# Patient Record
Sex: Female | Born: 1935 | Race: White
Health system: Southern US, Community
[De-identification: ages and names within clinical notes are randomized; demographics above are authoritative.]

## PROBLEM LIST (undated history)

## (undated) DIAGNOSIS — I10 Essential (primary) hypertension: Secondary | ICD-10-CM

## (undated) DIAGNOSIS — J449 Chronic obstructive pulmonary disease, unspecified: Secondary | ICD-10-CM

## (undated) DIAGNOSIS — R030 Elevated blood-pressure reading, without diagnosis of hypertension: Secondary | ICD-10-CM

## (undated) DIAGNOSIS — J189 Pneumonia, unspecified organism: Secondary | ICD-10-CM

## (undated) DIAGNOSIS — I4891 Unspecified atrial fibrillation: Secondary | ICD-10-CM

## (undated) DIAGNOSIS — M1990 Unspecified osteoarthritis, unspecified site: Secondary | ICD-10-CM

## (undated) HISTORY — PX: HYSTERECTOMY: SHX81

## (undated) HISTORY — PX: OTHER SURGICAL HISTORY: SHX169

## (undated) HISTORY — PX: CHOLECYSTECTOMY: SHX55

## (undated) HISTORY — PX: APPENDECTOMY: SHX54

## (undated) HISTORY — PX: ABDOMINAL HYSTERECTOMY: SHX81

---

## 1994-06-24 ENCOUNTER — Ambulatory Visit
Admission: RE | Admit: 1994-06-24 | Disposition: A | Discharge status: Home / Self Care | Payer: Self-pay | Source: Ambulatory Visit | Admitting: Specialist

## 2003-11-04 ENCOUNTER — Ambulatory Visit
Admit: 2003-11-04 | Disposition: A | Discharge status: Home / Self Care | Payer: Self-pay | Source: Ambulatory Visit | Admitting: Internal Medicine

## 2004-11-30 ENCOUNTER — Ambulatory Visit
Admit: 2004-11-30 | Disposition: A | Discharge status: Home / Self Care | Payer: Self-pay | Source: Ambulatory Visit | Admitting: Internal Medicine

## 2005-06-18 ENCOUNTER — Ambulatory Visit (HOSPITAL_COMMUNITY): Admission: RE | Admit: 2005-06-18 | Discharge: 2005-06-19 | Payer: Self-pay | Admitting: General Surgery

## 2005-07-14 ENCOUNTER — Emergency Department: Admit: 2005-07-14 | Payer: Self-pay | Source: Emergency Department | Admitting: Emergency Medicine

## 2005-07-14 LAB — COMPREHENSIVE METABOLIC PANEL
ALT: 25 U/L (ref 4–36)
AST (SGOT): 23 U/L (ref 10–41)
Albumin/Globulin Ratio: 1.6 (ref 1.1–1.8)
Albumin: 4.2 G/DL (ref 3.4–4.9)
Alkaline Phosphatase: 88 U/L (ref 43–112)
BUN: 22 MG/DL — ABNORMAL HIGH (ref 7–21)
Bilirubin, Total: 0.3 MG/DL (ref 0.2–1.0)
CO2: 18 MEQ/L — ABNORMAL LOW (ref 22–31)
Calcium: 9.6 MG/DL (ref 8.6–10.2)
Chloride: 103 MEQ/L (ref 98–107)
Creatinine: 1 MG/DL (ref 0.5–1.4)
Globulin: 2.7 G/DL (ref 2.0–3.7)
Glucose: 150 MG/DL — ABNORMAL HIGH (ref 65–110)
Potassium: 3.3 MEQ/L — ABNORMAL LOW (ref 3.6–5.0)
Protein, Total: 6.9 G/DL (ref 6.0–8.0)
Sodium: 139 MEQ/L (ref 136–143)

## 2005-07-14 LAB — TROPONIN I: Troponin I: <0.08 NG/ML

## 2005-07-14 LAB — CBC WITH AUTO DIFFERENTIAL CERNER
Basophils Absolute: 0.1 /mm3 (ref 0.0–0.2)
Basophils: 1 % (ref 0–2)
Eosinophils Absolute: 0.2 /mm3 (ref 0.0–0.7)
Eosinophils: 3 % (ref 0–5)
Granulocytes Absolute: 2.7 /mm3 (ref 1.8–8.1)
Hematocrit: 38.2 % (ref 37.0–47.0)
Hgb: 13.1 G/DL (ref 12.0–16.0)
Lymphocytes Absolute: 3.8 /mm3 (ref 0.5–4.4)
Lymphocytes: 51 % — ABNORMAL HIGH (ref 15–41)
MCH: 31.3 PG (ref 28.0–32.0)
MCHC: 34.3 G/DL (ref 32.0–36.0)
MCV: 91 FL (ref 80.0–100.0)
MPV: 7.3 FL — ABNORMAL LOW (ref 7.4–10.4)
Monocytes Absolute: 0.7 /mm3 (ref 0.0–1.2)
Monocytes: 10 % (ref 0–11)
Neutrophils %: 36 % — ABNORMAL LOW (ref 52–75)
Platelets: 424 /mm3 — ABNORMAL HIGH (ref 140–400)
RBC: 4.2 /mm3 (ref 4.20–5.40)
RDW: 13.1 % (ref 11.5–15.0)
WBC: 7.5 /mm3 (ref 3.5–10.8)

## 2005-07-14 LAB — URINALYSIS WITH MICROSCOPIC
Bilirubin, UA: NEGATIVE
Blood, UA: NEGATIVE
Glucose, UA: NEGATIVE
Ketones UA: NEGATIVE
Leukocyte Esterase, UA: NEGATIVE
Nitrite, UA: NEGATIVE
Protein, UR: NEGATIVE
Specific Gravity UA POCT: 1.02 (ref <=1.030)
Urine pH: 5.5 (ref 5.0–8.0)
Urobilinogen, UA: 0.2

## 2005-07-14 LAB — CKMB MASS CERNER: CKMB Mass: 1.8 ng/mL (ref 0.0–5.0)

## 2005-07-14 LAB — CREATINE KINASE W/O REFLEX (SOFT): Creatine Kinase (CK): 130 U/L (ref 20–140)

## 2005-07-14 LAB — PT/INR
PT INR: 0.8 {INR} — ABNORMAL LOW (ref 0.9–1.1)
PT: 9.5 s — ABNORMAL LOW (ref 10.8–13.3)

## 2005-07-14 LAB — APTT: PTT: 22 s (ref 21–32)

## 2005-07-29 ENCOUNTER — Ambulatory Visit
Admit: 2005-07-29 | Disposition: A | Discharge status: Home / Self Care | Payer: Self-pay | Source: Ambulatory Visit | Admitting: Orthopaedic Surgery

## 2005-08-11 ENCOUNTER — Ambulatory Visit
Admit: 2005-08-11 | Disposition: A | Discharge status: Home / Self Care | Payer: Self-pay | Source: Ambulatory Visit | Admitting: Orthopaedic Surgery

## 2005-08-25 ENCOUNTER — Ambulatory Visit
Admit: 2005-08-25 | Disposition: A | Discharge status: Home / Self Care | Payer: Self-pay | Source: Ambulatory Visit | Admitting: Anesthesiology

## 2005-08-27 ENCOUNTER — Ambulatory Visit
Admit: 2005-08-27 | Disposition: A | Discharge status: Home / Self Care | Payer: Self-pay | Source: Ambulatory Visit | Admitting: Urology

## 2005-08-27 LAB — CREATININE, SERUM: Creatinine: 0.7 MG/DL (ref 0.5–1.4)

## 2005-08-27 LAB — BUN: BUN: 13 MG/DL (ref 7–21)

## 2005-09-03 ENCOUNTER — Ambulatory Visit
Admit: 2005-09-03 | Disposition: A | Discharge status: Home / Self Care | Payer: Self-pay | Source: Ambulatory Visit | Admitting: Anesthesiology

## 2006-12-22 ENCOUNTER — Ambulatory Visit
Admit: 2006-12-22 | Disposition: A | Discharge status: Home / Self Care | Payer: Self-pay | Source: Ambulatory Visit | Admitting: Gynecologic Oncology

## 2007-03-20 ENCOUNTER — Ambulatory Visit
Admit: 2007-03-20 | Disposition: A | Discharge status: Home / Self Care | Payer: Self-pay | Source: Ambulatory Visit | Admitting: Internal Medicine

## 2007-03-20 LAB — CBC WITH AUTO DIFFERENTIAL CERNER
Basophils Absolute: 0 /mm3 (ref 0.0–0.2)
Basophils: 0 % (ref 0–2)
Eosinophils Absolute: 0.2 /mm3 (ref 0.0–0.2)
Eosinophils: 3 % (ref 0–5)
Granulocytes Absolute: 3.3 /mm3 (ref 1.8–8.1)
Hematocrit: 37.2 % (ref 37.0–47.0)
Hgb: 12.9 G/DL (ref 12.0–16.0)
Lymphocytes Absolute: 2.1 /mm3 (ref 0.5–4.4)
Lymphocytes: 35 % (ref 15–41)
MCH: 30.5 PG (ref 28.0–32.0)
MCHC: 34.7 G/DL (ref 32.0–36.0)
MCV: 88 FL (ref 80.0–100.0)
MPV: 7.5 FL (ref 7.4–10.4)
Monocytes Absolute: 0.4 /mm3 (ref 0.0–1.2)
Monocytes: 7 % (ref 0–11)
Neutrophils %: 54 % (ref 52–75)
Platelets: 378 /mm3 (ref 140–400)
RBC: 4.22 /mm3 (ref 4.20–5.40)
RDW: 13.2 % (ref 11.5–15.0)
WBC: 6.1 /mm3 (ref 3.5–10.8)

## 2007-03-20 LAB — LIPID STUDY FASTING AM CERNER
Cholesterol / HDL Ratio: 3.17 RATIO (ref ?–4.47)
Cholesterol: 187 mg/dL (ref 100–199)
HDL: 59 mg/dL — ABNORMAL LOW (ref >65)
LDL: 101 mg/dL (ref ?–130)
Triglycerides: 134 md/dL (ref 10–190)
VLDL: 27 mg/dL (ref 0–40)

## 2007-03-20 LAB — COMPREHENSIVE METABOLIC PANEL - AH CERNER
ALT: 22 U/L (ref 0–31)
AST (SGOT): 21 U/L (ref 0–31)
Albumin/Globulin Ratio: 1.5 (ref 1.1–2.2)
Albumin: 4 g/dL (ref 3.4–4.8)
Alkaline Phosphatase: 74 U/L (ref 35–104)
Anion Gap: 5 mEq/L (ref 5–15)
BUN: 16 mg/dL (ref 8–23)
Bilirubin, Total: 0.4 mg/dL (ref 0.0–1.0)
CA: 4.4 mEq/L (ref 3.8–4.6)
CO2: 29.3 mEq/L (ref 23.0–32.5)
Calcium: 9.7 mg/dL (ref 8.8–10.2)
Chloride: 106 mEq/L (ref 96–108)
Creatinine: 0.7 mg/dL (ref 0.4–1.1)
Globulin: 2.7 g/dL (ref 2.0–3.6)
Glucose: 96 mg/dL
Osmolality Calculated: 291 mosm/kg (ref 282–298)
Potassium: 5.2 mEq/L — ABNORMAL HIGH (ref 3.3–5.1)
Protein, Total: 6.7 g/dL (ref 6.4–8.3)
Sodium: 140 mEq/L (ref 133–145)
UN/CREA SOFT: 23 RATIO (ref 6–33)

## 2007-03-20 LAB — PT/INR
PT INR: 1 {INR} (ref 0.9–1.1)
PT: 11.5 s (ref 10.8–13.3)

## 2007-03-20 LAB — T4: T4: 7.5 ug/dL (ref 5.6–11.0)

## 2007-03-20 LAB — HEMOLYSIS INDEX: Hemolysis Index: 0 Units

## 2007-03-20 LAB — TSH: TSH: 5.23 u[IU]/mL — ABNORMAL HIGH (ref 0.400–4.610)

## 2007-03-20 LAB — SEDIMENTATION RATE: Sed Rate: 3 MM/HR (ref 0–20)

## 2007-03-20 LAB — GFR

## 2007-12-07 ENCOUNTER — Ambulatory Visit: Admit: 2007-12-07 | Disposition: A | Discharge status: Home / Self Care | Payer: Self-pay | Source: Ambulatory Visit

## 2008-03-15 ENCOUNTER — Ambulatory Visit
Admit: 2008-03-15 | Disposition: A | Discharge status: Home / Self Care | Payer: Self-pay | Source: Ambulatory Visit | Admitting: Gastroenterology

## 2008-06-05 ENCOUNTER — Ambulatory Visit: Admission: RE | Admit: 2008-06-05 | Payer: Self-pay | Source: Ambulatory Visit | Admitting: Gastroenterology

## 2011-04-23 LAB — ECG 12-LEAD
Atrial Rate: 59 {beats}/min
P Axis: 58 degrees
P-R Interval: 178 ms
Q-T Interval: 422 ms
QRS Duration: 90 ms
QTC Calculation (Bezet): 414 ms
R Axis: 47 degrees
T Axis: 32 degrees
Ventricular Rate: 58 {beats}/min

## 2011-04-30 NOTE — Op Note (Signed)
Account Number: [id number]      Document ID: [id number]      Admit Date: 06/05/2008      Procedure Date: 06/05/2008            Patient Location: [facility]      Patient Type: A            SURGEON: Ena Dawley MD      ASSISTANT:                  PREOPERATIVE DIAGNOSIS:      Persistent diarrhea.  Patient never had colonoscopy.            POSTOPERATIVE DIAGNOSES:      Diverticulosis coli, diminutive cecal polyp.            TITLE OF PROCEDURE:      Complete colonoscopy with hot biopsy polypectomy.            ANESTHESIA:      Dr. Ardelle Anton.            COMPLICATIONS:      None.            INFORMED CONSENT:      Obtained prior to the procedure.  I discussed with the patient in detail      indications, techniques of endoscopic procedure.  She understands well and      consents.            PROCEDURE DESCRIPTION:      The patient was placed in the left lateral decubitus position.  A rectal      examination was done which showed internal hemorrhoids.  The patient had      excellent preparation.  Cecal withdrawal time was 7 minutes and colonoscope      was then advanced into the cecum without any difficulty.            Cecum was fully intubated and appeared to have normal appendiceal opening      and ileocecal valve.  A diminutive 3-mm polyp was seen in the cecum.  This      was excised with the help of hot biopsy forceps.  Cautery setting used was      15 watts.            Ascending colon, hepatic and splenic flexures, transverse colon, and      descending colon were all normal.            A small few diverticula were seen in the sigmoid colon.  There was no      evidence of any acute diverticulitis.  Examination of the rectum revealed      internal hemorrhoids.  No complications occurred.            RECOMMENDATIONS:      Regular diet, follow up on the biopsies, continue with Zocor.                        Electronic Signing Provider      _______________________________     Date/Time Signed: _____________      Ena Dawley MD  [phone])            D:  06/05/2008 09:54 AM by Ena Dawley, MD [phone])      T:  06/05/2008 10:37 AM by [id number]          (Conf: [id number]) (Doc ID: [id number])  ZO:[email or web address] Revonda Standard MD

## 2011-08-25 ENCOUNTER — Ambulatory Visit: Payer: Self-pay

## 2011-08-25 ENCOUNTER — Ambulatory Visit
Admission: RE | Admit: 2011-08-25 | Payer: Self-pay | Source: Ambulatory Visit | Attending: Gastroenterology | Admitting: Gastroenterology

## 2012-03-15 ENCOUNTER — Inpatient Hospital Stay
Admission: RE | Admit: 2012-03-15 | Discharge: 2012-03-15 | Disposition: A | Discharge status: Home / Self Care | Payer: Medicare Other | Source: Ambulatory Visit | Attending: Gastroenterology | Admitting: Gastroenterology

## 2012-03-15 DIAGNOSIS — R197 Diarrhea, unspecified: Secondary | ICD-10-CM | POA: Insufficient documentation

## 2012-03-15 LAB — STOOL FOR WBC
Eosinophils Wright Stain: NONE SEEN
Lymphocytes Wright Stain: NONE SEEN
Neutrophils Wright Stain: NONE SEEN
RBC Wright Stain: NONE SEEN

## 2012-03-22 LAB — STOOL OVA AND PARASITE SMEAR

## 2013-05-09 ENCOUNTER — Other Ambulatory Visit: Payer: Self-pay | Admitting: Otolaryngology

## 2013-05-09 DIAGNOSIS — H9190 Unspecified hearing loss, unspecified ear: Secondary | ICD-10-CM

## 2013-05-10 ENCOUNTER — Other Ambulatory Visit: Payer: Self-pay | Admitting: Otolaryngology

## 2013-05-10 ENCOUNTER — Ambulatory Visit
Admission: RE | Admit: 2013-05-10 | Discharge: 2013-05-10 | Disposition: A | Discharge status: Home / Self Care | Payer: Medicare Other | Source: Ambulatory Visit | Attending: Otolaryngology | Admitting: Otolaryngology

## 2013-05-10 DIAGNOSIS — H9190 Unspecified hearing loss, unspecified ear: Secondary | ICD-10-CM

## 2013-05-10 LAB — POCT CREATININE STAT SENSOR (AH)
Creatinine POCT: 0.87 mg/dL (ref ?–1.2)
GFR POCT: >60 mL/min/{1.73_m2} (ref 60–?)
Reference Range: NORMAL
Reference Range: NORMAL

## 2013-05-10 MED ORDER — GADOBUTROL 1 MMOL/ML IV SOLN
6.00 mL | Freq: Once | INTRAVENOUS | Status: AC | PRN
Start: 2013-05-10 — End: 2013-05-10
  Administered 2013-05-10: 6 mmol via INTRAVENOUS

## 2013-08-29 ENCOUNTER — Other Ambulatory Visit: Payer: Self-pay | Admitting: Cardiovascular Disease

## 2013-08-29 DIAGNOSIS — I4891 Unspecified atrial fibrillation: Secondary | ICD-10-CM

## 2013-09-04 ENCOUNTER — Ambulatory Visit
Admission: RE | Admit: 2013-09-04 | Discharge: 2013-09-04 | Disposition: A | Discharge status: Home / Self Care | Payer: Medicare Other | Source: Ambulatory Visit | Attending: Cardiovascular Disease | Admitting: Cardiovascular Disease

## 2013-09-04 DIAGNOSIS — I4891 Unspecified atrial fibrillation: Secondary | ICD-10-CM | POA: Insufficient documentation

## 2013-09-04 DIAGNOSIS — I059 Rheumatic mitral valve disease, unspecified: Secondary | ICD-10-CM | POA: Insufficient documentation

## 2013-09-04 DIAGNOSIS — I2789 Other specified pulmonary heart diseases: Secondary | ICD-10-CM | POA: Insufficient documentation

## 2013-09-04 DIAGNOSIS — I517 Cardiomegaly: Secondary | ICD-10-CM | POA: Insufficient documentation

## 2014-03-18 ENCOUNTER — Emergency Department: Payer: Medicare Other

## 2014-03-18 ENCOUNTER — Emergency Department
Admission: EM | Admit: 2014-03-18 | Discharge: 2014-03-18 | Disposition: A | Discharge status: Home / Self Care | Payer: Medicare Other | Attending: Emergency Medicine | Admitting: Emergency Medicine

## 2014-03-18 DIAGNOSIS — M778 Other enthesopathies, not elsewhere classified: Secondary | ICD-10-CM

## 2014-03-18 DIAGNOSIS — M65849 Other synovitis and tenosynovitis, unspecified hand: Secondary | ICD-10-CM | POA: Insufficient documentation

## 2014-03-18 DIAGNOSIS — M65839 Other synovitis and tenosynovitis, unspecified forearm: Secondary | ICD-10-CM | POA: Insufficient documentation

## 2014-03-18 DIAGNOSIS — M19039 Primary osteoarthritis, unspecified wrist: Secondary | ICD-10-CM | POA: Insufficient documentation

## 2014-03-18 DIAGNOSIS — M19032 Primary osteoarthritis, left wrist: Secondary | ICD-10-CM

## 2014-03-18 HISTORY — DX: Unspecified atrial fibrillation: I48.91

## 2014-03-18 HISTORY — DX: Unspecified osteoarthritis, unspecified site: M19.90

## 2014-03-18 MED ORDER — ACETAMINOPHEN-CODEINE #3 300-30 MG PO TABS
2.00 | ORAL_TABLET | ORAL | Status: DC | PRN
Start: 2014-03-18 — End: 2014-03-18

## 2014-03-18 MED ORDER — ACETAMINOPHEN-CODEINE #3 300-30 MG PO TABS
2.00 | ORAL_TABLET | Freq: Once | ORAL | Status: AC
Start: 2014-03-18 — End: 2014-03-18
  Administered 2014-03-18: 2 via ORAL
  Filled 2014-03-18: qty 2

## 2014-03-18 MED ORDER — ACETAMINOPHEN-CODEINE 300-30 MG PO TABS
1.00 | ORAL_TABLET | Freq: Four times a day (QID) | ORAL | Status: DC | PRN
Start: 2014-03-18 — End: 2016-01-16

## 2014-03-18 NOTE — ED Provider Notes (Signed)
EMERGENCY DEPARTMENT HISTORY AND PHYSICAL EXAM     Physician/Midlevel provider first contact with patient: 03/18/14 2041         Date: 03/18/2014  Patient Name: Cynthia Kim    History of Presenting Illness     Chief Complaint   Patient presents with   . Wrist Pain       History Provided By:Pt     Chief Complaint: Hand and wrist pain   Onset: x 1 week, worse x 1 day    Timing: Constant    Location: Left hand and wrist   Quality: Swollen   Severity: Moderate   Modifying Factors: Worse with movement, min relief with ASA  Associated Symptoms: None mentioned     Additional History: Cynthia Kim is a 80 y.o.left-handed female with pmhx of arthritis and afib c/o left wrist and hand pain x 1 week with swelling and worsening pain x 1 day. Pt reports pain and swelling worsened after doing yard work today Pt denies any fall, trauma to hand or insect bites or stings. She took 8 ASA for pain with min relief.     PCP: Pcp, [facility], MD      No current facility-administered medications for this encounter.     Current Outpatient Prescriptions   Medication Sig Dispense Refill   . aspirin 325 MG tablet Take 325 mg by mouth daily.     . digoxin (LANOXIN) 0.25 MG tablet Take 250 mcg by mouth.     . estrogens, conjugated, (PREMARIN) 0.3 MG tablet Take 0.3 mg by mouth daily. Take daily for 21 days then do not take for 7 days.     . metoprolol (TOPROL-XL) 100 MG 24 hr tablet Take 100 mg by mouth daily.     . sertraline (ZOLOFT) 25 MG tablet Take 25 mg by mouth daily.     . Acetaminophen-Codeine (TYLENOL/CODEINE #3) 300-30 MG per tablet Take 1-2 tablets by mouth every 6 (six) hours as needed for Pain. 20 tablet 0       Past History     Past Medical History:  Past Medical History   Diagnosis Date   . Atrial fibrillation    . Arthritis        Past Surgical History:  History reviewed. No pertinent past surgical history.    Family History:  History reviewed. No pertinent family history.    Social History:  History   Substance Use Topics    . Smoking status: Never Smoker    . Smokeless tobacco: Not on file   . Alcohol Use: No       Allergies:  No Known Allergies    Review of Systems     Review of Systems   Musculoskeletal: Negative for falls.        +left wrist/hand pain with swelling    Neurological: Negative for dizziness and tingling.         Physical Exam   BP 176/72 mmHg  Pulse 71  Temp(Src) 97.5 F (36.4 C)  Resp 18  Ht 1.626 m  Wt 54.432 kg  BMI 20.59 kg/m2  SpO2 98%    Physical Exam   Constitutional: She is oriented to person, place, and time. She appears well-developed and well-nourished. No distress.   HENT:   Head: Normocephalic and atraumatic.   Eyes: Conjunctivae and EOM are normal. Pupils are equal, round, and reactive to light.   Neck: Normal range of motion. Neck supple.   Musculoskeletal: She exhibits edema and tenderness.  Left wrist: She exhibits decreased range of motion, tenderness, bony tenderness and swelling. She exhibits no crepitus, no deformity and no laceration.        Arms:  No snuffbox tenderness.    Neurological: She is alert and oriented to person, place, and time.   Skin: Skin is warm and dry.   Psychiatric: She has a normal mood and affect. Her behavior is normal. Judgment and thought content normal.   Nursing note and vitals reviewed.        Diagnostic Study Results     Labs -     Results     ** No results found for the last 24 hours. **          Radiologic Studies -   Radiology Results (24 Hour)     Procedure Component Value Units Date/Time    Wrist Left PA Lateral and Oblique [[id number]] Collected:  03/18/14 2107    Order Status:  Completed Updated:  03/18/14 2113    Narrative:      History: Wrist pain    PA and lateral and 2 oblique views demonstrate no acute fracture. There  is advanced degenerative arthritis of the scaphotrapezial and first  carpometacarpal joint as well as multifocal osteoporosis of the hand.  There is widening of the articulation of the triquetrum and pisiform  bones. This may be  on a chronic basis or due to an acute joint injury    There is osteopenia      Impression:       No detectable acute fracture. Nonspecific widening of the  triquetral-pisiform articulation. Osteopenia. Advanced multifocal  osteoarthritis of the hand and wrist appear    Laurena Slimmer, MD   03/18/2014 9:09 PM        .      Medical Decision Making   I am the first provider for this patient.    I reviewed the vital signs, available nursing notes, past medical history, past surgical history, family history and social history.    Vital Signs-Reviewed the patient's vital signs.     Patient Vitals for the past 12 hrs:   BP Temp Pulse Resp   03/18/14 2048 176/72 mmHg 97.5 F (36.4 C) 71 18       Pulse Oximetry Analysis - Normal 98% on RA    Old Medical Records: Nursing notes.     ED Course: 9:16 PM - Counseled pt on discharge instructions, f/u plans with orthopedic specialist and return precautions. She agrees with plan, is amenable to discharge and will be discharged to home.     Provider Notes:     Diagnosis     Clinical Impression:   1. Primary osteoarthritis of left wrist    2. Tendonitis of wrist, left        Disposition:   ED Disposition     Discharge Cynthia Kim discharge to home/self care.    Condition at disposition: Stable            _______________________________    Attestations:  This note is prepared by Latrelle Dodrill acting as Scribe for [employer], [place].  Dario Ave, [place].  The scribe's documentation has been prepared under my direction and personally reviewed by me in its entirety.  I confirm that the note above accurately reflects all work, treatment, procedures, and medical decision making performed by me.    _______________________________          Fransisca Connors, PA  03/18/14 2130  Elliot Cousin, MD  03/18/14 2200

## 2014-03-18 NOTE — ED Notes (Signed)
See quick triage

## 2014-03-18 NOTE — Discharge Instructions (Signed)
Return to the ER if worse.  Take 2 over the counter tablets of Ibuprofen every 8 hours as needed for pain.  Please call and see Dr. Davina Poke this week.  Wear the splint for support and comfort.       Tendonitis    You have been diagnosed with tendonitis.    Tendons are strong bands of connective tissue that attach muscles to bone. "Itis" means inflammation. Tendonitis is when the tendon and its coverings get irritated and inflamed (swollen). The cause is often, but not always, using one muscle group over and over and too much.    Tendonitis is treated with pain medicine. It is sometimes treated with a splint. This keeps the injured tendon from moving. Additional treatments include Resting, Icing, Compressing, and Elevating the injured area. Remember this as "RICE."   REST: Limit the use of the injured body part.   ICE: By applying ice to the affected area, swelling and pain can be reduced. Place some ice cubes in a re-sealable (Ziploc) bag and add some water. Put a thin washcloth between the bag and the skin. Apply the ice bag to the area for at least 20 minutes. Do this at least 4 times per day. Using the ice for longer times and more frequently is OK. NEVER APPLY ICE DIRECTLY TO THE SKIN.   COMPRESS: Compression means to apply pressure around the injured area such as with a splint, cast or an ACE bandage. Compression decreases swelling and improves comfort. Compression should be tight enough to relieve swelling but not so tight as to decrease circulation. Increasing pain, numbness, tingling, or change in skin color, are all signs of decreased circulation.   ELEVATE: Elevate the injured part. For example, a painful arm can be elevated by placing the arm in a sling while awake and propped up on pillows while lying down.    YOU SHOULD SEEK MEDICAL ATTENTION IMMEDIATELY, EITHER HERE OR AT THE NEAREST EMERGENCY DEPARTMENT, IF ANY OF THE FOLLOWING OCCURS:   Severe increase in pain or swelling in the  injured area.   New numbness or tingling in or below the injured area.   A cold, pale foot/hand that seems to have blood supply problems develops.   A fever (temperature higher than 100.39F / 38C), chills or increased redness over the affected area develops.    Osteoarthritis     You were diagnosed with osteoarthritis.    Osteoarthritis (OA) is a type of problem with the joints where they start to suffer from wear and tear, as you get older. It is also called "degenerative arthritis" or "degenerative joint disease."    Normally, there is a tough, rubbery layer of cartilage on the ends of each of the bones that makes up a joint. The cartilage is there to protect the bone and to provide a cushioned, slippery surface. This way, the bones can move smoothly when the joint is bending. When the cartilage gets worn, the bone ends are not as well protected. As the exposed bones rub against each other when the joint bends, it causes irritation, pain and swelling. Any joint can be affected. However, OA is most common in the spine, hands, hips, knees, and feet.     OA is more common as a person gets older. Aging itself does not cause OA. However, changes in the body that happen when one gets older make the chance of getting OA more likely.     Other things that can speed up  the development of OA are:     Repetitive activity that involves constantly stressing the joints.   Being overweight. This puts more wear and tear on the joints.   Family history of OA. OA tends to run in families.   Fractures that extend into the joint and cause injury to the cartilage surfaces.     Symptoms of OA can include:   Joint pain. This is usually made worse with heavy activity and repetitive bending.   Stiffness in the joint when bending and moving the joint. This is usually worse in the morning. It gets better with activity as the joint "warms up."   Swelling. Joint swelling can happen when fluid seeps into the joint from  chronic irritation.   Crackling and popping noises when the joint is moved.   Lumps of enlarged bone that can develop on the fingers.    There is no cure for OA. The treatment of OA is designed to control pain and slow down the wear and tear process. Some things that are used to treat OA include:     Pain medications: Acetaminophen (Tylenol) and medications called NSAIDS like ibuprofen (Advil/Motrin) are often used first for pain control.     Steroid injections: This can cause temporary relief of pain. However, repeated injections into the joints can lead to more joint problems.     Life style changes: This includes weight loss and getting involved in exercise programs to help strengthen muscles around painful joints without making the wear and tear worse.     Physical therapy (PT): This may help to build strength and decrease joint stiffness.     In more severe cases, joint replacement surgery may be an option if symptoms can't be controlled using conventional measures.    There does NOT seem to be any evidence that "alternative treatments" like vitamins (A, E, and C), glucosamine, ginger, chondroitin sulfate and others have any helpful effects. ALWAYS talk to your doctor if you are going to try any alternative treatments.     Take all prescribed medications as directed. Unless you are told something different by your doctors, you can take over-the-counter pain medicines. These medicines can include ibuprofen (Advil/Motrin) or acetaminophen (Tylenol). Follow the directions on the package.    Get involved in any physical therapy recommended by your doctor.    YOU SHOULD SEEK MEDICAL ATTENTION IMMEDIATELY, EITHER HERE OR AT THE NEAREST EMERGENCY DEPARTMENT, IF ANY OF THE FOLLOWING OCCUR:   You have any of the above symptoms and are unable to see or contact your doctor.   You get a fever (temperature higher than 100.68F or 38C) that is associated with pain or redness over your affected  joint.   You have severe swelling in your painful joint.    If you can t follow up with your doctor, or if at any time you feel you need to be rechecked or seen again, come back here or go to the nearest emergency department.

## 2014-08-29 DIAGNOSIS — I10 Essential (primary) hypertension: Secondary | ICD-10-CM | POA: Diagnosis not present

## 2014-08-29 DIAGNOSIS — Z6824 Body mass index (BMI) 24.0-24.9, adult: Secondary | ICD-10-CM | POA: Diagnosis not present

## 2014-08-29 DIAGNOSIS — R7309 Other abnormal glucose: Secondary | ICD-10-CM | POA: Diagnosis not present

## 2014-08-29 DIAGNOSIS — E780 Pure hypercholesterolemia: Secondary | ICD-10-CM | POA: Diagnosis not present

## 2014-08-29 DIAGNOSIS — G4700 Insomnia, unspecified: Secondary | ICD-10-CM | POA: Diagnosis not present

## 2014-09-12 DIAGNOSIS — R7309 Other abnormal glucose: Secondary | ICD-10-CM | POA: Diagnosis not present

## 2014-09-12 DIAGNOSIS — I10 Essential (primary) hypertension: Secondary | ICD-10-CM | POA: Diagnosis not present

## 2014-11-25 ENCOUNTER — Emergency Department
Admission: EM | Admit: 2014-11-25 | Discharge: 2014-11-25 | Disposition: A | Discharge status: Home / Self Care | Payer: Medicare Other | Attending: Emergency Medicine | Admitting: Emergency Medicine

## 2014-11-25 ENCOUNTER — Emergency Department: Payer: Medicare Other

## 2014-11-25 DIAGNOSIS — S0101XA Laceration without foreign body of scalp, initial encounter: Secondary | ICD-10-CM

## 2014-11-25 DIAGNOSIS — I4891 Unspecified atrial fibrillation: Secondary | ICD-10-CM | POA: Insufficient documentation

## 2014-11-25 DIAGNOSIS — W01198A Fall on same level from slipping, tripping and stumbling with subsequent striking against other object, initial encounter: Secondary | ICD-10-CM | POA: Insufficient documentation

## 2014-11-25 DIAGNOSIS — Z7982 Long term (current) use of aspirin: Secondary | ICD-10-CM | POA: Insufficient documentation

## 2014-11-25 DIAGNOSIS — Y92009 Unspecified place in unspecified non-institutional (private) residence as the place of occurrence of the external cause: Secondary | ICD-10-CM | POA: Insufficient documentation

## 2014-11-25 DIAGNOSIS — S0990XA Unspecified injury of head, initial encounter: Secondary | ICD-10-CM

## 2014-11-25 HISTORY — DX: Unspecified atrial fibrillation: I48.91

## 2014-11-25 MED ORDER — TETANUS-DIPHTH-ACELL PERTUSSIS 5-2.5-18.5 LF-MCG/0.5 IM SUSP
0.5000 mL | Freq: Once | INTRAMUSCULAR | Status: AC
Start: 2014-11-25 — End: 2014-11-25
  Administered 2014-11-25: 0.5 mL via INTRAMUSCULAR
  Filled 2014-11-25: qty 0.5

## 2014-11-25 NOTE — ED Provider Notes (Signed)
EMERGENCY DEPARTMENT HISTORY AND PHYSICAL EXAM     Physician/Midlevel provider first contact with patient: 11/25/14 2010         Date: 11/25/2014  Patient Name: Cynthia Kim    History of Presenting Illness     Chief Complaint   Patient presents with   . Laceration   . Fall       History Provided By: Patient    Chief Complaint: Laceration   Onset: 15 minutes ago  Timing: Suddenly  Location: Right frontal scalp  Quality: Bleeding Controlled   Severity: Moderate  Modifying Factors: None  Associated Symptoms: No LOC, dizziness, neck pain, numbness or weakness in extremities or vision changes     Additional History: Cynthia Kim is a 81 y.o. female with a PMHx of atrial fibrillation. She presents to the ED with a laceration to left frontal scalp s/p fall. Pt reports that she tripped over a low wooden bench in her home, and fell forwards, hitting her head on the edge of a wooden doll house. Pt denies LOC, dizziness, neck pain, numbness or weakness in extremities or vision changes. Pt denies blood thinners, but does take 325mg  ASA daily. She states that her Tetanus is not UTD.       PCP: Pcp, Noneorunknown, MD    No current facility-administered medications for this encounter.     Current Outpatient Prescriptions   Medication Sig Dispense Refill   . aspirin EC 325 MG EC tablet Take 325 mg by mouth daily.     . Azelastine-Fluticasone (DYMISTA NA) by Nasal route.     . Calcium Citrate (CITRACAL PO) Take by mouth.     . Cholecalciferol (VITAMIN D) 1000 UNIT tablet Take 1,000 Units by mouth daily.     . digoxin (LANOXIN) 0.25 MG tablet Take 250 mcg by mouth.     . Estrogens Conjugated (PREMARIN PO) Take by mouth.     . metoprolol (LOPRESSOR) 100 MG tablet Take 100 mg by mouth 2 (two) times daily.     . metoprolol XL (TOPROL-XL) 50 MG 24 hr tablet Take 50 mg by mouth daily.         Past History     Past Medical History:  Past Medical History   Diagnosis Date   . A-fib        Past Surgical History:  Past Surgical History    Procedure Laterality Date   . Hysterectomy         Family History:  History reviewed. No pertinent family history.    Social History:  History   Substance Use Topics   . Smoking status: Never Smoker    . Smokeless tobacco: Not on file   . Alcohol Use: No       Allergies:  No Known Allergies    Review of Systems     Review of Systems   Eyes: Negative for blurred vision and double vision.   Musculoskeletal: Negative for neck pain.   Skin:        +laceration to right frontal scalp   Neurological: Negative for dizziness, tingling, loss of consciousness and weakness.        No numbness   Endo/Heme/Allergies:        NKDA   Psychiatric/Behavioral: Negative for suicidal ideas.       Physical Exam   BP 156/67 mmHg  Pulse 77  Temp(Src) 97.7 F (36.5 C)  Resp 18  Ht 5' 4.8" (1.646 m)  Wt 56.7 kg  BMI 20.93 kg/m2  SpO2 96%    Physical Exam   Constitutional: She is oriented to person, place, and time and well-developed, well-nourished, and in no distress. No distress.   HENT:   Head: Normocephalic and atraumatic.   3 cm laceration, left frontal scalp   Eyes: Conjunctivae and EOM are normal. Pupils are equal, round, and reactive to light. Right eye exhibits no discharge. Left eye exhibits no discharge. No scleral icterus.   Neck:   No c-spine tenderness   Cardiovascular: Normal rate, regular rhythm, normal heart sounds and intact distal pulses.    Pulmonary/Chest: Effort normal and breath sounds normal. No respiratory distress. She has no wheezes. She has no rales.   Abdominal: Soft. Bowel sounds are normal. She exhibits no distension. There is no tenderness. There is no rebound and no guarding.   Musculoskeletal: She exhibits no edema.   No t or l spine tenderness   Neurological: She is alert and oriented to person, place, and time. GCS score is 15.   Clear and fluent speech  No facial asymmetry  No pronator drift  maew   Skin: Skin is warm and dry. She is not diaphoretic.   Psychiatric: Mood and affect normal.        Diagnostic Study Results     Labs -     Results     ** No results found for the last 24 hours. **          Radiologic Studies -   Radiology Results (24 Hour)     ** No results found for the last 24 hours. **      .      Medical Decision Making   I am the first provider for this patient.    I reviewed the vital signs, available nursing notes, past medical history, past surgical history, family history and social history.    Vital Signs-Reviewed the patient's vital signs.     Patient Vitals for the past 12 hrs:   BP Temp Pulse Resp   11/25/14 1959 156/67 mmHg 97.7 F (36.5 C) 77 18       Pulse Oximetry Analysis - Normal 96% on RA    Old Medical Records: Old medical records. Nursing notes.     ED Course:     8:20 PM -  Discussed with patient and husband the plan for in the ER including wound care and laceration repair. They are agreeable.     8:48 PM -  Repaired laceration with staples. Advised pt not to wash her hair for 24 hours and to be gentle when washing in the future. Discussed return precautions (severe headache, vomiting, numbness, weakness or any other new sxs). Pt will return in 10-14 days for staple removal. Answered all questions for pt, who verbalized understanding and agrees. She will follow up with her PCP. Pt is stable and agreeable for discharge.     Provider Notes:      Procedures:    ------------------- PROCEDURE: LACERATION REPAIR  --------------------    Performed by the emergency provider  Consent:  Informed consent, after discussion of the risks, benefits, and alternatives to the procedure, was obtained  Location:  Scalp  Length: 3 cm  Complexity: Simple  Description: clean wound edges, no foreign bodies  Distal CMS:  Normal.  No deficits.  Neurovascularly intact.  Anesthesia: Lidocaine 1% with epinephrine,  2 cc  Preparation: The wound was cleaned with sureclens and irrigated with normal saline.  The area was prepped and draped in  the usual sterile fashion.   Exploration:   The wound was  explored and no foreign bodies were found.  Procedure: The skin was closed with staples.  There was good approximation.  In total, 5 were used.  Post-Procedure:  Good closure and hemostasis.  The patient tolerated the procedure well and there were no complications.  CSM remains intact.  Post procedure dressing applied by RN.      Diagnosis     Clinical Impression:   1. Scalp laceration, initial encounter        _______________________________    Attestations: This note is prepared by Alferd Apa acting as scribe for Tomasa Rand, MD    Tomasa Rand, MD- The scribe's documentation has been prepared under my direction and personally reviewed by me in its entirety.  I confirm that the note above accurately reflects all work, treatment, procedures, and medical decision making performed by me.    _______________________________          Elliot Cousin, MD  11/26/14 [phone]

## 2014-11-25 NOTE — Discharge Instructions (Signed)
Laceration, Scalp    You have been seen today and treated for a scalp laceration (cut).    The wound has been repaired with surgical staples.    Follow up with your doctor OR come back here or go to the nearest Emergency Department for staple/suture removal in:   10 -14 days    Use the following wound care instructions:   Keep the wound clean and dry for the next 24 hours. Avoid excessive moisture. You can wash the wound gently with soap and water and Then keep the area as clean and dry as possible.   DO NOT let your wound to soak in water (bathing in a bathtub, using a hot tub or going swimming, for example).   You can shower. However, be careful not to rub your staples/stitches too hard (do not be too abrasive).   Let the wound dry before putting another bandage on (if using a bandage).   To help take off a scab, cleanse the area with a half hydrogen peroxide/half water mix. This will also help [place] to take out the staples/sutures when they are ready to be taken out.   Unless you were told not to, you can put a thin layer of antibiotic ointment over the wound. You can buy Polysporin, Bacitracin, or Neosporin over the counter (without a prescription). Neosporin can sometimes irritate the skin. If this happens, stop using it and switch to another topical antibiotic.    Keep the affected area elevated for the next 24 hours to lower swelling and pain. You may also want to put ice on the area. Put some ice cubes in a re-sealable (Ziploc) bag and add some water. Put a thin washcloth between the bag and the skin. Apply the ice bag to the area for at least 20 minutes. Do this at least 4 times per day. Longer times and more often are OK. NEVER APPLY ICE DIRECTLY TO THE SKIN.    If you had a local anesthetic, it will wear off in about 2 hours. Until then, be careful not to hurt yourself because of less feeling in the area.    YOU SHOULD SEEK MEDICAL ATTENTION IMMEDIATELY, EITHER HERE OR AT THE NEAREST  EMERGENCY DEPARTMENT, IF ANY OF THE FOLLOWING OCCURS:   Unusual redness or swelling.   Red streaks or red patches around the wound.   The wound smells bad or has a lot of drainage.   Fever (temperature higher than 100.43F / 38C), chills, increasing pain or swelling.       Minor Head Injury    You have been diagnosed with a minor head injury.    Although you DID have trauma to your head, you do not appear to have a serious brain injury. You DID NOT suffer a concussion. A concussion is a slightly more severe form of head injury. A concussion is often, but not always, associated with a loss of consciousness.    X-rays and CT scans are unnecessary since you did not have serious trauma or significant loss of consciousness.     Treatment includes observation at home and pain medication like acetaminophen (Tylenol) or ibuprofen (Advil or Motrin). Prescription pain medication is probably not needed.    You might have a mild headache for a few days.    Over the next 24 hours:   Stay with family or friends who can watch your behavior.   Avoid alcohol or drugs.    YOU SHOULD SEEK MEDICAL ATTENTION IMMEDIATELY, EITHER  HERE OR AT THE NEAREST EMERGENCY DEPARTMENT, IF ANY OF THE FOLLOWING OCCURS:   Your headache is severe or gets worse.   You feel numbness, tingling, or weakness in your arms or legs.   You have a fever (temperature higher than 100.88F / 38C), neck pain, vision changes, difficulty walking or change of behavior.   You vomit.   You are confused and have difficulty waking from sleep.

## 2014-11-25 NOTE — ED Notes (Signed)
Pt in bedroom, tripped over a stool after turning. Denies LOC, laceration on forrehead with swelling to site. No blood thinners. Injury 15 min PTA. Dressing applied.

## 2014-12-23 DIAGNOSIS — Z1389 Encounter for screening for other disorder: Secondary | ICD-10-CM | POA: Diagnosis not present

## 2014-12-23 DIAGNOSIS — I10 Essential (primary) hypertension: Secondary | ICD-10-CM | POA: Diagnosis not present

## 2014-12-23 DIAGNOSIS — E780 Pure hypercholesterolemia: Secondary | ICD-10-CM | POA: Diagnosis not present

## 2014-12-23 DIAGNOSIS — Z9181 History of falling: Secondary | ICD-10-CM | POA: Diagnosis not present

## 2014-12-23 DIAGNOSIS — G4700 Insomnia, unspecified: Secondary | ICD-10-CM | POA: Diagnosis not present

## 2015-06-23 DIAGNOSIS — R7303 Prediabetes: Secondary | ICD-10-CM | POA: Diagnosis not present

## 2015-06-23 DIAGNOSIS — Z139 Encounter for screening, unspecified: Secondary | ICD-10-CM | POA: Diagnosis not present

## 2015-06-23 DIAGNOSIS — E7800 Pure hypercholesterolemia, unspecified: Secondary | ICD-10-CM | POA: Diagnosis not present

## 2015-06-23 DIAGNOSIS — I10 Essential (primary) hypertension: Secondary | ICD-10-CM | POA: Diagnosis not present

## 2015-06-23 DIAGNOSIS — Z1389 Encounter for screening for other disorder: Secondary | ICD-10-CM | POA: Diagnosis not present

## 2015-12-22 DIAGNOSIS — Z79899 Other long term (current) drug therapy: Secondary | ICD-10-CM | POA: Diagnosis not present

## 2015-12-22 DIAGNOSIS — G4700 Insomnia, unspecified: Secondary | ICD-10-CM | POA: Diagnosis not present

## 2015-12-22 DIAGNOSIS — I10 Essential (primary) hypertension: Secondary | ICD-10-CM | POA: Diagnosis not present

## 2015-12-22 DIAGNOSIS — Z1389 Encounter for screening for other disorder: Secondary | ICD-10-CM | POA: Diagnosis not present

## 2015-12-22 DIAGNOSIS — M1710 Unilateral primary osteoarthritis, unspecified knee: Secondary | ICD-10-CM | POA: Diagnosis not present

## 2016-01-15 ENCOUNTER — Other Ambulatory Visit: Payer: Self-pay | Admitting: Otolaryngology

## 2016-01-15 DIAGNOSIS — M7989 Other specified soft tissue disorders: Secondary | ICD-10-CM

## 2016-01-15 DIAGNOSIS — M25432 Effusion, left wrist: Secondary | ICD-10-CM

## 2016-01-16 ENCOUNTER — Observation Stay: Payer: Medicare Other | Admitting: Internal Medicine

## 2016-01-16 ENCOUNTER — Emergency Department: Payer: Medicare Other

## 2016-01-16 ENCOUNTER — Emergency Department
Admission: EM | Admit: 2016-01-16 | Discharge: 2016-01-16 | Disposition: A | Discharge status: Home / Self Care | Payer: Medicare Other | Attending: Emergency Medicine | Admitting: Emergency Medicine

## 2016-01-16 ENCOUNTER — Observation Stay
Admission: EM | Admit: 2016-01-16 | Discharge: 2016-01-17 | Disposition: A | Discharge status: Home / Self Care | Payer: Medicare Other | Attending: Internal Medicine | Admitting: Internal Medicine

## 2016-01-16 DIAGNOSIS — Z7901 Long term (current) use of anticoagulants: Secondary | ICD-10-CM | POA: Insufficient documentation

## 2016-01-16 DIAGNOSIS — H8110 Benign paroxysmal vertigo, unspecified ear: Secondary | ICD-10-CM | POA: Insufficient documentation

## 2016-01-16 DIAGNOSIS — E785 Hyperlipidemia, unspecified: Secondary | ICD-10-CM | POA: Insufficient documentation

## 2016-01-16 DIAGNOSIS — Z79899 Other long term (current) drug therapy: Secondary | ICD-10-CM | POA: Insufficient documentation

## 2016-01-16 DIAGNOSIS — Z7982 Long term (current) use of aspirin: Secondary | ICD-10-CM | POA: Insufficient documentation

## 2016-01-16 DIAGNOSIS — M25532 Pain in left wrist: Secondary | ICD-10-CM | POA: Insufficient documentation

## 2016-01-16 DIAGNOSIS — I482 Chronic atrial fibrillation: Secondary | ICD-10-CM | POA: Insufficient documentation

## 2016-01-16 DIAGNOSIS — I517 Cardiomegaly: Secondary | ICD-10-CM | POA: Insufficient documentation

## 2016-01-16 DIAGNOSIS — S0003XA Contusion of scalp, initial encounter: Secondary | ICD-10-CM | POA: Insufficient documentation

## 2016-01-16 DIAGNOSIS — M25432 Effusion, left wrist: Secondary | ICD-10-CM | POA: Insufficient documentation

## 2016-01-16 DIAGNOSIS — R55 Syncope and collapse: Principal | ICD-10-CM | POA: Insufficient documentation

## 2016-01-16 DIAGNOSIS — W1830XA Fall on same level, unspecified, initial encounter: Secondary | ICD-10-CM | POA: Insufficient documentation

## 2016-01-16 DIAGNOSIS — M1990 Unspecified osteoarthritis, unspecified site: Secondary | ICD-10-CM | POA: Insufficient documentation

## 2016-01-16 DIAGNOSIS — I4891 Unspecified atrial fibrillation: Secondary | ICD-10-CM | POA: Insufficient documentation

## 2016-01-16 DIAGNOSIS — I493 Ventricular premature depolarization: Secondary | ICD-10-CM | POA: Insufficient documentation

## 2016-01-16 DIAGNOSIS — E860 Dehydration: Secondary | ICD-10-CM | POA: Insufficient documentation

## 2016-01-16 DIAGNOSIS — S0990XA Unspecified injury of head, initial encounter: Secondary | ICD-10-CM

## 2016-01-16 DIAGNOSIS — I083 Combined rheumatic disorders of mitral, aortic and tricuspid valves: Secondary | ICD-10-CM | POA: Insufficient documentation

## 2016-01-16 DIAGNOSIS — I10 Essential (primary) hypertension: Secondary | ICD-10-CM | POA: Insufficient documentation

## 2016-01-16 DIAGNOSIS — M19049 Primary osteoarthritis, unspecified hand: Secondary | ICD-10-CM | POA: Insufficient documentation

## 2016-01-16 LAB — TROPONIN I
Troponin I: <0.01 ng/mL (ref 0.00–0.09)
Troponin I: 0.01 ng/mL (ref 0.00–0.09)

## 2016-01-16 LAB — PT/INR
PT INR: 1 (ref 0.9–1.1)
PT: 13.4 s (ref 12.6–15.0)

## 2016-01-16 LAB — CBC AND DIFFERENTIAL
Absolute NRBC: 0 10*3/uL
Basophils Absolute Automated: 0.03 10*3/uL (ref 0.00–0.20)
Basophils Absolute Automated: 0.02 10*3/uL (ref 0.00–0.20)
Basophils Automated: 0.4 %
Basophils Automated: 0.2 %
Eosinophils Absolute Automated: 0.36 10*3/uL (ref 0.00–0.70)
Eosinophils Absolute Automated: 0.22 10*3/uL (ref 0.00–0.70)
Eosinophils Automated: 4.7 %
Eosinophils Automated: 2.1 %
Hematocrit: 39.3 % (ref 37.0–47.0)
Hematocrit: 39.2 % (ref 37.0–47.0)
Hgb: 13.5 g/dL (ref 12.0–16.0)
Hgb: 13 g/dL (ref 12.0–16.0)
Immature Granulocytes Absolute: 0.04 10*3/uL
Immature Granulocytes: 0.4 %
Lymphocytes Absolute Automated: 2.76 10*3/uL (ref 0.50–4.40)
Lymphocytes Absolute Automated: 2.48 10*3/uL (ref 0.50–4.40)
Lymphocytes Automated: 36 %
Lymphocytes Automated: 23.4 %
MCH: 29.6 pg (ref 28.0–32.0)
MCH: 28.6 pg (ref 28.0–32.0)
MCHC: 34.4 g/dL (ref 32.0–36.0)
MCHC: 33.2 g/dL (ref 32.0–36.0)
MCV: 86.2 fL (ref 80.0–100.0)
MCV: 86.3 fL (ref 80.0–100.0)
MPV: 9 fL — ABNORMAL LOW (ref 9.4–12.3)
MPV: 9.4 fL (ref 9.4–12.3)
Monocytes Absolute Automated: 1.01 10*3/uL (ref 0.00–1.20)
Monocytes Absolute Automated: 1.18 10*3/uL (ref 0.00–1.20)
Monocytes: 13.2 %
Monocytes: 11.1 %
Neutrophils Absolute: 3.51 10*3/uL (ref 1.80–8.10)
Neutrophils Absolute: 6.68 10*3/uL (ref 1.80–8.10)
Neutrophils: 45.7 %
Neutrophils: 62.8 %
Nucleated RBC: 0 /100 WBC (ref 0.0–1.0)
Platelets: 358 10*3/uL (ref 140–400)
Platelets: 372 10*3/uL (ref 140–400)
RBC: 4.56 10*6/uL (ref 4.20–5.40)
RBC: 4.54 10*6/uL (ref 4.20–5.40)
RDW: 14 % (ref 12–15)
RDW: 13 % (ref 12–15)
WBC: 7.67 10*3/uL (ref 3.50–10.80)
WBC: 10.62 10*3/uL (ref 3.50–10.80)

## 2016-01-16 LAB — COMPREHENSIVE METABOLIC PANEL
ALT: 14 U/L (ref 0–55)
ALT: 15 U/L (ref 0–55)
AST (SGOT): 15 U/L (ref 5–34)
AST (SGOT): 22 U/L (ref 5–34)
Albumin/Globulin Ratio: 1.1 (ref 0.9–2.2)
Albumin/Globulin Ratio: 1.1 (ref 0.9–2.2)
Albumin: 3.8 g/dL (ref 3.5–5.0)
Albumin: 3.8 g/dL (ref 3.5–5.0)
Alkaline Phosphatase: 93 U/L (ref 37–106)
Alkaline Phosphatase: 94 U/L (ref 37–106)
Anion Gap: 12 (ref 5.0–15.0)
Anion Gap: 12 (ref 5.0–15.0)
BUN: 16 mg/dL (ref 7.0–19.0)
BUN: 18 mg/dL (ref 7.0–19.0)
Bilirubin, Total: 1.1 mg/dL (ref 0.2–1.2)
Bilirubin, Total: 1 mg/dL (ref 0.2–1.2)
CO2: 24 mEq/L (ref 22–29)
CO2: 27 mEq/L (ref 22–29)
Calcium: 9.5 mg/dL (ref 7.9–10.2)
Calcium: 9.8 mg/dL (ref 7.9–10.2)
Chloride: 103 mEq/L (ref 100–111)
Chloride: 99 mEq/L — ABNORMAL LOW (ref 100–111)
Creatinine: 0.8 mg/dL (ref 0.6–1.0)
Creatinine: 0.9 mg/dL (ref 0.6–1.0)
Globulin: 3.5 g/dL (ref 2.0–3.6)
Globulin: 3.6 g/dL (ref 2.0–3.6)
Glucose: 119 mg/dL — ABNORMAL HIGH (ref 70–100)
Glucose: 110 mg/dL — ABNORMAL HIGH (ref 70–100)
Potassium: 3.8 mEq/L (ref 3.5–5.1)
Potassium: 4.7 mEq/L (ref 3.5–5.1)
Protein, Total: 7.3 g/dL (ref 6.0–8.3)
Protein, Total: 7.4 g/dL (ref 6.0–8.3)
Sodium: 139 mEq/L (ref 136–145)
Sodium: 138 mEq/L (ref 136–145)

## 2016-01-16 LAB — URINALYSIS, REFLEX TO MICROSCOPIC EXAM IF INDICATED
Bilirubin, UA: NEGATIVE
Blood, UA: NEGATIVE
Glucose, UA: NEGATIVE
Ketones UA: NEGATIVE
Leukocyte Esterase, UA: NEGATIVE
Nitrite, UA: NEGATIVE
Protein, UR: NEGATIVE
Specific Gravity UA: 1.018 (ref 1.001–1.035)
Urine pH: 6 (ref 5.0–8.0)
Urobilinogen, UA: NEGATIVE mg/dL

## 2016-01-16 LAB — C-REACTIVE PROTEIN: C-Reactive Protein: 3.6 mg/dL — ABNORMAL HIGH (ref 0.0–0.8)

## 2016-01-16 LAB — GFR
EGFR: >60
EGFR: 60

## 2016-01-16 LAB — SEDIMENTATION RATE: Sed Rate: 14 mm/Hr (ref 0–20)

## 2016-01-16 LAB — HEMOLYSIS INDEX: Hemolysis Index: 36 — ABNORMAL HIGH (ref 0–18)

## 2016-01-16 MED ORDER — SODIUM CHLORIDE 0.9 % IV BOLUS
500.0000 mL | Freq: Once | INTRAVENOUS | Status: AC
Start: 2016-01-16 — End: 2016-01-16
  Administered 2016-01-16: 500 mL via INTRAVENOUS

## 2016-01-16 MED ORDER — DIGOXIN 125 MCG PO TABS
0.1250 mg | ORAL_TABLET | Freq: Every day | ORAL | Status: DC
Start: 2016-01-17 — End: 2016-01-17
  Administered 2016-01-17: 0.125 mg via ORAL
  Filled 2016-01-16: qty 1

## 2016-01-16 MED ORDER — NALOXONE HCL 0.4 MG/ML IJ SOLN (WRAP)
0.2000 mg | INTRAMUSCULAR | Status: DC | PRN
Start: 2016-01-16 — End: 2016-01-17

## 2016-01-16 MED ORDER — ACETAMINOPHEN 325 MG PO TABS
650.0000 mg | ORAL_TABLET | Freq: Four times a day (QID) | ORAL | Status: DC | PRN
Start: 2016-01-16 — End: 2016-01-17

## 2016-01-16 MED ORDER — MECLIZINE HCL 12.5 MG PO TABS
12.5000 mg | ORAL_TABLET | Freq: Three times a day (TID) | ORAL | Status: DC | PRN
Start: 2016-01-16 — End: 2016-01-17
  Filled 2016-01-16 (×3): qty 1

## 2016-01-16 MED ORDER — METOPROLOL TARTRATE 50 MG PO TABS
50.0000 mg | ORAL_TABLET | Freq: Two times a day (BID) | ORAL | Status: DC
Start: 2016-01-16 — End: 2016-01-17
  Administered 2016-01-17: 50 mg via ORAL
  Filled 2016-01-16 (×2): qty 1

## 2016-01-16 MED ORDER — SERTRALINE HCL 50 MG PO TABS
25.0000 mg | ORAL_TABLET | Freq: Every day | ORAL | Status: DC
Start: 2016-01-17 — End: 2016-01-17
  Administered 2016-01-17: 25 mg via ORAL
  Filled 2016-01-16: qty 1

## 2016-01-16 MED ORDER — ASPIRIN 325 MG PO TBEC
325.0000 mg | DELAYED_RELEASE_TABLET | Freq: Every day | ORAL | Status: DC
Start: 2016-01-17 — End: 2016-01-17
  Administered 2016-01-17: 325 mg via ORAL
  Filled 2016-01-16: qty 1

## 2016-01-16 MED ORDER — SODIUM CHLORIDE 0.9 % IV SOLN
INTRAVENOUS | Status: DC
Start: 2016-01-16 — End: 2016-01-17

## 2016-01-16 MED ORDER — NAPROXEN 250 MG PO TABS
250.0000 mg | ORAL_TABLET | Freq: Two times a day (BID) | ORAL | Status: DC
Start: 2016-01-16 — End: 2020-05-19

## 2016-01-16 MED ORDER — HYDROCODONE-ACETAMINOPHEN 5-325 MG PO TABS
1.0000 | ORAL_TABLET | Freq: Four times a day (QID) | ORAL | Status: DC | PRN
Start: 2016-01-16 — End: 2017-10-27

## 2016-01-16 MED ORDER — NAPROXEN 250 MG PO TABS
250.0000 mg | ORAL_TABLET | Freq: Two times a day (BID) | ORAL | Status: DC
Start: 2016-01-17 — End: 2016-01-17
  Administered 2016-01-17: 250 mg via ORAL
  Filled 2016-01-16 (×3): qty 1

## 2016-01-16 NOTE — Discharge Instructions (Signed)
Arthralgia    You have been diagnosed with Arthralgia.    Arthralgia means pain and stiffness of the joints. People often describe the pain as aching or throbbing. Arthralgia can affect one or more joints. It can be caused by many types of conditions and/or injuries. Often, arthralgia lasts for a long time and people need treatment over months or years. Some causes of arthralgia are:     Infection with a virus.   Many types of infections that are starting to improve. When recovering from infection, sometimes there is joint pain.    Autoimmune diseases (where the body attacks itself). Examples are Lupus or Rheumatoid Arthritis.   Inflammation of the tendons or the fluid-filled sacs (bursa) surrounding your joints.   Low thyroid function.   Depression.     You might need another exam or more tests to find out why you have arthralgias. At this time, the cause of your symptoms does not seem dangerous. You do not need to stay in the hospital.    We don't believe your condition is dangerous right now. However, you need to be careful. Sometimes a problem that seems small can get serious later. This is why it is very important to come back here or go to the nearest Emergency Department unless you are much improved.    Clues that joint pain is dangerous are:     Hot and swollen joints. This may mean they are infected.   Fever (Temperature higher than 100.4F or 38C), weight loss and feeling very ill can be symptoms of severe infection (sepsis).   Severe pain, weakness or numbness (loss of feeling).    Some things you can try at home are:     Over-the-counter pain medications.   Heating pads and warm baths.   Physical therapy.    Follow the instructions for any medication you get prescribed.     Have a close follow-up with your primary care doctor.    YOU SHOULD SEEK MEDICAL ATTENTION IMMEDIATELY, EITHER HERE OR AT THE NEAREST EMERGENCY DEPARTMENT, IF ANY OF THE FOLLOWING OCCURS:     You have a  fever (temperature higher than 100.4F or 38C).   Your pain does not go away or gets worse.   The joint that hurts turns red and/or gets swollen.   You suddenly can t walk.   You don t feel better after treatment or feel you re getting worse.   You get any other symptoms, concerns, or don t get better as expected.    If you can t follow up with your doctor, or if at any time you feel you need to be rechecked or seen again, come back here or go to the nearest emergency department.

## 2016-01-16 NOTE — ED Notes (Signed)
Pulled muscle in left shoulder, having PT for shoulder and yesterday morning woke up with swollen left wrist. Pain to touch and some redness noted. CMS intact. Reports having vertigo but not here for that today. On Meclizine and tramadol

## 2016-01-16 NOTE — ED Notes (Addendum)
See at the [facility] this am for wrist swelling,. Discharged, came home, "passed out", was vomiting, hit head. Has had dizziness and vertigo for the past week

## 2016-01-16 NOTE — H&P (Signed)
[facility]  Office phone: [phone]   [place] [id number] or [phone]        ADMISSION HISTORY AND PHYSICAL EXAM    Date Time: 01/16/2016 10:13 PM  Patient Name: Cynthia Kim  Attending Physician: Christophe Louis, DO      Chief Complaint: Syncope    History of Present Illness:   Cynthia Kim is a 82 y.o. female with history of atrial fibrillation, benign positional vertigo, arthritis who presents to the hospital with syncopal episode that happened in the bathroom after vomiting, patient remembers getting up from the floor and was able to get back to her bed.  She denies preceding chest pain, shortness of breath or lightheadedness.  She was recently diagnosed with vertigo and was prescribed meclizine and Epley maneuver which seems to be helpful.  She was also prescribed narcotics and naproxen for left wrist pain which could have exacerbated her emesis.  She is sustained hematoma on the forehead from her syncope/fall.  Physical exam hematoma on the forehead, irregularly irregular rhythm, dry mucous membranes, no nystagmus, no focal neurological deficit.  CT of the head was negative except for hematoma.  Laboratory testing unremarkable, EKG in atrial fibrillation with controlled ventricular rate.  She is admitted for further evaluation of syncope.    Past Medical History:     Past Medical History   Diagnosis Date   . Atrial fibrillation    . Arthritis    . A-fib        Review of Systems:       A ten point review of systems was performed and was negative except for the positives mentioned above.      Past Surgical History:     Past Surgical History   Procedure Laterality Date   . Hysterectomy         Family History:   No family history on file.    Social History:     Social History     Social History   . Marital Status: Married     Spouse Name: N/A   . Number of Children: N/A   . Years of Education: N/A     Social History Main Topics   . Smoking status: Never Smoker    . Smokeless tobacco: Not  on file   . Alcohol Use: No   . Drug Use: No   . Sexual Activity: Not on file     Other Topics Concern   . Not on file     Social History Narrative    ** Merged History Encounter **            Allergies:   No Known Allergies    Medications:     Prescriptions prior to admission   Medication Sig   . aspirin EC 325 MG EC tablet Take 325 mg by mouth daily.   . Azelastine-Fluticasone (DYMISTA NA) by Nasal route.   . Cholecalciferol (VITAMIN D) 1000 UNIT tablet Take 1,000 Units by mouth daily.   . digoxin (LANOXIN) 0.25 MG tablet Take 250 mcg by mouth.   . digoxin (LANOXIN) 0.25 MG tablet Take 250 mcg by mouth.   Marland Kitchen HYDROcodone-acetaminophen (NORCO) 5-325 MG per tablet Take 1 tablet by mouth every 6 (six) hours as needed.   . meclizine (ANTIVERT) 12.5 MG tablet Take 25 mg by mouth 3 (three) times daily as needed.   . metoprolol (LOPRESSOR) 100 MG tablet Take 100 mg by mouth 2 (two) times daily.   . metoprolol (TOPROL-XL)  100 MG 24 hr tablet Take 100 mg by mouth daily.   . naproxen (NAPROSYN) 250 MG tablet Take 1 tablet (250 mg total) by mouth 2 (two) times daily with meals.   . sertraline (ZOLOFT) 25 MG tablet Take 25 mg by mouth daily.   . traMADol (ULTRAM) 50 MG tablet Take 50 mg by mouth every 6 (six) hours as needed for Pain.     Current Facility-Administered Medications   Medication Dose Route Frequency Provider Last Rate Last Dose   . 0.9%  NaCl infusion   Intravenous Continuous Eric Form, MD       . acetaminophen (TYLENOL) tablet 650 mg  650 mg Oral 4X Daily PRN Eric Form, MD       . Melene Muller ON 01/17/2016] aspirin EC EC tablet 325 mg  325 mg Oral Daily Eric Form, MD       . Melene Muller ON 01/17/2016] digoxin (LANOXIN) tablet 0.125 mg  0.125 mg Oral Daily , Bonnielee Haff, MD       . meclizine (ANTIVERT) tablet 12.5 mg  12.5 mg Oral TID PRN Eric Form, MD       . metoprolol (LOPRESSOR) tablet 50 mg  50 mg Oral BID Eric Form, MD       . naloxone [facility]) injection 0.2 mg  0.2 mg Intravenous PRN  Eric Form, MD       . Melene Muller ON 01/17/2016] naproxen (NAPROSYN) tablet 250 mg  250 mg Oral BID Meals Eric Form, MD       . Melene Muller ON 01/17/2016] sertraline (ZOLOFT) tablet 25 mg  25 mg Oral Daily Eric Form, MD           Physical Exam:     Filed Vitals:    01/16/16 2146   BP: 159/59   Pulse: 71   Temp:    Resp: 16   SpO2: 93%       General appearance - alert and in no distress  Head - Normocephalic, atraumatic  Eyes - pupils equal and reactive, extraocular eye movements intact  Nose - normal and patent, no erythema, discharge or polyps  Mouth - mucous membranes moist, pharynx normal without lesions.  Neck - supple, no thyromegaly, JVD  Lymphatics - no palpable lymphadenopathy  Chest - clear to auscultation, no wheezes, rales or rhonchi, symmetric air entry  Heart - normal rate, regular rhythm, normal S1, S2, no murmurs, rubs, clicks or gallops  Abdomen - soft, nontender, nondistended, no masses or organomegaly  Musculoskeletal - no joint tenderness, deformity or swelling  Extremities - peripheral pulses normal, no pedal edema, no clubbing or cyanosis  Skin - normal coloration and turgor, no rashes, no suspicious skin lesions noted  Neurological - alert, oriented, cranial nerves 2-12 intact, No Extremity weakness     Labs:     Results     Procedure Component Value Units Date/Time    Troponin I [[id number]] Collected:  01/16/16 1947    Specimen Information:  Blood Updated:  01/16/16 2012     Troponin I 0.01 ng/mL     Narrative:      Please repeat    UA, Reflex to Microscopic [[id number]] Collected:  01/16/16 1938    Specimen Information:  Urine Updated:  01/16/16 2004     Urine Type Clean Catch      Color, UA Yellow      Clarity, UA Clear      Specific Gravity UA 1.018  Urine pH 6.0      Leukocyte Esterase, UA Negative      Nitrite, UA Negative      Protein, UR Negative      Glucose, UA Negative      Ketones UA Negative      Urobilinogen, UA Negative mg/dL      Bilirubin, UA Negative      Blood, UA  Negative     Troponin I [[id number]] Collected:  01/16/16 1640    Specimen Information:  Blood Updated:  01/16/16 1720     Troponin I <0.01 ng/mL     Comprehensive Metabolic Panel (CMP) [[id number]]  (Abnormal) Collected:  01/16/16 1640    Specimen Information:  Blood Updated:  01/16/16 1716     Glucose 110 (H) mg/dL      BUN 56.2 mg/dL      Creatinine 0.9 mg/dL      Sodium 130 mEq/L      Potassium 4.7 mEq/L      Chloride 99 (L) mEq/L      CO2 27 mEq/L      Calcium 9.8 mg/dL      Protein, Total 7.4 g/dL      Albumin 3.8 g/dL      AST (SGOT) 22 U/L      ALT 15 U/L      Alkaline Phosphatase 94 U/L      Bilirubin, Total 1.0 mg/dL      Globulin 3.6 g/dL      Albumin/Globulin Ratio 1.1      Anion Gap 12.0     Hemolysis index [[id number]]  (Abnormal) Collected:  01/16/16 1640     Hemolysis Index 36 (H) Updated:  01/16/16 1716    GFR [[id number]] Collected:  01/16/16 1640     EGFR 60.0 Updated:  01/16/16 1716    Protime-INR [[id number]] Collected:  01/16/16 1640    Specimen Information:  Blood Updated:  01/16/16 1707     PT 13.4 sec      PT INR 1.0      PT Anticoag. Given Within 48 hrs. warfarin ([place]     CBC and differential [[id number]] Collected:  01/16/16 1640    Specimen Information:  Blood from Blood Updated:  01/16/16 1700     WBC 10.62 x10 3/uL      Hgb 13.0 g/dL      Hematocrit 42.5 %      Platelets 372 x10 3/uL      RBC 4.54 x10 6/uL      MCV 86.3 fL      MCH 28.6 pg      MCHC 33.2 g/dL      RDW 13 %      MPV 9.4 fL      Neutrophils 62.8 %      Lymphocytes Automated 23.4 %      Monocytes 11.1 %      Eosinophils Automated 2.1 %      Basophils Automated 0.2 %      Immature Granulocyte 0.4 %      Nucleated RBC 0.0 /100 WBC      Neutrophils Absolute 6.68 x10 3/uL      Abs Lymph Automated 2.48 x10 3/uL      Abs Mono Automated 1.18 x10 3/uL      Abs Eos Automated 0.22 x10 3/uL      Absolute Baso Automated 0.02 x10 3/uL      Absolute Immature Granulocyte 0.04 x10 3/uL      Absolute NRBC  0.00 x10 3/uL             Rads:    Wrist Left Pa Lateral And Oblique    01/16/2016  XR WRIST LEFT 3+ VIEWS CLINICAL INDICATION: pain and swelling COMPARISON: 03/18/2014 FINDINGS:  4 views  were obtained. There is no fracture. The joint alignment is well maintained. Advanced degenerative changes noted affecting primarily the radial side of the carpal bones and the first [facility] joint. These have progressed from the prior study.     01/16/2016   No fracture or acute process . Degenerative changes which have progressed from the prior study. Heron Nay, MD 01/16/2016 7:44 AM     Ct Head Without Contrast    01/16/2016  CLINICAL INDICATION:  Fall, injury, patient is on Coumadin. TECHNIQUE:  Axial noncontrast CT images were obtained from the skull base to the vertex. A combination of a automatic exposure control, adjustment of the mA and/or kV  according to patient size and/or use of iterative reconstruction technique was utilized. COMPARISON:  None available INTERPRETATION: There are several small areas of subcutaneous hematoma overlying the right frontal bone and posterior skull vertex as well as posterior lateral left skull. Underlying bones appear grossly intact. There is no acute intracranial hemorrhage or extra-axial fluid collection. The ventricles have a normal configuration. There is no midline shift or mass effect.     01/16/2016   Multiple areas of subcutaneous hematoma overlying the right frontal bone, and posterior skull. Underlying bones appear intact. There is no acute intracranial hemorrhage or acute intracranial abnormality appreciated. Wyatt Portela, MD 01/16/2016 5:36 PM     Chest Ap Portable    01/16/2016  CLINICAL INDICATION: Weakness COMPARISON: 07/14/2005 INTERPRETATION:   A single frontal view of the chest was obtained. . There is stable cardiomegaly. Pulmonary vasculature is normal. Lungs are clear. There is no focal infiltrate or pleural effusion.     01/16/2016    No acute cardiopulmonary process. Wyatt Portela, MD 01/16/2016 5:26 PM            Assessment:   1. Syncope, likely vasovagal  2. Vertigo, benign positional  3. Dehydration  4. Arthritis of the hands  5. Atrial fibrillation  6. Hypertension    Plan:   1. Hydrate overnight  2. Orthostatic vitals  3. Serial troponin  4. Avoid narcotics  5. Continue meclizine and Epley maneuver for vertigo  6. PT, OT evaluation  7. Cardiologist consulted for V heart    Signed by: Eric Form  Pager: [phone]

## 2016-01-16 NOTE — ED Provider Notes (Signed)
EMERGENCY DEPARTMENT HISTORY AND PHYSICAL EXAM     Physician/Midlevel provider first contact with patient: 01/16/16 1640         Date: 01/16/2016  Patient Name: Cynthia Kim    History of Presenting Illness     Chief Complaint   Patient presents with   . Fall       History Provided By: Patient and Patient's Husband    Chief Complaint: LOC  Onset: 2.5 hours ago  Timing: Suddenly  Location: neuro  Quality: unwitnessed  Severity: Moderate  Exacerbating factors: after starting two meds (one for pain and another for inflammation)  Alleviating factors: none  Associated Symptoms: nausea, vomiting, head injury (3 sites), fall  Pertinent Negatives: CP, abdominal pain, headache, neck pain, back pain, leg pain, acute arm pain    Additional History: Cynthia Kim is a 82 y.o. female with Hx of arthritis and A fib presenting to the ED with a sudden unwitnessed LOC 2.5 hours ago. She states this morning she was prescribed a new pain med and inflammation med for her wrist pain (unable to recall name). 2 hours after taking it, she became extremely nauseated. She was vomiting and leaning forward when she passed out. Denies feeling lightheaded prior and is unable to recall falling. Pt's husband found her getting up after her syncope. No Hx of MI. Only takes ASA for A fib. Denies other blood thinner.     PCP: Cynthia Pedro, MD  SPECIALISTS: Dr. Wynona Kim ([facility])    No current facility-administered medications for this encounter.     Current Outpatient Prescriptions   Medication Sig Dispense Refill   . aspirin EC 325 MG EC tablet Take 325 mg by mouth daily.     . Azelastine-Fluticasone (DYMISTA NA) by Nasal route.     . Cholecalciferol (VITAMIN D) 1000 UNIT tablet Take 1,000 Units by mouth daily.     . digoxin (LANOXIN) 0.25 MG tablet Take 250 mcg by mouth.     . digoxin (LANOXIN) 0.25 MG tablet Take 250 mcg by mouth.     Marland Kitchen HYDROcodone-acetaminophen (NORCO) 5-325 MG per tablet Take 1 tablet by mouth every 6 (six) hours as needed. 10  tablet 0   . meclizine (ANTIVERT) 12.5 MG tablet Take 25 mg by mouth 3 (three) times daily as needed.     . metoprolol (LOPRESSOR) 100 MG tablet Take 100 mg by mouth 2 (two) times daily.     . metoprolol (TOPROL-XL) 100 MG 24 hr tablet Take 100 mg by mouth daily.     . naproxen (NAPROSYN) 250 MG tablet Take 1 tablet (250 mg total) by mouth 2 (two) times daily with meals. 10 tablet 0   . sertraline (ZOLOFT) 25 MG tablet Take 25 mg by mouth daily.     . traMADol (ULTRAM) 50 MG tablet Take 50 mg by mouth every 6 (six) hours as needed for Pain.         Past History     Past Medical History:  Past Medical History   Diagnosis Date   . Atrial fibrillation    . Arthritis    . A-fib        Past Surgical History:  Past Surgical History   Procedure Laterality Date   . Hysterectomy         Family History:  No family history on file.    Social History:  Social History   Substance Use Topics   . Smoking status: Never Smoker    . Smokeless  tobacco: None   . Alcohol Use: No       Allergies:  No Known Allergies    Review of Systems     Review of Systems   Constitutional: Negative for fever and fatigue.   HENT: Negative for rhinorrhea and sore throat.    Eyes: Negative for discharge, redness and visual disturbance.   Respiratory: Negative for cough and shortness of breath.    Cardiovascular: Negative for chest pain and leg swelling.   Gastrointestinal: Positive for nausea and vomiting. Negative for abdominal pain.   Endocrine: Negative for polyuria.   Genitourinary: Negative for dysuria, urgency, frequency and flank pain.   Musculoskeletal: Negative for back pain, neck pain and neck stiffness.   Skin: Negative for rash.   Allergic/Immunologic: Negative for immunocompromised state.   Neurological: Positive for syncope. Negative for light-headedness and headaches.   Hematological: Bruises/bleeds easily.   Psychiatric/Behavioral: Negative for suicidal ideas.     Physical Exam   BP 143/67 mmHg  Pulse 70  Temp(Src) 97.1 F (36.2 C)  (Oral)  Resp 20  Ht 5\' 4"  (1.626 m)  Wt 56.7 kg  BMI 21.45 kg/m2  SpO2 97%    Constitutional: Vital signs reviewed. Well appearing. No distress.  Head: (+) multiple large hematomas to the head, with largest in the upper forehead area  Eyes: Conjunctiva and sclera are normal.  No injection or discharge.  Ears, Nose, Throat:  Normal external examination of the ears. No nasal septum hematoma. No drainage from nose. Mucous membranes moist.  Neck: Normal range of motion. No midline C-spine tenderness. Trachea midline.  Respiratory/Chest: Clear to auscultation. No respiratory distress.   Cardiovascular: Irregularly irregular rate and rhythm. No murmurs.  Abdomen:  Bowel sounds intact. No rebound or guarding. Soft.  Non-tender.  Back: No midline CTLS spine tenderness to palpation. Stable pelvis. No hip tenderness.   Upper Extremity:  No edema. No cyanosis. Bilateral radial pulses intact and equal. No focal tenderness.   Lower Extremity:  No edema. No cyanosis. Bilateral calves symmetrical and non-tender. DP and PT pulses intact and equal in the bilateral lower extremities. No focal tenderness.  Skin: Warm and dry. No rash.  Neuro: A&Ox3. CNII-XII intact to testing. CNII -XII intact to testing. Strength 5/5 and symmetrical in the bilateral upper and lower extremities. Sensation to sharp touch intact and equal in the bilateral upper and lower extremities. Coordination intact to finger to nose testing. Normal gait.   Psychiatric:  Normal affect.  Normal insight.      Diagnostic Study Results     Labs -     Results     Procedure Component Value Units Date/Time    Troponin I [[id number]] Collected:  01/16/16 1947    Specimen Information:  Blood Updated:  01/16/16 2012     Troponin I 0.01 ng/mL     Narrative:      Please repeat    UA, Reflex to Microscopic [[id number]] Collected:  01/16/16 1938    Specimen Information:  Urine Updated:  01/16/16 2004     Urine Type Clean Catch      Color, UA Yellow      Clarity, UA Clear       Specific Gravity UA 1.018      Urine pH 6.0      Leukocyte Esterase, UA Negative      Nitrite, UA Negative      Protein, UR Negative      Glucose, UA Negative      Ketones  UA Negative      Urobilinogen, UA Negative mg/dL      Bilirubin, UA Negative      Blood, UA Negative     Troponin I [[id number]] Collected:  01/16/16 1640    Specimen Information:  Blood Updated:  01/16/16 1720     Troponin I <0.01 ng/mL     Comprehensive Metabolic Panel (CMP) [[id number]]  (Abnormal) Collected:  01/16/16 1640    Specimen Information:  Blood Updated:  01/16/16 1716     Glucose 110 (H) mg/dL      BUN 78.2 mg/dL      Creatinine 0.9 mg/dL      Sodium 956 mEq/L      Potassium 4.7 mEq/L      Chloride 99 (L) mEq/L      CO2 27 mEq/L      Calcium 9.8 mg/dL      Protein, Total 7.4 g/dL      Albumin 3.8 g/dL      AST (SGOT) 22 U/L      ALT 15 U/L      Alkaline Phosphatase 94 U/L      Bilirubin, Total 1.0 mg/dL      Globulin 3.6 g/dL      Albumin/Globulin Ratio 1.1      Anion Gap 12.0     Hemolysis index [[id number]]  (Abnormal) Collected:  01/16/16 1640     Hemolysis Index 36 (H) Updated:  01/16/16 1716    GFR [[id number]] Collected:  01/16/16 1640     EGFR 60.0 Updated:  01/16/16 1716    Protime-INR [[id number]] Collected:  01/16/16 1640    Specimen Information:  Blood Updated:  01/16/16 1707     PT 13.4 sec      PT INR 1.0      PT Anticoag. Given Within 48 hrs. warfarin ([place]     CBC and differential [[id number]] Collected:  01/16/16 1640    Specimen Information:  Blood from Blood Updated:  01/16/16 1700     WBC 10.62 x10 3/uL      Hgb 13.0 g/dL      Hematocrit 03.4 %      Platelets 372 x10 3/uL      RBC 4.54 x10 6/uL      MCV 86.3 fL      MCH 28.6 pg      MCHC 33.2 g/dL      RDW 13 %      MPV 9.4 fL      Neutrophils 62.8 %      Lymphocytes Automated 23.4 %      Monocytes 11.1 %      Eosinophils Automated 2.1 %      Basophils Automated 0.2 %      Immature Granulocyte 0.4 %      Nucleated RBC 0.0 /100 WBC      Neutrophils Absolute 6.68 x10 3/uL       Abs Lymph Automated 2.48 x10 3/uL      Abs Mono Automated 1.18 x10 3/uL      Abs Eos Automated 0.22 x10 3/uL      Absolute Baso Automated 0.02 x10 3/uL      Absolute Immature Granulocyte 0.04 x10 3/uL      Absolute NRBC 0.00 x10 3/uL           Radiologic Studies -   Radiology Results (24 Hour)     Procedure Component Value Units Date/Time    CT Head without Contrast [[id number]] Collected:  01/16/16  1734    Order Status:  Completed Updated:  01/16/16 1740    Narrative:      CLINICAL INDICATION:  Fall, injury, patient is on Coumadin.    TECHNIQUE:  Axial noncontrast CT images were obtained from the skull  base to the vertex. A combination of a automatic exposure control,  adjustment of the mA and/or kV  according to patient size and/or use of  iterative reconstruction technique was utilized.      COMPARISON:  None available    INTERPRETATION: There are several small areas of subcutaneous hematoma  overlying the right frontal bone and posterior skull vertex as well as  posterior lateral left skull. Underlying bones appear grossly intact.  There is no acute intracranial hemorrhage or extra-axial fluid  collection. The ventricles have a normal configuration. There is no  midline shift or mass effect.      Impression:       Multiple areas of subcutaneous hematoma overlying the right  frontal bone, and posterior skull. Underlying bones appear intact. There  is no acute intracranial hemorrhage or acute intracranial abnormality  appreciated.    Wyatt Portela, MD   01/16/2016 5:36 PM      Chest AP Portable [[id number]] Collected:  01/16/16 1723    Order Status:  Completed Updated:  01/16/16 1730    Narrative:      CLINICAL INDICATION: Weakness     COMPARISON: 07/14/2005    INTERPRETATION:   A single frontal view of the chest was obtained. .    There is stable cardiomegaly. Pulmonary vasculature is normal. Lungs are  clear. There is no focal infiltrate or pleural effusion.      Impression:        No acute cardiopulmonary  process.    Wyatt Portela, MD   01/16/2016 5:26 PM        .    Medical Decision Making   I am the first provider for this patient.    I reviewed the vital signs, available nursing notes, past medical history, past surgical history, family history and social history.    Vital Signs-Reviewed the patient's vital signs.     Patient Vitals for the past 12 hrs:   BP Temp Pulse Resp   01/16/16 1832 143/67 mmHg 97.1 F (36.2 C) 70 20   01/16/16 1551 150/68 mmHg 97.9 F (36.6 C) 79 18       Pulse Oximetry Analysis - Normal 95% on RA    Cardiac Monitor:  Rate: 70  Rhythm:  Atrial Fibrillation    EKG:  Interpreted by the EP.   Time Interpreted: 1604   Rate: 77   Rhythm: Atrial Fibrillation   Interpretation: No ST elevation   Comparison: 07/14/2005 - new A fib    Admit Decision Time:  The decision to admit this patient was made by the emergency provider at 2042 on 01/16/2016     Medical records:  Previous records  Nursing notes  Previous EKG  Previous imaging  Previous labs        ED Course:     5:36 PM - Discussed treatment plan with pt. Pt is agreeable with plan.    5:42 PM - Updated on CT result.    8:03 PM - Discussed pt case with Dr. Ferd Glassing, [facility]), who would recommend inpatient admission. Will see her in the morning.    8:40 PM - - Discussed pt case with Dr. Mylo Red, [facility]), who agrees to hospitalization under [facility]  observation on behalf of Dr. Richardson Chiquito.    8:45 PM - Updated pt on results and plan for hospitalization. She is agreeable. No complaints at this time. Repeat neuro exam stable.    Provider Notes: Possible adverse reaction to new medication (norco) or vagal episode. Low suspicion for cardiac etiology of symptoms. Will observe overnight and have cardiology see her in the am.    Diagnosis     Clinical Impression:   1. Syncope and collapse    2. Closed head injury, initial encounter    3. Scalp contusion        Treatment Plan:   ED Disposition     Observation Admitting Physician: Christophe Louis  [[id number]]  Diagnosis: Syncope and collapse [780.2.ICD-9-CM]  Estimated Length of Kim: < 2 midnights  Tentative Discharge Plan?: Home or Self Care [1]  Patient Class: Observation [104]              _______________________________      Attestations: This note is prepared by Georgina Quint, acting as scribe for Lynnea Ferrier, MD.    Lynnea Ferrier, MD - The scribe's documentation has been prepared under my direction and personally reviewed by me in its entirety.  I confirm that the note above accurately reflects all work, treatment, procedures, and medical decision making performed by me.    _______________________________        Maryella Shivers, MD  01/17/16 [phone]

## 2016-01-16 NOTE — ED Provider Notes (Signed)
EMERGENCY DEPARTMENT HISTORY AND PHYSICAL EXAM     Physician/Midlevel provider first contact with patient: 01/16/16 0347         Date: 01/16/2016  Patient Name: Cynthia Kim    History of Presenting Illness     Chief Complaint   Patient presents with   . Joint Swelling       History Provided By: Patient and Patient's Husband    Chief Complaint: Wrist pain  Onset: Yesterday morning  Timing: Worsening  Location: L wrist  Severity: Moderate  Exacerbating factors: Worse with movement  Alleviating factors: None reported  Associated Symptoms: Joint swelling and tingling  Pertinent Negatives: Denies numbness    Additional History: Cynthia Kim is a 82 y.o. female presenting to the ED with worsening left wrist pain beginning yesterday morning with associated tingling of the L thumb and joint swelling. She notes the pain is worse with movement.The pt states she is having physical therapy for a pulled muscle in the left shoulder, but woke up yesterday morning with L wrist swelling. Pt notes she is unsure was has caused the pain. She went to her ENT doctor yesterday for a recent attack of vertigo and the dr advised her to get an ultrasound. Pt reports she made an appointment with a Radiologist for next week, but her sxs worsened over night. Pt states she is left hand dominant. She denies numbness.     PCP: Hayden Pedro, MD    No current facility-administered medications for this encounter.     Current Outpatient Prescriptions   Medication Sig Dispense Refill   . meclizine (ANTIVERT) 12.5 MG tablet Take 25 mg by mouth 3 (three) times daily as needed.     . traMADol (ULTRAM) 50 MG tablet Take 50 mg by mouth every 6 (six) hours as needed for Pain.     Marland Kitchen aspirin EC 325 MG EC tablet Take 325 mg by mouth daily.     . Azelastine-Fluticasone (DYMISTA NA) by Nasal route.     . Cholecalciferol (VITAMIN D) 1000 UNIT tablet Take 1,000 Units by mouth daily.     . digoxin (LANOXIN) 0.25 MG tablet Take 250 mcg by mouth.     . digoxin  (LANOXIN) 0.25 MG tablet Take 250 mcg by mouth.     Marland Kitchen HYDROcodone-acetaminophen (NORCO) 5-325 MG per tablet Take 1 tablet by mouth every 6 (six) hours as needed. 10 tablet 0   . metoprolol (LOPRESSOR) 100 MG tablet Take 100 mg by mouth 2 (two) times daily.     . metoprolol (TOPROL-XL) 100 MG 24 hr tablet Take 100 mg by mouth daily.     . naproxen (NAPROSYN) 250 MG tablet Take 1 tablet (250 mg total) by mouth 2 (two) times daily with meals. 10 tablet 0   . sertraline (ZOLOFT) 25 MG tablet Take 25 mg by mouth daily.         Past History     Past Medical History:  Past Medical History   Diagnosis Date   . Atrial fibrillation    . Arthritis    . A-fib        Past Surgical History:  Past Surgical History   Procedure Laterality Date   . Hysterectomy         Family History:  History reviewed. No pertinent family history.    Social History:  Social History   Substance Use Topics   . Smoking status: Never Smoker    . Smokeless tobacco: None   .  Alcohol Use: No       Allergies:  No Known Allergies    Review of Systems     Review of Systems   Constitutional: Negative for fever.   HENT: Negative for nosebleeds.    Eyes: Negative for redness.   Respiratory: Negative for shortness of breath.    Gastrointestinal: Negative for abdominal pain.   Musculoskeletal: Positive for joint swelling.        (+) L wrist pain   Skin: Negative for rash.   Allergic/Immunologic:        NKDA   Neurological: Negative for numbness.        (+) Tingling of the L thumb   Psychiatric/Behavioral: Negative for suicidal ideas.     Physical Exam   BP 147/74 mmHg  Pulse 63  Temp(Src) 97.5 F (36.4 C) (Oral)  Resp 18  Ht 5\' 4"  (1.626 m)  Wt 56.7 kg  BMI 21.45 kg/m2  SpO2 95%    Physical Exam   Constitutional: She is oriented to person, place, and time. She appears well-developed and well-nourished.   HENT:   Head: Normocephalic.   Mouth/Throat: Oropharynx is clear and moist.   Eyes: Conjunctivae are normal. Pupils are equal, round, and reactive to  light.   Neck: Normal range of motion.   Cardiovascular: Normal rate, regular rhythm and intact distal pulses.    Pulmonary/Chest: Effort normal.   Abdominal: She exhibits no distension.   Musculoskeletal:        Left wrist: She exhibits decreased range of motion (pain with active flexion, mild pain with passive extension), tenderness (ttp over proximal volar wrist, no ttp over flexor tendon sheaths of fingers), bony tenderness and swelling (swelling to volar proximal wrist, mild erythema and warmth).   L wrist without fusiform swelling, no pain on axial loading   Neurological: She is alert and oriented to person, place, and time.   Sensation intact to L hand   Skin: Skin is warm. There is erythema (mild to volar wrist).   Cap refill <3 secs in hand   Psychiatric: She has a normal mood and affect.   Nursing note and vitals reviewed.        Diagnostic Study Results     Labs -     Results     Procedure Component Value Units Date/Time    Comprehensive metabolic panel [[id number]]  (Abnormal) Collected:  01/16/16 0649    Specimen Information:  Blood Updated:  01/16/16 0716     Glucose 119 (H) mg/dL      BUN 82.9 mg/dL      Creatinine 0.8 mg/dL      Sodium 562 mEq/L      Potassium 3.8 mEq/L      Chloride 103 mEq/L      CO2 24 mEq/L      Calcium 9.5 mg/dL      Protein, Total 7.3 g/dL      Albumin 3.8 g/dL      AST (SGOT) 15 U/L      ALT 14 U/L      Alkaline Phosphatase 93 U/L      Bilirubin, Total 1.1 mg/dL      Globulin 3.5 g/dL      Albumin/Globulin Ratio 1.1      Anion Gap 12.0     GFR [[id number]] Collected:  01/16/16 0649     EGFR >60.0 Updated:  01/16/16 0716    Sedimentation rate (ESR) [[id number]] Collected:  01/16/16 0649    Specimen Information:  Blood Updated:  01/16/16 0701     Sed Rate 14 mm/Hr     CBC with differential [[id number]]  (Abnormal) Collected:  01/16/16 0649    Specimen Information:  Blood from Blood Updated:  01/16/16 0656     WBC 7.67 x10 3/uL      Hgb 13.5 g/dL      Hematocrit 59.5 %      Platelets  358 x10 3/uL      RBC 4.56 x10 6/uL      MCV 86.2 fL      MCH 29.6 pg      MCHC 34.4 g/dL      RDW 14 %      MPV 9.0 (L) fL      Neutrophils 45.7 %      Lymphocytes Automated 36.0 %      Monocytes 13.2 %      Eosinophils Automated 4.7 %      Basophils Automated 0.4 %      Immature Granulocyte Unmeasured %      Neutrophils Absolute 3.51 x10 3/uL      Abs Lymph Automated 2.76 x10 3/uL      Abs Mono Automated 1.01 x10 3/uL      Abs Eos Automated 0.36 x10 3/uL      Absolute Baso Automated 0.03 x10 3/uL      Absolute Immature Granulocyte Unmeasured x10 3/uL     C-reactive Protein (CRP) [[id number]] Collected:  01/16/16 0649    Specimen Information:  Blood Updated:  01/16/16 2951          Radiologic Studies -   Radiology Results (24 Hour)     Procedure Component Value Units Date/Time    Wrist Left PA Lateral and Oblique [[id number]] Collected:  01/16/16 0742    Order Status:  Completed Updated:  01/16/16 0748    Narrative:      XR WRIST LEFT 3+ VIEWS  CLINICAL INDICATION: pain and swelling    COMPARISON: 03/18/2014    FINDINGS:  4 views  were obtained. There is no fracture. The joint  alignment is well maintained. Advanced degenerative changes noted  affecting primarily the radial side of the carpal bones and the first  [facility] joint. These have progressed from the prior study.      Impression:       No fracture or acute process . Degenerative changes which  have progressed from the prior study.    Heron Nay, MD   01/16/2016 7:44 AM        .    Medical Decision Making   I am the first provider for this patient.    I reviewed the vital signs, available nursing notes, past medical history, past surgical history, family history and social history.    Vital Signs-Reviewed the patient's vital signs.     Patient Vitals for the past 12 hrs:   BP Temp Pulse Resp   01/16/16 0622 147/74 mmHg 97.5 F (36.4 C) 63 18       Pulse Oximetry Analysis - Normal 95% on RA          Old Medical Records: Nursing notes.     ED Course:     7:53 AM  - Pt agreeable with ED plan    8:07 AM - Discussed results with pt and counseled on diagnosis, f/u plans, medication use, and signs and symptoms when to return to ED.  Pt is stable and ready for discharge.  Provider Notes: 82 yo female with L wrist pain and swelling since yesterday.  No known trauma, is undergoing PT for her L shoulder.  Pain, ttp and swelling over volar wrist.  Mild erythema, but does not appear cellulitic.  No pain with axial loading, do not suspect joint infection.  Some pain with passive extension, but no ttp over digital or hand flexor sheaths, do not suspect infectious tenosynovitis.  Labs reassuring including ESR.  Suspect inflammatory process.  Will add naprosyn, norco prn and close hand surgery follow-up.  Discussed strict returns.    Diagnosis     Clinical Impression:   1. Left wrist pain    2. Wrist swelling, left        Treatment Plan:   ED Disposition     Discharge [facility] discharge to home/self care.    Condition at disposition: Stable              _______________________________      Attestations: This note is prepared by Rowe Clack, acting as scribe for Caroline Sauger, MD.    Courtney Paris, MD - The scribe's documentation has been prepared under my direction and personally reviewed by me in its entirety.  I confirm that the note above accurately reflects all work, treatment, procedures, and medical decision making performed by me.    _______________________________    Ashley Jacobs, MD  01/16/16 1053

## 2016-01-16 NOTE — ED Notes (Signed)
Family at bedside.spouse

## 2016-01-17 LAB — BASIC METABOLIC PANEL
Anion Gap: 10 (ref 5.0–15.0)
BUN: 13 mg/dL (ref 7.0–19.0)
CO2: 25 mEq/L (ref 22–29)
Calcium: 9.3 mg/dL (ref 7.9–10.2)
Chloride: 105 mEq/L (ref 100–111)
Creatinine: 0.6 mg/dL (ref 0.6–1.0)
Glucose: 85 mg/dL (ref 70–100)
Potassium: 4.1 mEq/L (ref 3.5–5.1)
Sodium: 140 mEq/L (ref 136–145)

## 2016-01-17 LAB — CBC AND DIFFERENTIAL
Absolute NRBC: 0 10*3/uL
Basophils Absolute Automated: 0.02 10*3/uL (ref 0.00–0.20)
Basophils Automated: 0.3 %
Eosinophils Absolute Automated: 0.13 10*3/uL (ref 0.00–0.70)
Eosinophils Automated: 1.8 %
Hematocrit: 36.8 % — ABNORMAL LOW (ref 37.0–47.0)
Hgb: 12.1 g/dL (ref 12.0–16.0)
Immature Granulocytes Absolute: 0.02 10*3/uL
Immature Granulocytes: 0.3 %
Lymphocytes Absolute Automated: 2.6 10*3/uL (ref 0.50–4.40)
Lymphocytes Automated: 35.6 %
MCH: 28.5 pg (ref 28.0–32.0)
MCHC: 32.9 g/dL (ref 32.0–36.0)
MCV: 86.8 fL (ref 80.0–100.0)
MPV: 9.6 fL (ref 9.4–12.3)
Monocytes Absolute Automated: 0.89 10*3/uL (ref 0.00–1.20)
Monocytes: 12.2 %
Neutrophils Absolute: 3.64 10*3/uL (ref 1.80–8.10)
Neutrophils: 49.8 %
Nucleated RBC: 0 /100 WBC (ref 0.0–1.0)
Platelets: 344 10*3/uL (ref 140–400)
RBC: 4.24 10*6/uL (ref 4.20–5.40)
RDW: 14 % (ref 12–15)
WBC: 7.3 10*3/uL (ref 3.50–10.80)

## 2016-01-17 LAB — ECG 12-LEAD
Atrial Rate: 43 {beats}/min
Q-T Interval: 352 ms
QRS Duration: 78 ms
QTC Calculation (Bezet): 398 ms
R Axis: 41 degrees
T Axis: 94 degrees
Ventricular Rate: 77 {beats}/min

## 2016-01-17 LAB — TSH
TSH: 5.92 u[IU]/mL — ABNORMAL HIGH (ref 0.35–4.94)
TSH: 6.41 u[IU]/mL — ABNORMAL HIGH (ref 0.35–4.94)

## 2016-01-17 LAB — TROPONIN I
Troponin I: <0.01 ng/mL (ref 0.00–0.09)
Troponin I: <0.01 ng/mL (ref 0.00–0.09)
Troponin I: <0.01 ng/mL (ref 0.00–0.09)

## 2016-01-17 LAB — T4: T4: 5.8 ug/dL (ref 4.9–11.7)

## 2016-01-17 LAB — GFR: EGFR: >60

## 2016-01-17 LAB — DIGOXIN LEVEL: Digoxin Level: 0.6 ng/mL (ref 0.5–2.0)

## 2016-01-17 LAB — HEMOLYSIS INDEX: Hemolysis Index: 1 (ref 0–18)

## 2016-01-17 MED ORDER — DIGOXIN 125 MCG PO TABS
0.1250 mg | ORAL_TABLET | Freq: Every day | ORAL | Status: DC
Start: 2016-01-17 — End: 2020-07-08

## 2016-01-17 MED ORDER — METOPROLOL SUCCINATE ER 100 MG PO TB24
100.0000 mg | ORAL_TABLET | Freq: Every day | ORAL | Status: AC
Start: 2016-01-17 — End: ?

## 2016-01-17 NOTE — UM Notes (Signed)
MEDICARE PART A AND B  Place (admit) for Observation Services (Order [id number]) 01/16/16 2042    Cynthia Kim is a 82 y.o. female with Hx of arthritis and A fib presenting to the ED with a sudden unwitnessed LOC 2.5 hours ago. She states this morning she was prescribed a new pain med and inflammation med for her wrist pain (unable to recall name). 2 hours after taking it, she became extremely nauseated. She was vomiting and leaning forward when she passed out. Denies feeling lightheaded prior and is unable to recall falling. Pt's husband found her getting up after her syncope. No Hx of MI. Only takes ASA for A fib. Denies other blood thinner.     Past Medical History   Diagnosis Date   . Atrial fibrillation    . Arthritis    . A-fib      BP 143/67 mmHg  Pulse 70  Temp(Src) 97.1 F (36.2 C) (Oral)  Resp 20  Ht 5\' 4"  (1.626 m)  Wt 56.7 kg  BMI 21.45 kg/m2  SpO2 97%    Temp:  [96 F (35.6 C)-97.9 F (36.6 C)]   Heart Rate:  [57-79]   Resp Rate:  [16-20]   BP: (136-168)/(59-92)   SpO2:  [91 %-100 %]   Height:  [162.6 cm (5\' 4" )]   Weight:  [55.792 kg (123 lb)-56.7 kg (125 lb)]   BMI (calculated):  [21.5]     Last recorded pain score:  Pain Scale Used: Numeric Scale (0-10)  Pain Score: 0-No pain     Lab Results last 48 Hours     Procedure Component Value Units Date/Time    CBC with differential [[id number]]  (Abnormal) Collected:  01/17/16 0336     Hematocrit 36.8 (L) %     Comprehensive Metabolic Panel (CMP) [[id number]]  (Abnormal) Collected:  01/16/16 1640     Glucose 110 (H) mg/dL      Chloride 99 (L) mEq/L     Hemolysis index [[id number]]  (Abnormal) Collected:  01/16/16 1640     Hemolysis Index 36 (H) Updated:  01/16/16 1716     CT Head without Contrast Impression:   Multiple areas of subcutaneous hematoma overlying the right  frontal bone, and posterior skull. Underlying bones appear intact. There  is no acute intracranial hemorrhage or acute intracranial abnormality  appreciated.    ED  meds:  01/16/2016 1655 sodium chloride 0.9 % bolus 500 mL 500 mL Intravenous    Scheduled Meds:  Current Facility-Administered Medications   Medication Dose Route Frequency   . aspirin EC  325 mg Oral Daily   . digoxin  0.125 mg Oral Daily   . metoprolol  50 mg Oral BID   . naproxen  250 mg Oral BID Meals   . sertraline  25 mg Oral Daily     Continuous Infusions:  . sodium chloride 75 mL/hr at 01/16/16 2225     Orders: VS and neuro checks q4hrs, Tele, PT/OT eval and treat    Assessment:  1.   Syncope, likely vasovagal  2.   Vertigo, benign positional  3.   Dehydration  4.   Arthritis of the hands  5.   Atrial fibrillation  6.   Hypertension    Plan:  1.   Hydrate overnight  2.   Orthostatic vitals  3.   Serial troponin  4.   Avoid narcotics  5.   Continue meclizine and Epley maneuver for vertigo  6.   PT, OT evaluation  7.   Cardiologist consulted for V heart        Larina Earthly, BSN, RN  Utilization Review Nurse  Case Management Dept  [facility]  [phone] [facility] Staff Only)  [phone] (Carriers)    Please submit all clinical review request via fax to [phone]

## 2016-01-17 NOTE — Progress Notes (Signed)
Patient discharged to home after clearance from cardiology and Dr Thelma Barge. Discharge instructions, medications, and follow up information reviewed with patient and family members at bedside by RN Shionee. All questions/concerns were addressed. Medication metoprolol questions clarified with family members. IV and tele removed prior to discharge. Patient ambulated off the unit with RN and family members.

## 2016-01-17 NOTE — Plan of Care (Signed)
Problem: Occupational Therapy  Goal: By discharge, patient will perform self-care at the patient's highest functional potential.  See OT evaluation for goals.  Outcome: Adequate for Discharge  Patient presents at baseline and is Independent with functional tasks.   No further skilled OT needed.

## 2016-01-17 NOTE — OT Eval Note (Addendum)
[facility]   Occupational Therapy Evaluation and Treatment    Patient: Cynthia Kim     MRN#: [id number]   Unit: [facility] 4 NORTH  Bed: A4021/A4021.01    Time of Evaluation and Treatment: Time Calculation  OT Received On: 01/17/16  Start Time: 1527  Stop Time: 1603  Time Calculation (min): 36 min    Chart Review and Collaboration with Care Team: 4 minutes  Evaluation: 23 minutes  Treatment:  13 minutes    OT Visit Number: 1    Consult received for Cynthia Kim for OT Evaluation and Treatment.  Patient's medical condition is appropriate for Occupational therapy intervention at this time.    Activity Orders: none noted  Precautions and Contraindications: none  Precautions  Weight Bearing Status: no restrictions    Medical Diagnosis: Syncope and collapse [R55]  Scalp contusion [S00.03XA]  Closed head injury, initial encounter [S09.90XA]    History of Present Illness: Cynthia Kim is a 82 y.o. female admitted on 01/16/2016 with syncopal episode after vomiting- hitting head sustaining hematoma/head injury.  Pt also has a hx of vertigo.  Pt also with new onset of left wrist swelling at joint and redness with decreased ROM.  Pt is set up to see ortho MD - Dr. Maisie Fus.    Patient Active Problem List   Diagnosis   . Syncope and collapse        Past Medical/Surgical History:  Past Medical History   Diagnosis Date   . Atrial fibrillation    . Arthritis    . A-fib       Past Surgical History   Procedure Laterality Date   . Hysterectomy           X-Rays/Tests/Labs:  Lab Results   Component Value Date/Time    HGB 12.1 01/17/2016 03:36 AM    HEMATOCRIT 36.8* 01/17/2016 03:36 AM    POTASSIUM 4.1 01/17/2016 03:36 AM    SODIUM 140 01/17/2016 03:36 AM    PT INR 1.0 01/16/2016 04:40 PM    TROPONIN I <0.01 01/17/2016 09:50 AM    TROPONIN I <0.01 01/17/2016 03:35 AM    TROPONIN I 0.01 01/16/2016 07:47 PM    TROPONIN I <0.01 01/16/2016 04:40 PM     Family at bedside      Social History:  Prior Level of  Function  Prior level of function: Independent with ADLs, Ambulates independently  Baseline Activity Level: Community ambulation  Driving: independent  Dressing - Upper Body: independent  Dressing - Lower Body: independent  Cooking: Yes  Feeding: independent  Bathing: independent  Grooming: independent  Toileting: independent  Employment: Retired  DME Currently at [employer]:  (none)    Home Living Arrangements  Living Arrangements: Spouse/significant other  Type of Home: House  Home Layout: Multi-level  Bathroom Shower/Tub: Walk-in Physiological scientist: Standard  DME Currently at Home:  (none)  Home Living - Notes / Comments:  (Husband works during the day)    Subjective:     Patient is agreeable to participation in the therapy session. Family and/or guardian are agreeable to patient's participation in the therapy session. Nursing clears patient for therapy.      Pain Assessment  Pain Assessment:  (pt has left wrist pain and swelling which is unrelated )- wrist situation is unrelated to syncopal event      Objective:   Inspection/Posture  Inspection/Posture:  (Pt at EOB)  Observation of Patient/Vital Signs:Stable  Patient received seated at edge of bed with  telemetry  and intravenous (IV) line in place.      Cognitive Status and Neuro Exam:  Cognition/Neuro Status  Arousal/Alertness: Appropriate responses to stimuli  Attention Span: Appears intact  Orientation Level: Oriented X4  Memory: Appears intact  Following Commands: independent  Safety Awareness: independent  Insights: Fully aware of deficits  Problem Solving: Able to problem solve independently  Behavior: calm, cooperative  Motor Planning: intact  Coordination: intact  Hand Dominance: left handed      Musculoskeletal Examination  Gross ROM  Right Upper Extremity ROM: within functional limits  Left Upper Extremity ROM: within functional limits (except left wrist limitations due to swelling)  Gross Strength  Right Upper Extremity Strength: within functional  limits  Left Upper Extremity Strength: within functional limits (shoulder-elbow     decreased grip strength left)       Sensory/Oculomotor Examination  Sensory  Auditory:  (decreased hearing)  Tactile - Light Touch: intact  Visual Acuity: intact         Activities of Daily Living  Self-care and Home Management  Eating: Independent  Grooming: Independent  Bathing: Supervision  UB Dressing: Independent  LB Dressing: Stand by Assist  Toileting: Supervision  Functional Transfers: Supervision    Functional Mobility:  Mobility and Transfers  Supine to Sit: Supervision  Sit to Supine: Supervision  Sit to Stand: Armed forces logistics/support/administrative officer Mobility/Ambulation: Contact Guard Assist- pt then progressed to independently ambulating in hallway-no device.     Balance  Balance  Static Sitting Balance: good  Dyanamic Sitting Balance: good  Static Standing Balance: good    Participation and Activity Tolerance  Participation and Endurance  Participation Effort: good      Educated the patient to role of occupational therapy, plan of care, goals of therapy and safety with mobility and ADLs, home safety.    Patient left at edge of bed with call bell and all personal items/needs within reach. RN notified of session outcome.     Assessment: Cynthia Kim is a 82 y.o. female admitted 01/16/2016.   Brief chart review completed including review of labs and review of imaging.  Pt's ability to complete ADLs and functional transfers is impaired due to the following deficits:  Left wrist pain and swelling.  Pt demonstrates performance deficits with none- pt is functioning at baseline. There are a few comorbidities or other factors that affect plan of care and require modification of task including: vertigo/dizziness, has stairs to manage and home alone for a portion of the day.  Pt would continue to benefit from OT to address these deficits and increase functional independence.    Assessment: Appears to be at baseline for ADL's;Appears to be  at baseline for mobility     Complexity Chart Review Performance Deficits Clinical Decision Making Hx/Comorbidities Assistance needed   Low Brief 1-3 Limited options None None (or at baseline)     Treatment:  Pt completed ambulation X 200 ft with no device and no loss of balance.  Toilet transfers and standing at sink.  Pt dressed UE and LE's in prep for d/c.    Rehabilitation Potential: Prognosis: Good    Plan:     Treatment Interventions: ADL retraining, Functional transfer training     Risks/benefits/POC discussed    G codes: yes  Self Care, Current Status (Z6109): CI   Self Care, Goal Status (U0454): CI   Self Care, D/C Status (U9811): CI    AM-PAC:yes  OT Daily Activity Raw Score: 23  CMS  0-100% Score: 15.86%             Goals: Time For Goal Achievement: 1 visit  ADL Goals  Patient will groom self: Independent, at sinkside, Goal met  Patient will dress lower body: Independent, Goal met  Patient will toilet: Independent, Goal met  Other Goal: Pt will complete bed mobility independently (goal met)  Mobility and Transfer Goals  Pt will perform functional transfers: Independent, Goal met                               DME Recommended for Discharge:  (none)  Discharge Recommendation: Home with no needs    Vernona Rieger  OTR/L   01/17/2016 4:13 PM

## 2016-01-17 NOTE — Plan of Care (Signed)
Problem: Neurological Deficit  Goal: Neurological status is stable or improving  Outcome: Progressing    01/17/16 0732   Goal/Interventions addressed this shift   Neurological status is stable or improving Monitor/assess/document neurological assessment (Stroke: every 4 hours);Re-assess NIH Stroke Scale for any change in status;Observe for seizure activity and initiate seizure precautions if indicated         Comments:   Alert ox4,no dizzines,no further neuro deficit.

## 2016-01-17 NOTE — Discharge Instructions (Addendum)
Behavior Changes After Brain Injury  After a brain injury, a person may behave in new or different ways and may have personality changes. Patients may become agitated or aggressive, and these mood changes may be disturbing. Some may curse, laugh, or cry out of context. Others may show increased or decreased sexual interest. Judgment may be altered. This can have financial and legal implications.  Behavior changes may be caused by damage to the brain. Or they may result from the person's increasing awareness of what has happened. Such changes may be linked to frustration, anger, or grief.  Handling feelings  Many patients have extreme mood swings. Others show no change in emotions. As a patient becomes more aware, depression may set in. Signs of depression should be brought to the attention of health care professionals. A number of treatments are available that may be helpful for improving the patient's quality of life.  Team members address the patient's feelings and behavior. A team member may ask an angry patient to "calm down." If the person does so, he or she is praised for using self-control. Then the patient may be asked how he or she was able to handle the emotion. If the patient knows, the technique can be used again.  Controlling agitation  Agitation and aggression may be stages a patient passes through. One needs to ensure that there is no physical, medical, or psychiatric cause for the agitation. If the patient's safety is a concern, restraints may be used. Also be sure to contact the health care team. Or team members may take turns staying with the patient. As a patient becomes calmer, the team may do the following:   Point out when a behavior is not proper. Then explain what the patient could do instead.   Redirect agitated actions such as pacing.   Divert the patient from tasks that are upsetting.  Regaining social skills  After a brain injury, some patients see only how matters relate to  themselves. They may not be aware of how their actions and words affect others. Group rehab helps patients learn to deal with others. It also improves speech. Playing games helps patients link ideas and increase hand-eye skills.  You can help  Try to act in ways that teach good behavior. Also, let the person know he or she is still needed and loved. Try the tips below.   Stay calm.   Do not hold a grudge.   Do not always give in to demands.   See depression as a stage of recovery.   Ignore outbursts of anger. Direct the person toward a task he or she can do.   Do not cringe, frown, roll your eyes, shake your head, or clear your throat.   Make contact. Hug, hold hands, offer a gentle touch.  Date Last Reviewed: 04/28/2014   2000-2016 [employer] [address], [place], [place] [address]. All rights reserved. This information is not intended as a substitute for professional medical care. Always follow your healthcare professional's instructions.          Behavior Changes After Brain Injury  After a brain injury, a person may behave in new or different ways and may have personality changes. Patients may become agitated or aggressive, and these mood changes may be disturbing. Some may curse, laugh, or cry out of context. Others may show increased or decreased sexual interest. Judgment may be altered. This can have financial and legal implications.  Behavior changes may be caused  by damage to the brain. Or they may result from the person's increasing awareness of what has happened. Such changes may be linked to frustration, anger, or grief.  Handling feelings  Many patients have extreme mood swings. Others show no change in emotions. As a patient becomes more aware, depression may set in. Signs of depression should be brought to the attention of health care professionals. A number of treatments are available that may be helpful for improving the patient's quality of life.  Team members address the  patient's feelings and behavior. A team member may ask an angry patient to "calm down." If the person does so, he or she is praised for using self-control. Then the patient may be asked how he or she was able to handle the emotion. If the patient knows, the technique can be used again.  Controlling agitation  Agitation and aggression may be stages a patient passes through. One needs to ensure that there is no physical, medical, or psychiatric cause for the agitation. If the patient's safety is a concern, restraints may be used. Also be sure to contact the health care team. Or team members may take turns staying with the patient. As a patient becomes calmer, the team may do the following:   Point out when a behavior is not proper. Then explain what the patient could do instead.   Redirect agitated actions such as pacing.   Divert the patient from tasks that are upsetting.  Regaining social skills  After a brain injury, some patients see only how matters relate to themselves. They may not be aware of how their actions and words affect others. Group rehab helps patients learn to deal with others. It also improves speech. Playing games helps patients link ideas and increase hand-eye skills.  You can help  Try to act in ways that teach good behavior. Also, let the person know he or she is still needed and loved. Try the tips below.   Stay calm.   Do not hold a grudge.   Do not always give in to demands.   See depression as a stage of recovery.   Ignore outbursts of anger. Direct the person toward a task he or she can do.   Do not cringe, frown, roll your eyes, shake your head, or clear your throat.   Make contact. Hug, hold hands, offer a gentle touch.  Date Last Reviewed: 04/28/2014   2000-2016 [employer] [address], [place], [place] [address]. All rights reserved. This information is not intended as a substitute for professional medical care. Always follow your healthcare professional's  instructions.

## 2016-01-17 NOTE — Progress Notes (Signed)
2 hematoma on her scalp,secondary to fall

## 2016-01-17 NOTE — Consults (Signed)
NEUROLOGY CONSULTATION    Date Time: 01/17/2016 2:37 PM  Patient Name: Cynthia Kim,Cynthia Kim  Attending Physician: Sharlette Dense, MD      Assessment & Plan:   82 yr old female with suspected vasovagal syncope. Unlikely seizure given no post ictal state, tongue lacerations or any bowel/bladder loss     Patients story is most consistent with a vasovagal event.   Reviewed head CT and no acute intracranial pathology   Exam is non-focal   Discussed in detail her afib and medications. Cardiology note has summarized her history well and she plans to discuss [facility] options as an outpatient but at this time wishes to stay on ASA   No addition recs at this time    History of Present Illness:    This is a pleasant 82 yr old female with a past medical history of atrial fibrillation and HTN who was admitted after a syncopal event.  She endorses recently being prescribed a d pain medication for left wrist pain and after taking teh first dose suddenly felt nauseous and lightheaded.  She also mentions being dehydrated over the past few days.  She then went to the restroom feeling nauseous and then passed out and woke up with her husband standing over her on the bathroom floor.  She denies any loss of bowel or bladder control, no tongue biting and no post ictal state knowing exactly where she was.  She decided to come in for evaluation after noticing some bumps on her head. Currently, she is back to baseline and feeling better    Past Medical History:     Past Medical History   Diagnosis Date   . Atrial fibrillation    . Arthritis    . A-fib        Meds:      Scheduled Meds: PRN Meds:      aspirin EC 325 mg Oral Daily   digoxin 0.125 mg Oral Daily   metoprolol 50 mg Oral BID   naproxen 250 mg Oral BID Meals   sertraline 25 mg Oral Daily       Continuous Infusions:  . sodium chloride 75 mL/hr at 01/17/16 1145      acetaminophen 650 mg 4X Daily PRN   meclizine 12.5 mg TID PRN   naloxone 0.2 mg PRN         I personally reviewed all of the  medications    Allergies   Allergen Reactions   . Norco [Hydrocodone-Acetaminophen] Other (See Comments)     Pt reports dizziness within 2 hours of taking; taken with small meal.    . Other Other (See Comments)     Mosquito bite causes her to have blisters.       Social & Family History:     Social History     Social History   . Marital Status: Married     Spouse Name: N/A   . Number of Children: N/A   . Years of Education: N/A     Occupational History   . Not on file.     Social History Main Topics   . Smoking status: Never Smoker    . Smokeless tobacco: Not on file   . Alcohol Use: No   . Drug Use: No   . Sexual Activity: Not on file     Other Topics Concern   . Not on file     Social History Narrative    ** Merged History Encounter **  No family history on file.    Review of Systems:   No headache, eye, ear nose, throat problems; no coughing or wheezing or shortness of breath, No chest pain or orthopnea, no abdominal pain, nausea or vomiting, No pain in the body or extremities, no psychiatric, neurological, endocrine, hematological or cardiac complaints except as noted above.       Physical Exam:   Blood pressure 151/83, pulse 60, temperature 96.6 F (35.9 C), temperature source Oral, resp. rate 16, height 1.626 m (5\' 4" ), weight 55.792 kg (123 lb), SpO2 96 %.    HEENT: Normocephalic. Non-icter, no congestion, no carotid bruits  Optho:  unable to visualize  Lungs:  CTA bil  Cardiac:  S1,S2, normal rate and rhythm  Neck: supple, no lymphadenopathy no JVD, no cartoid bruits  Extremities: no clubbing, cyanosis, or edema  Skin: no rashes or lesions noted    Neuro:  Level of consciousness:  Alert and appropriate. No aphasia or dysarthria  Oriented:  X 3  Cognition:  Intact naming, recognition, concentration and following complex commands  Cranial Nerves:  II-XII intact  Strength:  No upper extremity drift, 5/5 strength x 4 extremities  Coordination:  Intact FTN testing  Reflexes:  +2 throughout, down going  toes bil  Sensation: Intact x 4 extremities to LT, temp, vibration  Gait:  Deferred       Labs:     Recent Labs  Lab 01/17/16  0336 01/16/16  1640 01/16/16  0649   GLUCOSE 85 110* 119*   BUN 13.0 18.0 16.0   CREATININE 0.6 0.9 0.8   CALCIUM 9.3 9.8 9.5   SODIUM 140 138 139   POTASSIUM 4.1 4.7 3.8   CHLORIDE 105 99* 103   CO2 25 27 24    ALBUMIN  --  3.8 3.8   AST (SGOT)  --  22 15   ALT  --  15 14   BILIRUBIN, TOTAL  --  1.0 1.1   ALKALINE PHOSPHATASE  --  94 93       Recent Labs  Lab 01/17/16  0336 01/16/16  1640 01/16/16  0649   WBC 7.30 10.62 7.67   HGB 12.1 13.0 13.5   HEMATOCRIT 36.8* 39.2 39.3   MCV 86.8 86.3 86.2   MCH 28.5 28.6 29.6   MCHC 32.9 33.2 34.4   PLATELETS 344 372 358         Recent Labs      01/16/16   1640   PT  13.4   PT INR  1.0          Radiology Results (24 Hour)     Procedure Component Value Units Date/Time    CT Head without Contrast [[id number]] Collected:  01/16/16 1734    Order Status:  Completed Updated:  01/16/16 1740    Narrative:      CLINICAL INDICATION:  Fall, injury, patient is on Coumadin.    TECHNIQUE:  Axial noncontrast CT images were obtained from the skull  base to the vertex. A combination of a automatic exposure control,  adjustment of the mA and/or kV  according to patient size and/or use of  iterative reconstruction technique was utilized.      COMPARISON:  None available    INTERPRETATION: There are several small areas of subcutaneous hematoma  overlying the right frontal bone and posterior skull vertex as well as  posterior lateral left skull. Underlying bones appear grossly intact.  There is no acute intracranial hemorrhage or extra-axial  fluid  collection. The ventricles have a normal configuration. There is no  midline shift or mass effect.      Impression:       Multiple areas of subcutaneous hematoma overlying the right  frontal bone, and posterior skull. Underlying bones appear intact. There  is no acute intracranial hemorrhage or acute intracranial  abnormality  appreciated.    Wyatt Portela, MD   01/16/2016 5:36 PM      Chest AP Portable [[id number]] Collected:  01/16/16 1723    Order Status:  Completed Updated:  01/16/16 1730    Narrative:      CLINICAL INDICATION: Weakness     COMPARISON: 07/14/2005    INTERPRETATION:   A single frontal view of the chest was obtained. .    There is stable cardiomegaly. Pulmonary vasculature is normal. Lungs are  clear. There is no focal infiltrate or pleural effusion.      Impression:        No acute cardiopulmonary process.    Wyatt Portela, MD   01/16/2016 5:26 PM             All recent brain and spine imaging (MRI, CT) personally reviewed.    Chart reviewed    Case discussed with patient and family at bedside as well as nursing staff    Signed by: Jaquita Rector, MD  Spectralink: [phone]       Answering Service: [phone]

## 2016-01-17 NOTE — Discharge Summary (Addendum)
DISCHARGE SUMMARY    Date Time: 01/17/2016 3:44 PM  Patient Name: [name],[name]  Attending Physician: Sharlette Dense, MD    Date of Admission:   01/16/2016    Date of Discharge:   01/17/2016    Reason for Admission:   Syncope and collapse [R55]  Scalp contusion [S00.03XA]  Closed head injury, initial encounter [S09.90XA]    Discharge Dx:   Present on Admission:   . Syncope and collapse   Syncope, likely vasovagal- likely vasovagal - resolved , no orthostasis , Seen by Neuro , cardiology    Vertigo, benign positional - improved    Dehydration-improved   Arthritis of the hands   Atrial fibrillation-stable   Hypertension  Consultations:   Treatment Team:   Attending Provider: Sharlette Dense, MD  Consulting Physician: Eric Form, MD      Procedures performed:       Discharge Medications:        Discharge Medication List      Taking          aspirin EC 325 MG EC tablet   Dose:  325 mg   Take 325 mg by mouth daily.       digoxin 0.125 MG tablet   Dose:  0.125 mg   What changed:    - medication strength  - how much to take  - when to take this  - Another medication with the same name was removed. Continue taking this medication, and follow the directions you see here.   Commonly known as:  LANOXIN   Take 1 tablet (0.125 mg total) by mouth daily.       DYMISTA NA   by Nasal route.       HYDROcodone-acetaminophen 5-325 MG per tablet   Dose:  1 tablet   Commonly known as:  NORCO   Take 1 tablet by mouth every 6 (six) hours as needed.       meclizine 12.5 MG tablet   Dose:  25 mg   Commonly known as:  ANTIVERT   Take 25 mg by mouth 3 (three) times daily as needed.       metoprolol 100 MG 24 hr tablet   Dose:  1 tablet   What changed:  Another medication with the same name was removed. Continue taking this medication, and follow the directions you see here.   Commonly known as:  TOPROL-XL   Take 1 tablet by mouth daily.Take with Toprol XL 50 mg tablets to equal Toprol XL 150 mg daily       naproxen 250 MG tablet    Dose:  250 mg   Commonly known as:  NAPROSYN   Take 1 tablet (250 mg total) by mouth 2 (two) times daily with meals.       sertraline 25 MG tablet   Dose:  25 mg   Commonly known as:  ZOLOFT   Take 25 mg by mouth daily.       traMADol 50 MG tablet   Dose:  50 mg   Commonly known as:  ULTRAM   Take 50 mg by mouth every 6 (six) hours as needed for Pain.       vitamin D 1000 UNIT tablet   Dose:  1000 Units   Take 1,000 Units by mouth daily.               Hospital Course:   Cynthia Kim is a 82 y.o. female with history of atrial fibrillation, benign positional vertigo,  arthritis who presents to the hospital with syncopal episode that happened in the bathroom after vomiting, patient remembers getting up from the floor and was able to get back to her bed.  She denied prodromal cardiac or neurological symptoms.  Details of admission per H and P and consultations on record.  Patient was admitted to hospital , labs and electrolytes were stable.  CT head negative for acute process, chest x-ray and left wrist  X-rays negative for acute process.  Patient was seen by neurology and cardiology in consultations  , no further neuro workup recommended. Events was likely vasovagal   Patient ruled out for MI   Metoprolol dose decreased 100 mg, digoxin 0.125 mg.  She has atrial fibrillation.  Coumadin was discussed, per cardiology notes on multiple occasions  , patient and family deferred it and wanted to continue aspirin.  Patient needs to follow up with cardiology as outpatient.  She had extensive workup in  cardiology office - As on record and will continue follow-up with cardiology after discharge.  She looks and feels better .  Patient seen by  PT Ok to go  home .  Advised to drink platy of fluids diuretics held     She will continue Antivert for Vertigo .  metoprolol decreased to 100 mg ER and dig to 0.125 mg   Hold HCTZ until seen By PCP or  Cardiology   Plan discussed with Patient , Husband and daughters .  D/w Nurse    Laboratory Data     CBC    Recent Labs  Lab 01/17/16  0336 01/16/16  1640 01/16/16  0649   WBC 7.30 10.62 7.67   HGB 12.1 13.0 13.5   HEMATOCRIT 36.8* 39.2 39.3   PLATELETS 344 372 358   MCV 86.8 86.3 86.2   NEUTROPHILS 49.8 62.8 45.7       CMP    Recent Labs  Lab 01/17/16  0336 01/16/16  1640 01/16/16  0649   SODIUM 140 138 139   POTASSIUM 4.1 4.7 3.8   CHLORIDE 105 99* 103   CO2 25 27 24    BUN 13.0 18.0 16.0   CREATININE 0.6 0.9 0.8   GLUCOSE 85 110* 119*   CALCIUM 9.3 9.8 9.5   PROTEIN, TOTAL  --  7.4 7.3   ALBUMIN  --  3.8 3.8   AST (SGOT)  --  22 15   ALT  --  15 14   ALKALINE PHOSPHATASE  --  94 93   BILIRUBIN, TOTAL  --  1.0 1.1       Lipid panel              Lab Results   Component Value Date    TSH 5.230* 03/20/2007       Cardiac enzymes    Recent Labs  Lab 01/17/16  0950 01/17/16  0335 01/16/16  1947   TROPONIN I <0.01 <0.01 0.01         Recent Labs  Lab 01/16/16  1640   PT 13.4   PT INR 1.0       Culture:         Microbiology Results     None          All radiology result for current encounter:  Wrist Left Pa Lateral And Oblique    01/16/2016   No fracture or acute process . Degenerative changes which have progressed from the prior study. Heron Nay, MD 01/16/2016 7:44 AM  Ct Head Without Contrast    01/16/2016   Multiple areas of subcutaneous hematoma overlying the right frontal bone, and posterior skull. Underlying bones appear intact. There is no acute intracranial hemorrhage or acute intracranial abnormality appreciated. Wyatt Portela, MD 01/16/2016 5:36 PM     Chest Ap Portable    01/16/2016    No acute cardiopulmonary process. Wyatt Portela, MD 01/16/2016 5:26 PM       Echo Results     None            Physical Exam:   BP 165/64 mmHg  Pulse 60  Temp(Src) 96.6 F (35.9 C) (Oral)  Resp 16  Ht 1.626 m (5\' 4" )  Wt 55.792 kg (123 lb)  BMI 21.10 kg/m2  SpO2 96%    General appearance - alert, and in no distress  HEENT: Normocephalic,atraumatic, pupil equal  Neck - supple  Chest - clear to  auscultation  Heart - irregularly irregular.    Abdomen - bowel sounds normal, soft, non distended  Extremities - no pedal edema  Neurological - Alert , No gross focal deficits     Discharge condition:   Stable     Discharge  Diet :   Cardiac     Discharge Instructions:   Follow up:     Hayden Pedro, MD  [address]  [place] [address]  [phone]    In 1 week      Kurtis Bushman, MD  [address]  [place] [address]  [phone]    In 1 week      Jaquita Rector, MD  [address]  [place] [address]  [phone]    In 1 week  for follow up         Sharlette Dense, MD  01/17/2016  3:44 PM  Time spent 35  minutes

## 2016-01-17 NOTE — Plan of Care (Signed)
Problem: Neurological Deficit  Goal: Neurological status is stable or improving  Outcome: Progressing    01/17/16 0256   Goal/Interventions addressed this shift   Neurological status is stable or improving Monitor/assess/document neurological assessment (Stroke: every 4 hours);Monitor/assess NIH Stroke Scale         Comments:   NIH done, negative. Neuro check done. Orthostatic BP's started. Explained to patient to take time when changing from lying to sitting to standing. IVF of NS @ 75 ml/hr started. Explained the need for IVF and the need to check orthostatic every shift. Patient and family verbalized understanding.

## 2016-01-17 NOTE — Progress Notes (Signed)
An 82 y/o white female admitted from ED. Patient and family (spouse and daughter) oriented in the room and unit. Assessment done and noted as well as history. NIH scale done, neuro check done as well. Orthostatic started as patient had a syncopal episode. Plan of care discussed with patient, fall risk discussed with patient and family. All verbalized understanding.

## 2016-01-17 NOTE — Progress Notes (Signed)
Pt. Admitted from home w/dx: syncope and collapse; scalp contusion.    Pt. A/O and updated all information w/ daughter at bedside.    DCP home w/family to transport.  Patient Cardiologist: Dr. Marlise Eves                                    [phone]  Pt. Very active and will requests to schedule her own follow up appointment.     01/17/16 1109   Patient Type   Within 30 Days of Previous Admission? No   Healthcare Decisions   Interviewed: Patient;Family   Orientation/Decision Making Abilities of Patient Alert and Oriented x3, able to make decisions   Advance Directive Patient does not have advance directive  (Patient states she is not interested)   Healthcare Agent Appointed No   Prior to admission   Prior level of function Independent with ADLs   Type of Residence Private residence   Home Layout Multi-level;Stairs to enter without rails (add number in comment)  (1)   Have running water, electricity, heat, etc? Yes   Living Arrangements Spouse/significant other   How do you get to your MD appointments? drive self   How do you get your groceries? self   Who fixes your meals? self   Who does your laundry? self   Who picks up your prescriptions? self   Dressing Independent   Grooming Independent   Feeding Independent   Bathing Independent   Toileting Independent   Adult Protective Services (APS) involved? No   Discharge Planning   Support Systems Family members;Spouse/significant other   Patient expects to be discharged to: home   Anticipated [place] plan discussed with: Patient   Follow up appointment scheduled? No   Reason no follow up scheduled? Family to schedule   Mode of transportation: Private car (family member)   Consults/Providers   PT Evaluation Needed 1   OT Evalulation Needed 1   SLP Evaluation Needed 2   Correct PCP listed in Epic? Yes   Important Message from [facility] Notice   Patient received 1st IMM Letter? No   Robb Matar, BSW  Case Management  Ext. [phone]

## 2016-01-17 NOTE — Consults (Signed)
[place] [facility] CARD[facility] CONSULTATION REPORT  [facility]    Date Time: 01/17/2016 12:11 PMPatient Name: Cynthia Kim,Cynthia Kim  Requesting Physician: Sharlette Dense, MD             History:   Cynthia Kim is a 82 y.o. female admitted on 01/16/2016.  We have been asked by Sharlette Dense, MD,  to provide cardiac consultation, regarding syncope and atrial fibrillation.    She was see by her ENT doctor on 01/15/16 for vertigo.  She had recently been prescribed meclizine and Epley maneuver.       She noted left wrist swelling two days ago and went to the ER yesterday morning.  She was also prescribed narco and naproxen for left wrist pain and discharged home.    Around 2:00 pm, around two hours after taking the pain medication, she noted the onset of nausea.  She walked into the bathroom and had an episode of vomiting.  She apparently had a brief syncopal episode and woke up on the floor.  She was found by her husband as she was trying to get up.  She was brought to the ER 2 1/2 hours later and admitted.  A cardiology consultation is requested.     Her cardiac history dates to 2006 when she was seen by her primary care physician for evaluation of intermittent palpitations that had been ongoing for one year; occurring once a month.  She subsequently had persistent palpitations and was found to be in atrial fibrillation and started on toprol and digoxin by her primary care physician.  She was seen in our office on 12/16/04 for evaluation.  She ws in normal sinus rhythm at that time.  Digoxin dose was decreased to 0.125 mg po daily at that time.    A stress nuclear study was done on 12/24/04.  She exercied 6:00 minutes; 7 METs.  There were noted to be PVC and 2 mm ST segment depression although the nuclear perfusion images noted normal myocardial perfusion. She was seen in our office on 01/28/05 and continued on medical therapy.    She was next seen in our office on 03/17/07 after being found to be in atrial  fibrillation by Dr. Herbie Baltimore.  Her toprol ws increased to 125 mg po daily.  Coumadin was discussed but the patient declined.  She was seen again in the office on 05/17/07 and noted to be in atrial fibrillation with ventricular rate 90 bpm.  She again deferred coumadin therapy.    An echocardiogram was done on 06/05/07 noting normal LV size, wall thickness and systolic function; mild to moderate MR with LAE; minimal aortic valve sclerosis; moderate TR with estimated RVSP 30 mmHg.    She had an exercise test on 06/30/07, exercising 6:37 minutes; 7.7 METs.  There was atrial fibrillation throughout the test with 2 mm ST depression with activity.  She continued on medical therapy.    She was seen in the office on 10/01/11.  The novel anticoagulation agents were discussed with her and she decided not to use them.  An echocardiogram was done on 10/08/11 noting biatrial enlargement; left  ventricular systolic function at the lower limits of normal; mild aortic valve sclerosis with mild aortic insufficiency; mild mitral and mild tricuspid insufficiency.      She was seen in the office on 01/27/12 and 02/20/13 and continued on medical therapy.    She had an echocardiogram on 09/04/13 which noted that the left ventricle was not dilated; LV systolic function was  normal with a visually estimated ejection fraction is 65%; mild mitral valve regurgitation; dilated left atrium; dilated right atrium; moderate tricuspid valve regurgitation with estimated RVSP 40.3 mmHg.    She was last seen in our office on 06/10/14 and continue on medical therapy including Toprol XL 150 mg po daily, digoxin 0.25 mg po daily and aspirin 325 mg po daily.  Past Medical History:     Past Medical History   Diagnosis Date   . Atrial fibrillation    . Arthritis    . A-fib        Past Surgical History:     Past Surgical History   Procedure Laterality Date   . Hysterectomy         Family History:   No family history on file.    Social History:     Social History          Social History   . Marital Status: Married     Spouse Name: N/A   . Number of Children: N/A   . Years of Education: N/A     Social History Main Topics   . Smoking status: Never Smoker    . Smokeless tobacco: Not on file   . Alcohol Use: No   . Drug Use: No   . Sexual Activity: Not on file     Other Topics Concern   . Not on file     Social History Narrative    ** Merged History Encounter **            Allergies:     Allergies   Allergen Reactions   . Other Other (See Comments)     Mosquito bite causes her to have blisters.       Medications:     Prescriptions prior to admission   Medication Sig   . aspirin EC 325 MG EC tablet Take 325 mg by mouth daily.   . Cholecalciferol (VITAMIN D) 1000 UNIT tablet Take 1,000 Units by mouth daily.   . digoxin (LANOXIN) 0.25 MG tablet Take 250 mcg by mouth.   . metoprolol (TOPROL-XL) 100 MG 24 hr tablet Take 100 mg by mouth daily.   . sertraline (ZOLOFT) 25 MG tablet Take 25 mg by mouth daily.   . Azelastine-Fluticasone (DYMISTA NA) by Nasal route.   . digoxin (LANOXIN) 0.25 MG tablet Take 250 mcg by mouth.   Marland Kitchen HYDROcodone-acetaminophen (NORCO) 5-325 MG per tablet Take 1 tablet by mouth every 6 (six) hours as needed.   . meclizine (ANTIVERT) 12.5 MG tablet Take 25 mg by mouth 3 (three) times daily as needed.   . metoprolol (LOPRESSOR) 100 MG tablet Take 100 mg by mouth 2 (two) times daily.   . naproxen (NAPROSYN) 250 MG tablet Take 1 tablet (250 mg total) by mouth 2 (two) times daily with meals.   . traMADol (ULTRAM) 50 MG tablet Take 50 mg by mouth every 6 (six) hours as needed for Pain.       Current Facility-Administered Medications   Medication Dose Route Frequency Provider Last Rate Last Dose   . 0.9%  NaCl infusion   Intravenous Continuous Eric Form, MD 75 mL/hr at 01/17/16 1145     . acetaminophen (TYLENOL) tablet 650 mg  650 mg Oral 4X Daily PRN Eric Form, MD       . aspirin EC EC tablet 325 mg  325 mg Oral Daily Eric Form, MD   325 mg at 01/17/16  1010   .  digoxin (LANOXIN) tablet 0.125 mg  0.125 mg Oral Daily Eric Form, MD   0.125 mg at 01/17/16 1010   . meclizine (ANTIVERT) tablet 12.5 mg  12.5 mg Oral TID PRN Eric Form, MD       . metoprolol (LOPRESSOR) tablet 50 mg  50 mg Oral BID Eric Form, MD   50 mg at 01/17/16 1009   . naloxone (NARCAN) injection 0.2 mg  0.2 mg Intravenous PRN Eric Form, MD       . naproxen (NAPROSYN) tablet 250 mg  250 mg Oral BID Meals Eric Form, MD   250 mg at 01/17/16 0915   . sertraline (ZOLOFT) tablet 25 mg  25 mg Oral Daily Eric Form, MD   25 mg at 01/17/16 1008         Review of Systems:    Comprehensive review of systems including constitutional, eyes, ears, nose, mouth, throat, cardiovascular, GI, GU, musculoskeletal, integumentary, respiratory, neurologic, psychiatric, and endocrine is negative other than what is mentioned already in the history of present illness    Physical Exam:     Filed Vitals:    01/17/16 1009   BP: 144/73   Pulse:    Temp:    Resp:    SpO2:      Temp (24hrs), Avg:97 F (36.1 C), Min:96 F (35.6 C), Max:97.9 F (36.6 C)      Intake and Output Summary (Last 24 hours) at Date Time    Intake/Output Summary (Last 24 hours) at 01/17/16 1211  Last data filed at 01/17/16 0500   Gross per 24 hour   Intake 613.75 ml   Output      0 ml   Net 613.75 ml       GENERAL: Patient is in no acute distress   HEENT: No scleral icterus or conjunctival pallor, moist mucous membranes   NECK: No jugular venous distention or thyromegaly, normal carotid upstrokes without bruits   CARDIAC: Normal apical impulse, regular rate and rhythm, with normal S1 and S2, and no murmurs, rubs, or gallops, irregularly irregular rhythm  CHEST: Clear to auscultation bilaterally, normal respiratory effort  ABDOMEN: No abdominal bruits, masses, or hepatosplenomegaly, nontender, non-distended, good bowel sounds   EXTREMITIES: No clubbing, cyanosis, or edema, 2+ DP, PT pulses  SKIN: No rash or jaundice    NEUROLOGIC: Alert and oriented to time, place and person, normal mood and affect  MUSCULOSKELETAL: Normal muscle strength and tone.      Labs Reviewed:       Recent Labs  Lab 01/17/16  0950 01/17/16  0335 01/16/16  1947   TROPONIN I <0.01 <0.01 0.01               Recent Labs  Lab 01/16/16  1640   BILIRUBIN, TOTAL 1.0   PROTEIN, TOTAL 7.4   ALBUMIN 3.8   ALT 15   AST (SGOT) 22           Recent Labs  Lab 01/16/16  1640   PT 13.4   PT INR 1.0       Recent Labs  Lab 01/17/16  0336 01/16/16  1640 01/16/16  0649   WBC 7.30 10.62 7.67   HGB 12.1 13.0 13.5   HEMATOCRIT 36.8* 39.2 39.3   PLATELETS 344 372 358       Recent Labs  Lab 01/17/16  0336 01/16/16  1640 01/16/16  0649   SODIUM 140 138 139   POTASSIUM 4.1  4.7 3.8   CHLORIDE 105 99* 103   CO2 25 27 24    BUN 13.0 18.0 16.0   CREATININE 0.6 0.9 0.8   EGFR >60.0 60.0 >60.0   GLUCOSE 85 110* 119*   CALCIUM 9.3 9.8 9.5         Radiology   Radiological Procedure reviewed.      chest X-ray  Assessment:                                                                                   Nausea/vomiting   Syncope - probably vasovagal by history   Chronic atrial fibrillation   Vertigo   Hypertension   Mild mitral valve regurgitation/dilated LA   Moderate tricuspid valve regurgitation/dilated right atrium   Hyperlipidemia   Arthritis      Recommendations:    Continue evaluation and treatment of nausea and vomiting - likely secondary to medication   Maintain vigorous hydration   Agree with lowering dose of toprol XL slightly to 100 mg po daily and continuing low dose of digoxin to control ventricular rate response to atrial fibrillation   Follow blood pressure on lower dose of toprol   Check trough digoxin level   Continue aspirin unless she is willing to begin full anti-coagulation   Check TFTs   F/u outpatient holter monitor in 1-2 weeks        Signed by: Kurtis Bushman, MD    [facility]

## 2016-01-22 ENCOUNTER — Ambulatory Visit: Admission: RE | Admit: 2016-01-22 | Payer: Medicare Other | Source: Ambulatory Visit

## 2016-02-20 ENCOUNTER — Ambulatory Visit (INDEPENDENT_AMBULATORY_CARE_PROVIDER_SITE_OTHER): Payer: Self-pay | Admitting: Cardiovascular Disease

## 2016-03-23 DIAGNOSIS — E7800 Pure hypercholesterolemia, unspecified: Secondary | ICD-10-CM | POA: Diagnosis not present

## 2016-03-23 DIAGNOSIS — G4700 Insomnia, unspecified: Secondary | ICD-10-CM | POA: Diagnosis not present

## 2016-03-23 DIAGNOSIS — I10 Essential (primary) hypertension: Secondary | ICD-10-CM | POA: Diagnosis not present

## 2016-03-23 DIAGNOSIS — M1710 Unilateral primary osteoarthritis, unspecified knee: Secondary | ICD-10-CM | POA: Diagnosis not present

## 2016-03-23 DIAGNOSIS — Z9181 History of falling: Secondary | ICD-10-CM | POA: Diagnosis not present

## 2016-05-05 DIAGNOSIS — Z23 Encounter for immunization: Secondary | ICD-10-CM | POA: Diagnosis not present

## 2016-08-23 ENCOUNTER — Encounter (FREE_STANDING_LABORATORY_FACILITY): Payer: Medicare Other

## 2016-08-23 DIAGNOSIS — M25561 Pain in right knee: Secondary | ICD-10-CM

## 2016-08-23 LAB — BODY FLUID CELL COUNT
Body Fluid Lymphocytes: 4 %
Body Fluid Monocyte/Macrophage Cells: 35 %
Body Fluid Other Cell: 1
Body Fluid Polymorphonuclear Cell: 60 %
Body Fluid RBC: 8150 /mm3
Body Fluid WBC: 2100 /mm3

## 2016-08-24 LAB — BODY FLUID CRYSTAL

## 2016-08-24 LAB — LYME DIS DNA,REAL-TIME PCR,CSF,SYN FLD (SOFT): B burgdorferi DNA: NOT DETECTED

## 2016-08-25 LAB — BODY FLUID PATH REVIEW-

## 2016-08-26 ENCOUNTER — Ambulatory Visit (INDEPENDENT_AMBULATORY_CARE_PROVIDER_SITE_OTHER): Payer: Self-pay | Admitting: Cardiovascular Disease

## 2016-08-26 ENCOUNTER — Other Ambulatory Visit: Payer: Self-pay | Admitting: Cardiovascular Disease

## 2016-08-26 DIAGNOSIS — I4819 Other persistent atrial fibrillation: Secondary | ICD-10-CM

## 2016-08-30 ENCOUNTER — Ambulatory Visit
Admission: RE | Admit: 2016-08-30 | Discharge: 2016-08-30 | Disposition: A | Discharge status: Home / Self Care | Payer: Medicare Other | Source: Ambulatory Visit | Attending: Cardiovascular Disease | Admitting: Cardiovascular Disease

## 2016-08-30 DIAGNOSIS — I272 Pulmonary hypertension, unspecified: Secondary | ICD-10-CM | POA: Insufficient documentation

## 2016-08-30 DIAGNOSIS — I081 Rheumatic disorders of both mitral and tricuspid valves: Secondary | ICD-10-CM | POA: Insufficient documentation

## 2016-08-30 DIAGNOSIS — I481 Persistent atrial fibrillation: Secondary | ICD-10-CM | POA: Insufficient documentation

## 2016-08-30 DIAGNOSIS — I517 Cardiomegaly: Secondary | ICD-10-CM | POA: Insufficient documentation

## 2016-08-30 DIAGNOSIS — I4819 Other persistent atrial fibrillation: Secondary | ICD-10-CM

## 2016-09-20 ENCOUNTER — Encounter: Admission: RE | Payer: Self-pay | Source: Ambulatory Visit

## 2016-09-20 ENCOUNTER — Ambulatory Visit: Admission: RE | Admit: 2016-09-20 | Payer: Medicare Other | Source: Ambulatory Visit | Admitting: Orthopaedic Surgery

## 2016-09-20 SURGERY — RELEASE, CARPAL TUNNEL
Anesthesia: Choice | Site: Finger | Laterality: Right

## 2016-10-04 DIAGNOSIS — E7800 Pure hypercholesterolemia, unspecified: Secondary | ICD-10-CM | POA: Diagnosis not present

## 2016-10-04 DIAGNOSIS — M1710 Unilateral primary osteoarthritis, unspecified knee: Secondary | ICD-10-CM | POA: Diagnosis not present

## 2016-10-04 DIAGNOSIS — Z79899 Other long term (current) drug therapy: Secondary | ICD-10-CM | POA: Diagnosis not present

## 2016-10-04 DIAGNOSIS — G4700 Insomnia, unspecified: Secondary | ICD-10-CM | POA: Diagnosis not present

## 2016-10-04 DIAGNOSIS — I10 Essential (primary) hypertension: Secondary | ICD-10-CM | POA: Diagnosis not present

## 2017-03-29 ENCOUNTER — Ambulatory Visit (INDEPENDENT_AMBULATORY_CARE_PROVIDER_SITE_OTHER): Payer: Self-pay | Admitting: Cardiovascular Disease

## 2017-04-19 DIAGNOSIS — E7800 Pure hypercholesterolemia, unspecified: Secondary | ICD-10-CM | POA: Diagnosis not present

## 2017-04-19 DIAGNOSIS — Z9181 History of falling: Secondary | ICD-10-CM | POA: Diagnosis not present

## 2017-04-19 DIAGNOSIS — Z79899 Other long term (current) drug therapy: Secondary | ICD-10-CM | POA: Diagnosis not present

## 2017-04-19 DIAGNOSIS — I10 Essential (primary) hypertension: Secondary | ICD-10-CM | POA: Diagnosis not present

## 2017-04-19 DIAGNOSIS — M1710 Unilateral primary osteoarthritis, unspecified knee: Secondary | ICD-10-CM | POA: Diagnosis not present

## 2017-04-19 DIAGNOSIS — G4700 Insomnia, unspecified: Secondary | ICD-10-CM | POA: Diagnosis not present

## 2017-04-19 DIAGNOSIS — Z1389 Encounter for screening for other disorder: Secondary | ICD-10-CM | POA: Diagnosis not present

## 2017-04-19 DIAGNOSIS — Z139 Encounter for screening, unspecified: Secondary | ICD-10-CM | POA: Diagnosis not present

## 2017-05-26 DIAGNOSIS — I10 Essential (primary) hypertension: Secondary | ICD-10-CM | POA: Diagnosis not present

## 2017-05-26 DIAGNOSIS — Z6823 Body mass index (BMI) 23.0-23.9, adult: Secondary | ICD-10-CM | POA: Diagnosis not present

## 2017-05-26 DIAGNOSIS — Z23 Encounter for immunization: Secondary | ICD-10-CM | POA: Diagnosis not present

## 2017-07-14 DIAGNOSIS — I10 Essential (primary) hypertension: Secondary | ICD-10-CM | POA: Diagnosis not present

## 2017-07-14 DIAGNOSIS — Z139 Encounter for screening, unspecified: Secondary | ICD-10-CM | POA: Diagnosis not present

## 2017-07-14 DIAGNOSIS — Z0000 Encounter for general adult medical examination without abnormal findings: Secondary | ICD-10-CM | POA: Diagnosis not present

## 2017-07-14 DIAGNOSIS — Z136 Encounter for screening for cardiovascular disorders: Secondary | ICD-10-CM | POA: Diagnosis not present

## 2017-07-14 DIAGNOSIS — E785 Hyperlipidemia, unspecified: Secondary | ICD-10-CM | POA: Diagnosis not present

## 2017-10-12 ENCOUNTER — Ambulatory Visit (INDEPENDENT_AMBULATORY_CARE_PROVIDER_SITE_OTHER): Payer: Self-pay | Admitting: Cardiovascular Disease

## 2018-01-11 DIAGNOSIS — G4700 Insomnia, unspecified: Secondary | ICD-10-CM | POA: Diagnosis not present

## 2018-01-11 DIAGNOSIS — E7800 Pure hypercholesterolemia, unspecified: Secondary | ICD-10-CM | POA: Diagnosis not present

## 2018-01-11 DIAGNOSIS — M170 Bilateral primary osteoarthritis of knee: Secondary | ICD-10-CM | POA: Diagnosis not present

## 2018-01-11 DIAGNOSIS — I10 Essential (primary) hypertension: Secondary | ICD-10-CM | POA: Diagnosis not present

## 2018-01-11 DIAGNOSIS — Z79899 Other long term (current) drug therapy: Secondary | ICD-10-CM | POA: Diagnosis not present

## 2018-01-11 DIAGNOSIS — Z9181 History of falling: Secondary | ICD-10-CM | POA: Diagnosis not present

## 2018-01-11 DIAGNOSIS — Z1331 Encounter for screening for depression: Secondary | ICD-10-CM | POA: Diagnosis not present

## 2018-04-12 ENCOUNTER — Ambulatory Visit (INDEPENDENT_AMBULATORY_CARE_PROVIDER_SITE_OTHER): Payer: Self-pay | Admitting: Cardiovascular Disease

## 2018-07-17 DIAGNOSIS — Z129 Encounter for screening for malignant neoplasm, site unspecified: Secondary | ICD-10-CM | POA: Diagnosis not present

## 2018-07-17 DIAGNOSIS — N959 Unspecified menopausal and perimenopausal disorder: Secondary | ICD-10-CM | POA: Diagnosis not present

## 2018-07-17 DIAGNOSIS — Z0000 Encounter for general adult medical examination without abnormal findings: Secondary | ICD-10-CM | POA: Diagnosis not present

## 2018-07-17 DIAGNOSIS — Z79899 Other long term (current) drug therapy: Secondary | ICD-10-CM | POA: Diagnosis not present

## 2018-07-17 DIAGNOSIS — Z9181 History of falling: Secondary | ICD-10-CM | POA: Diagnosis not present

## 2018-07-17 DIAGNOSIS — Z139 Encounter for screening, unspecified: Secondary | ICD-10-CM | POA: Diagnosis not present

## 2018-07-17 DIAGNOSIS — Z136 Encounter for screening for cardiovascular disorders: Secondary | ICD-10-CM | POA: Diagnosis not present

## 2018-07-17 DIAGNOSIS — I10 Essential (primary) hypertension: Secondary | ICD-10-CM | POA: Diagnosis not present

## 2018-07-24 DIAGNOSIS — I10 Essential (primary) hypertension: Secondary | ICD-10-CM | POA: Diagnosis not present

## 2018-10-12 ENCOUNTER — Ambulatory Visit (INDEPENDENT_AMBULATORY_CARE_PROVIDER_SITE_OTHER): Payer: Self-pay | Admitting: Cardiovascular Disease

## 2019-01-16 DIAGNOSIS — E7800 Pure hypercholesterolemia, unspecified: Secondary | ICD-10-CM | POA: Diagnosis not present

## 2019-01-16 DIAGNOSIS — G4700 Insomnia, unspecified: Secondary | ICD-10-CM | POA: Diagnosis not present

## 2019-01-16 DIAGNOSIS — M1710 Unilateral primary osteoarthritis, unspecified knee: Secondary | ICD-10-CM | POA: Diagnosis not present

## 2019-01-16 DIAGNOSIS — Z79899 Other long term (current) drug therapy: Secondary | ICD-10-CM | POA: Diagnosis not present

## 2019-01-16 DIAGNOSIS — I10 Essential (primary) hypertension: Secondary | ICD-10-CM | POA: Diagnosis not present

## 2019-03-23 ENCOUNTER — Ambulatory Visit (INDEPENDENT_AMBULATORY_CARE_PROVIDER_SITE_OTHER): Payer: Self-pay | Admitting: Cardiovascular Disease

## 2019-07-19 DIAGNOSIS — M1710 Unilateral primary osteoarthritis, unspecified knee: Secondary | ICD-10-CM | POA: Diagnosis not present

## 2019-07-19 DIAGNOSIS — J449 Chronic obstructive pulmonary disease, unspecified: Secondary | ICD-10-CM | POA: Diagnosis not present

## 2019-07-19 DIAGNOSIS — Z9181 History of falling: Secondary | ICD-10-CM | POA: Diagnosis not present

## 2019-07-19 DIAGNOSIS — Z139 Encounter for screening, unspecified: Secondary | ICD-10-CM | POA: Diagnosis not present

## 2019-07-19 DIAGNOSIS — G4700 Insomnia, unspecified: Secondary | ICD-10-CM | POA: Diagnosis not present

## 2019-07-19 DIAGNOSIS — I10 Essential (primary) hypertension: Secondary | ICD-10-CM | POA: Diagnosis not present

## 2019-07-23 DIAGNOSIS — E785 Hyperlipidemia, unspecified: Secondary | ICD-10-CM | POA: Diagnosis not present

## 2019-07-23 DIAGNOSIS — Z9181 History of falling: Secondary | ICD-10-CM | POA: Diagnosis not present

## 2019-07-23 DIAGNOSIS — Z0000 Encounter for general adult medical examination without abnormal findings: Secondary | ICD-10-CM | POA: Diagnosis not present

## 2019-09-20 DIAGNOSIS — Z6823 Body mass index (BMI) 23.0-23.9, adult: Secondary | ICD-10-CM | POA: Diagnosis not present

## 2019-09-20 DIAGNOSIS — M25552 Pain in left hip: Secondary | ICD-10-CM | POA: Diagnosis not present

## 2019-09-26 DIAGNOSIS — M25552 Pain in left hip: Secondary | ICD-10-CM | POA: Diagnosis not present

## 2019-09-26 DIAGNOSIS — M545 Low back pain: Secondary | ICD-10-CM | POA: Diagnosis not present

## 2019-10-02 DIAGNOSIS — H353220 Exudative age-related macular degeneration, left eye, stage unspecified: Secondary | ICD-10-CM | POA: Diagnosis not present

## 2019-10-02 DIAGNOSIS — H5203 Hypermetropia, bilateral: Secondary | ICD-10-CM | POA: Diagnosis not present

## 2019-10-02 DIAGNOSIS — M205X2 Other deformities of toe(s) (acquired), left foot: Secondary | ICD-10-CM | POA: Diagnosis not present

## 2019-10-02 DIAGNOSIS — I739 Peripheral vascular disease, unspecified: Secondary | ICD-10-CM | POA: Diagnosis not present

## 2019-10-02 DIAGNOSIS — B351 Tinea unguium: Secondary | ICD-10-CM | POA: Diagnosis not present

## 2019-10-02 DIAGNOSIS — M205X1 Other deformities of toe(s) (acquired), right foot: Secondary | ICD-10-CM | POA: Diagnosis not present

## 2019-10-05 DIAGNOSIS — M25552 Pain in left hip: Secondary | ICD-10-CM | POA: Diagnosis not present

## 2019-10-06 ENCOUNTER — Other Ambulatory Visit (HOSPITAL_COMMUNITY)
Admission: RE | Admit: 2019-10-06 | Discharge: 2019-10-06 | Disposition: A | Discharge status: Home / Self Care | Payer: Medicare Other | Source: Ambulatory Visit | Attending: Orthopedic Surgery | Admitting: Orthopedic Surgery

## 2019-10-06 DIAGNOSIS — Z791 Long term (current) use of non-steroidal anti-inflammatories (NSAID): Secondary | ICD-10-CM | POA: Diagnosis not present

## 2019-10-06 DIAGNOSIS — C7951 Secondary malignant neoplasm of bone: Secondary | ICD-10-CM | POA: Diagnosis not present

## 2019-10-06 DIAGNOSIS — Z20822 Contact with and (suspected) exposure to covid-19: Secondary | ICD-10-CM | POA: Diagnosis not present

## 2019-10-06 DIAGNOSIS — Z79899 Other long term (current) drug therapy: Secondary | ICD-10-CM | POA: Diagnosis not present

## 2019-10-06 DIAGNOSIS — I10 Essential (primary) hypertension: Secondary | ICD-10-CM | POA: Diagnosis not present

## 2019-10-06 DIAGNOSIS — M84452A Pathological fracture, left femur, initial encounter for fracture: Secondary | ICD-10-CM | POA: Diagnosis not present

## 2019-10-06 LAB — SARS CORONAVIRUS 2 (TAT 6-24 HRS): SARS Coronavirus 2: NEGATIVE

## 2019-10-08 ENCOUNTER — Ambulatory Visit (HOSPITAL_COMMUNITY): Payer: Medicare Other | Admitting: Certified Registered"

## 2019-10-08 ENCOUNTER — Encounter: Payer: Self-pay | Admitting: Hematology & Oncology

## 2019-10-08 ENCOUNTER — Other Ambulatory Visit: Payer: Self-pay

## 2019-10-08 ENCOUNTER — Inpatient Hospital Stay (HOSPITAL_COMMUNITY): Payer: Medicare Other

## 2019-10-08 ENCOUNTER — Encounter (HOSPITAL_COMMUNITY)
Admission: RE | Disposition: A | Discharge status: Home / Self Care | Payer: Self-pay | Source: Home / Self Care | Attending: Orthopedic Surgery

## 2019-10-08 ENCOUNTER — Inpatient Hospital Stay (HOSPITAL_COMMUNITY)
Admission: RE | Admit: 2019-10-08 | Discharge: 2019-10-10 | DRG: 522 | Disposition: A | Discharge status: Home Health | Payer: Medicare Other | Attending: Orthopedic Surgery | Admitting: Orthopedic Surgery

## 2019-10-08 ENCOUNTER — Encounter (HOSPITAL_COMMUNITY): Payer: Self-pay | Admitting: Orthopedic Surgery

## 2019-10-08 DIAGNOSIS — Z01818 Encounter for other preprocedural examination: Secondary | ICD-10-CM

## 2019-10-08 DIAGNOSIS — C7951 Secondary malignant neoplasm of bone: Secondary | ICD-10-CM | POA: Diagnosis present

## 2019-10-08 DIAGNOSIS — Z20822 Contact with and (suspected) exposure to covid-19: Secondary | ICD-10-CM | POA: Diagnosis present

## 2019-10-08 DIAGNOSIS — S72042A Displaced fracture of base of neck of left femur, initial encounter for closed fracture: Secondary | ICD-10-CM | POA: Diagnosis not present

## 2019-10-08 DIAGNOSIS — Z8781 Personal history of (healed) traumatic fracture: Secondary | ICD-10-CM

## 2019-10-08 DIAGNOSIS — Z791 Long term (current) use of non-steroidal anti-inflammatories (NSAID): Secondary | ICD-10-CM

## 2019-10-08 DIAGNOSIS — I10 Essential (primary) hypertension: Secondary | ICD-10-CM | POA: Diagnosis present

## 2019-10-08 DIAGNOSIS — J449 Chronic obstructive pulmonary disease, unspecified: Secondary | ICD-10-CM | POA: Diagnosis not present

## 2019-10-08 DIAGNOSIS — M84452A Pathological fracture, left femur, initial encounter for fracture: Principal | ICD-10-CM | POA: Diagnosis present

## 2019-10-08 DIAGNOSIS — Z79899 Other long term (current) drug therapy: Secondary | ICD-10-CM

## 2019-10-08 DIAGNOSIS — C801 Malignant (primary) neoplasm, unspecified: Secondary | ICD-10-CM | POA: Diagnosis not present

## 2019-10-08 DIAGNOSIS — J189 Pneumonia, unspecified organism: Secondary | ICD-10-CM | POA: Diagnosis not present

## 2019-10-08 DIAGNOSIS — Z96642 Presence of left artificial hip joint: Secondary | ICD-10-CM | POA: Diagnosis not present

## 2019-10-08 DIAGNOSIS — Z471 Aftercare following joint replacement surgery: Secondary | ICD-10-CM | POA: Diagnosis not present

## 2019-10-08 HISTORY — DX: Essential (primary) hypertension: I10

## 2019-10-08 HISTORY — DX: Elevated blood-pressure reading, without diagnosis of hypertension: R03.0

## 2019-10-08 HISTORY — PX: HIP ARTHROPLASTY: SHX981

## 2019-10-08 HISTORY — DX: Pneumonia, unspecified organism: J18.9

## 2019-10-08 LAB — COMPREHENSIVE METABOLIC PANEL
ALT: 11 U/L (ref 0–44)
AST: 14 U/L — ABNORMAL LOW (ref 15–41)
Albumin: 3.7 g/dL (ref 3.5–5.0)
Alkaline Phosphatase: 83 U/L (ref 38–126)
Anion gap: 7 (ref 5–15)
BUN: 25 mg/dL — ABNORMAL HIGH (ref 8–23)
CO2: 29 mmol/L (ref 22–32)
Calcium: 9.9 mg/dL (ref 8.9–10.3)
Chloride: 102 mmol/L (ref 98–111)
Creatinine, Ser: 0.82 mg/dL (ref 0.44–1.00)
GFR calc Af Amer: >60 mL/min (ref >60)
GFR calc non Af Amer: >60 mL/min (ref >60)
Glucose, Bld: 113 mg/dL — ABNORMAL HIGH (ref 70–99)
Potassium: 4.1 mmol/L (ref 3.5–5.1)
Sodium: 138 mmol/L (ref 135–145)
Total Bilirubin: 1 mg/dL (ref 0.3–1.2)
Total Protein: 7.3 g/dL (ref 6.5–8.1)

## 2019-10-08 LAB — CBC
HCT: 35.8 % — ABNORMAL LOW (ref 36.0–46.0)
Hemoglobin: 12 g/dL (ref 12.0–15.0)
MCH: 31.5 pg (ref 26.0–34.0)
MCHC: 33.5 g/dL (ref 30.0–36.0)
MCV: 94 fL (ref 80.0–100.0)
Platelets: 329 10*3/uL (ref 150–400)
RBC: 3.81 MIL/uL — ABNORMAL LOW (ref 3.87–5.11)
RDW: 12.4 % (ref 11.5–15.5)
WBC: 10 10*3/uL (ref 4.0–10.5)
nRBC: 0 % (ref 0.0–0.2)

## 2019-10-08 LAB — TYPE AND SCREEN
ABO/RH(D): O POS
Antibody Screen: NEGATIVE

## 2019-10-08 LAB — PROTIME-INR
INR: 1 (ref 0.8–1.2)
Prothrombin Time: 12.6 seconds (ref 11.4–15.2)

## 2019-10-08 LAB — ABO/RH: ABO/RH(D): O POS

## 2019-10-08 SURGERY — HEMIARTHROPLASTY, HIP, DIRECT ANTERIOR APPROACH, FOR FRACTURE
Anesthesia: Monitor Anesthesia Care | Site: Hip | Laterality: Left

## 2019-10-08 MED ORDER — LIDOCAINE 2% (20 MG/ML) 5 ML SYRINGE
INTRAMUSCULAR | Status: DC | PRN
  Administered 2019-10-08: 60 mg via INTRAVENOUS

## 2019-10-08 MED ORDER — VITAMIN A 3 MG (10000 UNIT) PO CAPS
10000.0000 [IU] | ORAL_CAPSULE | Freq: Every day | ORAL | Status: DC
  Administered 2019-10-08 – 2019-10-10 (×3): 10000 [IU] via ORAL
  Filled 2019-10-08 (×3): qty 1

## 2019-10-08 MED ORDER — POLYVINYL ALCOHOL 1.4 % OP SOLN
1.0000 [drp] | OPHTHALMIC | Status: DC | PRN
  Filled 2019-10-08: qty 15

## 2019-10-08 MED ORDER — CEFAZOLIN SODIUM-DEXTROSE 2-4 GM/100ML-% IV SOLN
2.0000 g | INTRAVENOUS | Status: AC
  Administered 2019-10-08: 2 g via INTRAVENOUS
  Filled 2019-10-08: qty 100

## 2019-10-08 MED ORDER — ENOXAPARIN SODIUM 40 MG/0.4ML ~~LOC~~ SOLN: 40.0000 mg | SUBCUTANEOUS | 0 refills | Status: DC

## 2019-10-08 MED ORDER — CEFAZOLIN SODIUM-DEXTROSE 2-4 GM/100ML-% IV SOLN
2.0000 g | Freq: Four times a day (QID) | INTRAVENOUS | Status: AC
  Administered 2019-10-08 – 2019-10-09 (×2): 2 g via INTRAVENOUS
  Filled 2019-10-08 (×2): qty 100

## 2019-10-08 MED ORDER — MAGNESIUM CITRATE PO SOLN: 1.0000 | Freq: Once | ORAL | Status: DC | PRN

## 2019-10-08 MED ORDER — PHENOL 1.4 % MT LIQD: 1.0000 | OROMUCOSAL | Status: DC | PRN

## 2019-10-08 MED ORDER — FENTANYL CITRATE (PF) 100 MCG/2ML IJ SOLN: 25.0000 ug | INTRAMUSCULAR | Status: DC | PRN

## 2019-10-08 MED ORDER — TEMAZEPAM 15 MG PO CAPS
15.0000 mg | ORAL_CAPSULE | Freq: Every day | ORAL | Status: DC
  Administered 2019-10-08 – 2019-10-09 (×2): 15 mg via ORAL
  Filled 2019-10-08 (×2): qty 1

## 2019-10-08 MED ORDER — DOCUSATE SODIUM 100 MG PO CAPS
100.0000 mg | ORAL_CAPSULE | Freq: Two times a day (BID) | ORAL | Status: DC
  Administered 2019-10-08 – 2019-10-10 (×4): 100 mg via ORAL
  Filled 2019-10-08 (×4): qty 1

## 2019-10-08 MED ORDER — FERROUS SULFATE 325 (65 FE) MG PO TABS
325.0000 mg | ORAL_TABLET | Freq: Three times a day (TID) | ORAL | Status: DC
  Administered 2019-10-10: 325 mg via ORAL
  Filled 2019-10-08 (×3): qty 1

## 2019-10-08 MED ORDER — ONDANSETRON HCL 4 MG PO TABS: 4.0000 mg | ORAL_TABLET | Freq: Four times a day (QID) | ORAL | Status: DC | PRN

## 2019-10-08 MED ORDER — HYDROCODONE-ACETAMINOPHEN 7.5-325 MG PO TABS: 1.0000 | ORAL_TABLET | ORAL | Status: DC | PRN

## 2019-10-08 MED ORDER — LACTATED RINGERS IV SOLN: INTRAVENOUS | Status: DC

## 2019-10-08 MED ORDER — DEXAMETHASONE SODIUM PHOSPHATE 10 MG/ML IJ SOLN
INTRAMUSCULAR | Status: AC
  Filled 2019-10-08: qty 1

## 2019-10-08 MED ORDER — ENSURE PRE-SURGERY PO LIQD
296.0000 mL | Freq: Once | ORAL | Status: DC
  Filled 2019-10-08: qty 296

## 2019-10-08 MED ORDER — POTASSIUM CHLORIDE IN NACL 20-0.45 MEQ/L-% IV SOLN
INTRAVENOUS | Status: DC
  Filled 2019-10-08 (×3): qty 1000

## 2019-10-08 MED ORDER — PROPOFOL 10 MG/ML IV BOLUS
INTRAVENOUS | Status: AC
  Filled 2019-10-08: qty 20

## 2019-10-08 MED ORDER — PROPOFOL 500 MG/50ML IV EMUL
INTRAVENOUS | Status: DC | PRN
  Administered 2019-10-08: 50 ug/kg/min via INTRAVENOUS
  Administered 2019-10-08: 30 mg via INTRAVENOUS
  Administered 2019-10-08: 20 mg via INTRAVENOUS

## 2019-10-08 MED ORDER — ONDANSETRON HCL 4 MG PO TABS: 4.0000 mg | ORAL_TABLET | Freq: Three times a day (TID) | ORAL | 0 refills | Status: DC | PRN

## 2019-10-08 MED ORDER — BISACODYL 10 MG RE SUPP: 10.0000 mg | Freq: Every day | RECTAL | Status: DC | PRN

## 2019-10-08 MED ORDER — ONDANSETRON HCL 4 MG/2ML IJ SOLN
INTRAMUSCULAR | Status: DC | PRN
  Administered 2019-10-08: 4 mg via INTRAVENOUS

## 2019-10-08 MED ORDER — VITAMIN D3 25 MCG (1000 UNIT) PO TABS
1000.0000 [IU] | ORAL_TABLET | Freq: Every day | ORAL | Status: DC
  Administered 2019-10-08 – 2019-10-10 (×3): 1000 [IU] via ORAL
  Filled 2019-10-08 (×5): qty 1

## 2019-10-08 MED ORDER — POLYETHYLENE GLYCOL 3350 17 G PO PACK: 17.0000 g | PACK | Freq: Every day | ORAL | Status: DC | PRN

## 2019-10-08 MED ORDER — LIDOCAINE 2% (20 MG/ML) 5 ML SYRINGE
INTRAMUSCULAR | Status: AC
  Filled 2019-10-08: qty 5

## 2019-10-08 MED ORDER — SENNA-DOCUSATE SODIUM 8.6-50 MG PO TABS: 2.0000 | ORAL_TABLET | Freq: Every day | ORAL | 1 refills | Status: AC

## 2019-10-08 MED ORDER — KETOROLAC TROMETHAMINE 15 MG/ML IJ SOLN
7.5000 mg | Freq: Four times a day (QID) | INTRAMUSCULAR | Status: AC
  Administered 2019-10-08 – 2019-10-09 (×4): 7.5 mg via INTRAVENOUS
  Filled 2019-10-08 (×4): qty 1

## 2019-10-08 MED ORDER — FENTANYL CITRATE (PF) 100 MCG/2ML IJ SOLN
INTRAMUSCULAR | Status: AC
  Filled 2019-10-08: qty 2

## 2019-10-08 MED ORDER — ONDANSETRON HCL 4 MG/2ML IJ SOLN: 4.0000 mg | Freq: Once | INTRAMUSCULAR | Status: DC | PRN

## 2019-10-08 MED ORDER — SENNA 8.6 MG PO TABS
1.0000 | ORAL_TABLET | Freq: Two times a day (BID) | ORAL | Status: DC
  Administered 2019-10-08 – 2019-10-10 (×4): 8.6 mg via ORAL
  Filled 2019-10-08 (×4): qty 1

## 2019-10-08 MED ORDER — VANCOMYCIN HCL 1000 MG IV SOLR
INTRAVENOUS | Status: AC
  Filled 2019-10-08: qty 1000

## 2019-10-08 MED ORDER — CHLORHEXIDINE GLUCONATE 4 % EX LIQD: 60.0000 mL | Freq: Once | CUTANEOUS | Status: DC

## 2019-10-08 MED ORDER — ALUM & MAG HYDROXIDE-SIMETH 200-200-20 MG/5ML PO SUSP: 30.0000 mL | ORAL | Status: DC | PRN

## 2019-10-08 MED ORDER — ACETAMINOPHEN 325 MG PO TABS
325.0000 mg | ORAL_TABLET | Freq: Four times a day (QID) | ORAL | Status: DC | PRN
  Administered 2019-10-09 – 2019-10-10 (×3): 650 mg via ORAL
  Filled 2019-10-08 (×3): qty 2

## 2019-10-08 MED ORDER — FENTANYL CITRATE (PF) 100 MCG/2ML IJ SOLN
INTRAMUSCULAR | Status: DC | PRN
  Administered 2019-10-08: 50 ug via INTRAVENOUS

## 2019-10-08 MED ORDER — BUPIVACAINE IN DEXTROSE 0.75-8.25 % IT SOLN
INTRATHECAL | Status: DC | PRN
  Administered 2019-10-08: 1.8 mL via INTRATHECAL

## 2019-10-08 MED ORDER — DEXAMETHASONE SODIUM PHOSPHATE 10 MG/ML IJ SOLN
INTRAMUSCULAR | Status: DC | PRN
  Administered 2019-10-08: 10 mg via INTRAVENOUS

## 2019-10-08 MED ORDER — BUPIVACAINE HCL 0.25 % IJ SOLN
INTRAMUSCULAR | Status: AC
  Filled 2019-10-08: qty 1

## 2019-10-08 MED ORDER — BUPIVACAINE HCL (PF) 0.25 % IJ SOLN
INTRAMUSCULAR | Status: DC | PRN
  Administered 2019-10-08: 40 mL via INTRA_ARTICULAR

## 2019-10-08 MED ORDER — POVIDONE-IODINE 10 % EX SWAB
2.0000 "application " | Freq: Once | CUTANEOUS | Status: AC
  Administered 2019-10-08: 2 via TOPICAL

## 2019-10-08 MED ORDER — PHENYLEPHRINE HCL-NACL 10-0.9 MG/250ML-% IV SOLN
INTRAVENOUS | Status: DC | PRN
  Administered 2019-10-08: 15 ug/min via INTRAVENOUS

## 2019-10-08 MED ORDER — ENOXAPARIN SODIUM 40 MG/0.4ML ~~LOC~~ SOLN
40.0000 mg | SUBCUTANEOUS | Status: DC
  Administered 2019-10-09 – 2019-10-10 (×2): 40 mg via SUBCUTANEOUS
  Filled 2019-10-08 (×2): qty 0.4

## 2019-10-08 MED ORDER — ONDANSETRON HCL 4 MG/2ML IJ SOLN: 4.0000 mg | Freq: Four times a day (QID) | INTRAMUSCULAR | Status: DC | PRN

## 2019-10-08 MED ORDER — MENTHOL 3 MG MT LOZG: 1.0000 | LOZENGE | OROMUCOSAL | Status: DC | PRN

## 2019-10-08 MED ORDER — AMITRIPTYLINE HCL 10 MG PO TABS
10.0000 mg | ORAL_TABLET | Freq: Every day | ORAL | Status: DC
  Administered 2019-10-08 – 2019-10-10 (×3): 10 mg via ORAL
  Filled 2019-10-08 (×3): qty 1

## 2019-10-08 MED ORDER — ONDANSETRON HCL 4 MG/2ML IJ SOLN
INTRAMUSCULAR | Status: AC
  Filled 2019-10-08: qty 2

## 2019-10-08 MED ORDER — PROPRANOLOL HCL 40 MG PO TABS
40.0000 mg | ORAL_TABLET | Freq: Every day | ORAL | Status: DC
  Administered 2019-10-08 – 2019-10-10 (×3): 40 mg via ORAL
  Filled 2019-10-08 (×3): qty 1

## 2019-10-08 MED ORDER — HYDROCODONE-ACETAMINOPHEN 5-325 MG PO TABS: 1.0000 | ORAL_TABLET | ORAL | Status: DC | PRN

## 2019-10-08 MED ORDER — ACETAMINOPHEN 500 MG PO TABS
500.0000 mg | ORAL_TABLET | Freq: Four times a day (QID) | ORAL | Status: AC
  Administered 2019-10-08 – 2019-10-09 (×4): 500 mg via ORAL
  Filled 2019-10-08 (×4): qty 1

## 2019-10-08 MED ORDER — PROPOFOL 1000 MG/100ML IV EMUL
INTRAVENOUS | Status: AC
  Filled 2019-10-08: qty 100

## 2019-10-08 MED ORDER — ACETAMINOPHEN 500 MG PO TABS
1000.0000 mg | ORAL_TABLET | Freq: Once | ORAL | Status: AC
  Administered 2019-10-08: 1000 mg via ORAL
  Filled 2019-10-08: qty 2

## 2019-10-08 SURGICAL SUPPLY — 55 items
BIT DRILL 2.0X128 (BIT) ×2 IMPLANT
BIT DRILL 2.0X128MM (BIT) ×1
BLADE SAW SAG 73X25 THK (BLADE) ×1
BLADE SAW SGTL 73X25 THK (BLADE) ×2 IMPLANT
CLOSURE STERI-STRIP 1/2X4 (GAUZE/BANDAGES/DRESSINGS) ×2
CLSR STERI-STRIP ANTIMIC 1/2X4 (GAUZE/BANDAGES/DRESSINGS) ×4 IMPLANT
COVER SURGICAL LIGHT HANDLE (MISCELLANEOUS) ×3 IMPLANT
COVER WAND RF STERILE (DRAPES) IMPLANT
DRAPE INCISE IOBAN 66X45 STRL (DRAPES) ×6 IMPLANT
DRAPE ORTHO SPLIT 77X108 STRL (DRAPES) ×6
DRAPE SURG ORHT 6 SPLT 77X108 (DRAPES) ×2 IMPLANT
DRAPE U-SHAPE 47X51 STRL (DRAPES) ×3 IMPLANT
DRESSING AQUACEL AG SP 3.5X6 (GAUZE/BANDAGES/DRESSINGS) ×1 IMPLANT
DRSG AQUACEL AG SP 3.5X6 (GAUZE/BANDAGES/DRESSINGS) ×3
DRSG MEPILEX BORDER 4X8 (GAUZE/BANDAGES/DRESSINGS) ×3 IMPLANT
DURAPREP 26ML APPLICATOR (WOUND CARE) ×6 IMPLANT
ELECT REM PT RETURN 15FT ADLT (MISCELLANEOUS) ×3 IMPLANT
GLOVE BIO SURGEON STRL SZ7 (GLOVE) ×3 IMPLANT
GLOVE BIOGEL PI IND STRL 7.0 (GLOVE) ×1 IMPLANT
GLOVE BIOGEL PI IND STRL 8 (GLOVE) ×6 IMPLANT
GLOVE BIOGEL PI INDICATOR 7.0 (GLOVE) ×2
GLOVE BIOGEL PI INDICATOR 8 (GLOVE) ×12
GLOVE ORTHO TXT STRL SZ7.5 (GLOVE) ×3 IMPLANT
GOWN STRL REUS W/TWL LRG LVL3 (GOWN DISPOSABLE) ×12 IMPLANT
HANDPIECE INTERPULSE COAX TIP (DISPOSABLE)
HEAD FEM UNIPOLAR 48 OD (Hips) ×3 IMPLANT
KIT BASIN OR (CUSTOM PROCEDURE TRAY) ×3 IMPLANT
KIT TURNOVER KIT A (KITS) ×3 IMPLANT
NDL SAFETY ECLIPSE 18X1.5 (NEEDLE) ×1 IMPLANT
NEEDLE HYPO 18GX1.5 SHARP (NEEDLE) ×3
NS IRRIG 1000ML POUR BTL (IV SOLUTION) ×3 IMPLANT
PACK TOTAL JOINT (CUSTOM PROCEDURE TRAY) ×3 IMPLANT
PACK TOTAL KNEE CUSTOM (KITS) IMPLANT
PENCIL SMOKE EVACUATOR (MISCELLANEOUS) IMPLANT
PROTECTOR NERVE ULNAR (MISCELLANEOUS) ×3 IMPLANT
RETRIEVER SUT HEWSON (MISCELLANEOUS) ×3 IMPLANT
SET HNDPC FAN SPRY TIP SCT (DISPOSABLE) IMPLANT
SPACER DEPUY (Hips) ×3 IMPLANT
SPONGE LAP 4X18 RFD (DISPOSABLE) IMPLANT
STEM SUMMIT BASIC PRESSFIT SZ4 (Hips) ×3 IMPLANT
SUCTION FRAZIER HANDLE 12FR (TUBING) ×3
SUCTION TUBE FRAZIER 12FR DISP (TUBING) ×1 IMPLANT
SUT ETHIBOND NAB CT1 #1 30IN (SUTURE) ×3 IMPLANT
SUT FIBERWIRE #2 38 T-5 BLUE (SUTURE) ×6
SUT MNCRL AB 4-0 PS2 18 (SUTURE) ×3 IMPLANT
SUT VIC AB 1 CT1 27 (SUTURE)
SUT VIC AB 1 CT1 27XBRD ANTBC (SUTURE) IMPLANT
SUT VIC AB 2-0 CT1 27 (SUTURE) ×6
SUT VIC AB 2-0 CT1 TAPERPNT 27 (SUTURE) ×2 IMPLANT
SUT VIC AB 3-0 SH 8-18 (SUTURE) ×3 IMPLANT
SUTURE FIBERWR #2 38 T-5 BLUE (SUTURE) ×2 IMPLANT
TOWEL OR 17X26 10 PK STRL BLUE (TOWEL DISPOSABLE) ×3 IMPLANT
TOWER CARTRIDGE SMART MIX (DISPOSABLE) IMPLANT
TRAY FOLEY MTR SLVR 16FR STAT (SET/KITS/TRAYS/PACK) ×3 IMPLANT
WATER STERILE IRR 1000ML POUR (IV SOLUTION) ×6 IMPLANT

## 2019-10-08 NOTE — Anesthesia Procedure Notes (Addendum)
Spinal  Patient location during procedure: OR Start time: 10/08/2019 2:20 PM End time: 10/08/2019 2:28 PM Preanesthetic Checklist Completed: patient identified, IV checked, site marked, risks and benefits discussed, surgical consent, monitors and equipment checked, pre-op evaluation and timeout performed Spinal Block Patient position: sitting Prep: DuraPrep Patient monitoring: heart rate, cardiac monitor, continuous pulse ox and blood pressure Approach: midline Location: L3-4 Injection technique: single-shot Needle Needle type: Sprotte  Needle gauge: 24 G Needle length: 9 cm Assessment Sensory level: T4

## 2019-10-08 NOTE — Op Note (Signed)
10/08/2019  4:11 PM  PATIENT:  Stephanie Duran   MRN: [id number]  PRE-OPERATIVE DIAGNOSIS: Pathologic left femoral neck fracture  POST-OPERATIVE DIAGNOSIS: Same  PROCEDURE:  Procedure(s): Left hip hemiarthroplasty  PREOPERATIVE INDICATIONS:  Stephanie Duran is an 86 y.o. female who had a fall about 3 months ago and complained of persistent left hip pain.  She had an MRI preoperatively that demonstrated a pathologic femoral neck fracture.  Primary lesion unknown.  It is possible this is simply a chronic femoral neck fracture that has not healed, regardless she was unable to bear weight, and elected for surgical management. The risks benefits and alternatives were discussed with the patient including but not limited to the risks of nonoperative treatment, versus surgical intervention including infection, bleeding, nerve injury, periprosthetic fracture, the need for revision surgery, dislocation, leg length discrepancy, blood clots, cardiopulmonary complications, morbidity, mortality, among others, and they were willing to proceed.  Predicted outcome is good, although there will be at least a six to nine month expected recovery.   OPERATIVE REPORT     SURGEON:  Marchia Bond, MD    ASSISTANT:  Merlene Pulling, PA-C,   (Present throughout the entire procedure,  necessary for completion of procedure in a timely manner, assisting with retraction, instrumentation, and closure)     ANESTHESIA:  spinal  ESTIMATED BLOOD LOSS: 300 ml  Operative specimens: Femoral head and neck sent to pathology for analysis.    COMPLICATIONS:  None.   UNIQUE ASPECTS OF THE CASE: The femoral neck was in fact fractured, and there was abnormal tissue surrounding it.  Not sure if this was but represented an attempt at union, or pathology from metastatic disease.  The bone quality was fairly poor, the fracture itself extended distally to just above the level of the lesser trochanter.  I did not take the entirety of that bone  however, in fact I potted the stem slightly proud in order to maximize fixation, and restore length.  During the first trial reduction I had soft tissue that was incarcerated, and on the final placement, she was very stiff and so I actually redislocated her to make sure that there was no soft tissue entrapped, and there was probably a small amount of the superior capsule that was engaging which I resected in order to allow a smooth arc of motion.     COMPONENTS:  Chemical engineer Femoral Fracture stem size 4, with a 48 spacer and a size -3 fracture head unipolar hip ball.    PROCEDURE IN DETAIL: The patient was met in the holding area and identified.  The appropriate hip  was marked at the operative site. The patient was then transported to the OR and  placed under anesthesia.  At that point, the patient was  placed in the lateral decubitus position with the operative side up and  secured to the operating room table and all bony prominences padded.     The operative lower extremity was prepped from the iliac crest to the toes.  Sterile draping was performed.  Time out was performed prior to incision.      A routine posterolateral approach was utilized via sharp dissection  carried down to the subcutaneous tissue.  Gross bleeders were Bovie  coagulated.  The iliotibial band was identified and incised  along the length of the skin incision.  Self-retaining retractors were  inserted.  With the hip internally rotated, the short external rotators  were identified. The piriformis was tagged  with FiberWire, and the hip capsule released in a T-type fashion.  The femoral neck was exposed, and I resected the femoral neck using the appropriate jig. This was performed at approximately a thumb's breadth above the lesser trochanter.    I then exposed the deep acetabulum, cleared out any tissue including the ligamentum teres, and included the hip capsule in the FiberWire used above and below the T.    I  then prepared the proximal femur using the cookie-cutter, the lateralizing reamer, and then sequentially broached.  A trial utilized, and I reduced the hip and it was found to have excellent stability with functional range of motion. The trial components were then removed.   I then place the real implant and I impacted the real head ball into place. The hip was then reduced and taken through functional range of motion and found to have excellent stability. Leg lengths were restored.  I then used a 2 mm drill bits to pass the FiberWire suture from the capsule and piriformis through the greater trochanter, and secured this. Excellent posterior capsular repair was achieved. I also closed the T in the capsule.  I then irrigated the hip copiously again with pulse lavage, and repaired the fascia with Vicryl, followed by Vicryl for the subcutaneous tissue, Monocryl for the skin, Steri-Strips and sterile gauze. The wounds were injected. The patient was then awakened and returned to PACU in stable and satisfactory condition. There were no complications.  Marchia Bond, MD Orthopedic Surgeon [phone]   10/08/2019 4:11 PM

## 2019-10-08 NOTE — H&P (Signed)
PREOPERATIVE H&P  Chief Complaint: left hip pain  HPI: Stephanie Duran is a 86 y.o. female who presents for preoperative history and physical with a diagnosis of left pathologic femoral neck fracture after a fall 3 months ago. . Symptoms are rated as moderate to severe, and have been worsening.  This is significantly impairing activities of daily living.  She has elected for surgical management. Pain 10/10 around left hip and back.    Past Medical History:  Diagnosis Date  . Borderline systolic HTN   . Hypertension   . Pneumonia     Social History   Socioeconomic History  . Marital status: Widowed    Spouse name: Not on file  . Number of children: Not on file  . Years of education: Not on file  . Highest education level: Not on file  Occupational History  . Not on file  Tobacco Use  . Smoking status: Current Every Day Smoker    Packs/day: 1.00    Years: 15.00    Pack years: 15.00    Types: Cigarettes  Substance and Sexual Activity  . Alcohol use: Not on file  . Drug use: Not on file  . Sexual activity: Not on file  Other Topics Concern  . Not on file  Social History Narrative  . Not on file   Social Determinants of Health   Financial Resource Strain:   . Difficulty of Paying Living Expenses:   Food Insecurity:   . Worried About Charity fundraiser in the Last Year:   . Arboriculturist in the Last Year:   Transportation Needs:   . Film/video editor (Medical):   Marland Kitchen Lack of Transportation (Non-Medical):   Physical Activity:   . Days of Exercise per Week:   . Minutes of Exercise per Session:   Stress:   . Feeling of Stress :   Social Connections:   . Frequency of Communication with Friends and Family:   . Frequency of Social Gatherings with Friends and Family:   . Attends Religious Services:   . Active Member of Clubs or Organizations:   . Attends Archivist Meetings:   Marland Kitchen Marital Status:    No family history on file. Allergies  Allergen  Reactions  . Valium [Diazepam]     Anxious/ " makes me climb walls"   Prior to Admission medications   Medication Sig Start Date End Date Taking? Authorizing Provider  amitriptyline (ELAVIL) 10 MG tablet Take 10 mg by mouth daily. 09/26/19  Yes [provider]  Cholecalciferol (VITAMIN D3 PO) Take 1 capsule by mouth daily.   Yes [provider]  naproxen (NAPROSYN) 500 MG tablet Take 500 mg by mouth 2 (two) times daily as needed for pain. 09/26/19  Yes [provider]  NORCO 10-325 MG tablet Take 1 tablet by mouth every 4 (four) hours as needed for pain. 10/06/19  Yes [provider]  polyvinyl alcohol (LIQUIFILM TEARS) 1.4 % ophthalmic solution Place 1 drop into both eyes as needed for dry eyes.   Yes [provider]  propranolol (INDERAL) 40 MG tablet Take 40 mg by mouth daily. 09/26/19  Yes [provider]  temazepam (RESTORIL) 15 MG capsule Take 15 mg by mouth at bedtime. 09/26/19  Yes [provider]  VITAMIN A PO Take 1 capsule by mouth daily.   Yes [provider]     Positive ROS: All other systems have been reviewed and were otherwise negative with  the exception of those mentioned in the HPI and as above.  Physical Exam: General: Alert, no acute distress Cardiovascular: No pedal edema Respiratory: No cyanosis, no use of accessory musculature GI: No organomegaly, abdomen is soft and non-tender Skin: No lesions in the area of chief complaint Neurologic: Sensation intact distally Psychiatric: Patient is competent for consent with normal mood and affect Lymphatic: No axillary or cervical lymphadenopathy  MUSCULOSKELETAL: left hip has groin pain and 0-80 degrees motion with severe pain.  EHL intact.    Assessment: Pathologic left hip fracture   Plan: Plan for Procedure(s): ARTHROPLASTY BIPOLAR HIP (HEMIARTHROPLASTY)  The risks benefits and alternatives were discussed with the patient including but not  limited to the risks of nonoperative treatment, versus surgical intervention including infection, bleeding, nerve injury,  blood clots, cardiopulmonary complications, morbidity, mortality, among others, and they were willing to proceed.   Patient has authorization for outpatient in bed, although she has multiple risk factors.  Will plan to admit post op and assess function to determine inpatient vs. Obs.  She will be obs for now.  Johnny Bridge, MD Cell [phone]   10/08/2019 2:09 PM

## 2019-10-08 NOTE — Transfer of Care (Signed)
Immediate Anesthesia Transfer of Care Note  Patient: Stephanie Duran  Procedure(s) Performed: Procedure(s): ARTHROPLASTY BIPOLAR HIP (HEMIARTHROPLASTY) (Left)  Patient Location: PACU  Anesthesia Type:Spinal  Level of Consciousness: awake, alert  and oriented  Airway & Oxygen Therapy: Patient Spontanous Breathing  Post-op Assessment: Report given to RN and Post -op Vital signs reviewed and stable  Post vital signs: Reviewed and stable  Last Vitals:  Vitals:   10/08/19 1152  BP: (!) 106/92  Pulse: 82  Resp: 18  Temp: 37 C  SpO2: 75%    Complications: No apparent anesthesia complications

## 2019-10-08 NOTE — Discharge Instructions (Signed)
INSTRUCTIONS AFTER JOINT REPLACEMENT   o Remove items at home which could result in a fall. This includes throw rugs or furniture in walking pathways o ICE to the affected joint every three hours while awake for 30 minutes at a time, for at least the first 3-5 days, and then as needed for pain and swelling.  Continue to use ice for pain and swelling. You may notice swelling that will progress down to the foot and ankle.  This is normal after surgery.  Elevate your leg when you are not up walking on it.   o Continue to use the breathing machine you got in the hospital (incentive spirometer) which will help keep your temperature down.  It is common for your temperature to cycle up and down following surgery, especially at night when you are not up moving around and exerting yourself.  The breathing machine keeps your lungs expanded and your temperature down.   DIET:  As you were doing prior to hospitalization, we recommend a well-balanced diet.  DRESSING / WOUND CARE / SHOWERING  You may change your dressing 3-5 days after surgery.  Then change the dressing every day with sterile gauze.  Please use good hand washing techniques before changing the dressing.  Do not use any lotions or creams on the incision until instructed by your surgeon.  ACTIVITY  o Increase activity slowly as tolerated, but follow the weight bearing instructions below.   o No driving for 6 weeks or until further direction given by your physician.  You cannot drive while taking narcotics.  o No lifting or carrying greater than 10 lbs. until further directed by your surgeon. o Avoid periods of inactivity such as sitting longer than an hour when not asleep. This helps prevent blood clots.  o You may return to work once you are authorized by your doctor.     WEIGHT BEARING   Weight bearing as tolerated with assist device (walker, cane, etc) as directed, use it as long as suggested by your surgeon or therapist, typically at  least 4-6 weeks.   EXERCISES  Results after joint replacement surgery are often greatly improved when you follow the exercise, range of motion and muscle strengthening exercises prescribed by your doctor. Safety measures are also important to protect the joint from further injury. Any time any of these exercises cause you to have increased pain or swelling, decrease what you are doing until you are comfortable again and then slowly increase them. If you have problems or questions, call your caregiver or physical therapist for advice.   Rehabilitation is important following a joint replacement. After just a few days of immobilization, the muscles of the leg can become weakened and shrink (atrophy).  These exercises are designed to build up the tone and strength of the thigh and leg muscles and to improve motion. Often times heat used for twenty to thirty minutes before working out will loosen up your tissues and help with improving the range of motion but do not use heat for the first two weeks following surgery (sometimes heat can increase post-operative swelling).   These exercises can be done on a training (exercise) mat, on the floor, on a table or on a bed. Use whatever works the best and is most comfortable for you.    Use music or television while you are exercising so that the exercises are a pleasant break in your day. This will make your life better with the exercises acting as a break  in your routine that you can look forward to.   Perform all exercises about fifteen times, three times per day or as directed.  You should exercise both the operative leg and the other leg as well.  Exercises include:   . Quad Sets - Tighten up the muscle on the front of the thigh (Quad) and hold for 5-10 seconds.   . Straight Leg Raises - With your knee straight (if you were given a brace, keep it on), lift the leg to 60 degrees, hold for 3 seconds, and slowly lower the leg.  Perform this exercise against  resistance later as your leg gets stronger.  . Leg Slides: Lying on your back, slowly slide your foot toward your buttocks, bending your knee up off the floor (only go as far as is comfortable). Then slowly slide your foot back down until your leg is flat on the floor again.  [place] Haring Wings: Lying on your back spread your legs to the side as far apart as you can without causing discomfort.  . Hamstring Strength:  Lying on your back, push your heel against the floor with your leg straight by tightening up the muscles of your buttocks.  Repeat, but this time bend your knee to a comfortable angle, and push your heel against the floor.  You may put a pillow under the heel to make it more comfortable if necessary.   A rehabilitation program following joint replacement surgery can speed recovery and prevent re-injury in the future due to weakened muscles. Contact your doctor or a physical therapist for more information on knee rehabilitation.    CONSTIPATION  Constipation is defined medically as fewer than three stools per week and severe constipation as less than one stool per week.  Even if you have a regular bowel pattern at home, your normal regimen is likely to be disrupted due to multiple reasons following surgery.  Combination of anesthesia, postoperative narcotics, change in appetite and fluid intake all can affect your bowels.   YOU MUST use at least one of the following options; they are listed in order of increasing strength to get the job done.  They are all available over the counter, and you may need to use some, POSSIBLY even all of these options:    Drink plenty of fluids (prune juice may be helpful) and high fiber foods Colace 100 mg by mouth twice a day  Senokot for constipation as directed and as needed Dulcolax (bisacodyl), take with full glass of water  Miralax (polyethylene glycol) once or twice a day as needed.  If you have tried all these things and are unable to have a bowel  movement in the first 3-4 days after surgery call either your surgeon or your primary doctor.    If you experience loose stools or diarrhea, hold the medications until you stool forms back up.  If your symptoms do not get better within 1 week or if they get worse, check with your doctor.  If you experience "the worst abdominal pain ever" or develop nausea or vomiting, please contact the office immediately for further recommendations for treatment.   ITCHING:  If you experience itching with your medications, try taking only a single pain pill, or even half a pain pill at a time.  You can also use Benadryl over the counter for itching or also to help with sleep.   TED HOSE STOCKINGS:  Use stockings on both legs until for at least 2 weeks or as  directed by physician office. They may be removed at night for sleeping.  MEDICATIONS:  See your medication summary on the "After Visit Summary" that nursing will review with you.  You may have some home medications which will be placed on hold until you complete the course of blood thinner medication.  It is important for you to complete the blood thinner medication as prescribed.  PRECAUTIONS:  If you experience chest pain or shortness of breath - call 911 immediately for transfer to the hospital emergency department.   If you develop a fever greater that 101 F, purulent drainage from wound, increased redness or drainage from wound, foul odor from the wound/dressing, or calf pain - CONTACT YOUR SURGEON.                                                   FOLLOW-UP APPOINTMENTS:  If you do not already have a post-op appointment, please call the office for an appointment to be seen by your surgeon.  Guidelines for how soon to be seen are listed in your "After Visit Summary", but are typically between 1-4 weeks after surgery.  OTHER INSTRUCTIONS:   Knee Replacement:  Do not place pillow under knee, focus on keeping the knee straight while resting. CPM  instructions: 0-90 degrees, 2 hours in the morning, 2 hours in the afternoon, and 2 hours in the evening. Place foam block, curve side up under heel at all times except when in CPM or when walking.  DO NOT modify, tear, cut, or change the foam block in any way.   DENTAL ANTIBIOTICS:  In most cases prophylactic antibiotics for Dental procdeures after total joint surgery are not necessary.  Exceptions are as follows:  1. History of prior total joint infection  2. Severely immunocompromised (Organ Transplant, cancer chemotherapy, Rheumatoid biologic meds such as [place])  3. Poorly controlled diabetes (A1C &gt; 8.0, blood glucose over 200)  If you have one of these conditions, contact your surgeon for an antibiotic prescription, prior to your dental procedure.   MAKE SURE YOU:  . Understand these instructions.  . Get help right away if you are not doing well or get worse.    Thank you for letting us be a part of your medical care team.  It is a privilege we respect greatly.  We hope these instructions will help you stay on track for a fast and full recovery!

## 2019-10-08 NOTE — Anesthesia Postprocedure Evaluation (Signed)
Anesthesia Post Note  Patient: Stephanie Duran  Procedure(s) Performed: ARTHROPLASTY BIPOLAR HIP (HEMIARTHROPLASTY) (Left Hip)     Patient location during evaluation: PACU Anesthesia Type: Spinal Level of consciousness: awake Pain management: pain level controlled Vital Signs Assessment: post-procedure vital signs reviewed and stable Respiratory status: spontaneous breathing Cardiovascular status: stable Postop Assessment: no apparent nausea or vomiting Anesthetic complications: no    Last Vitals:  Vitals:   10/08/19 1757 10/08/19 1800  BP: (!) 168/82   Pulse: 82 82  Resp: (!) 21 15  Temp:    SpO2: 100% 100%    Last Pain:  Vitals:   10/08/19 1745  TempSrc:   PainSc: 0-No pain                 Anner Baity

## 2019-10-08 NOTE — Progress Notes (Signed)
Pt & pts daughter asked about pt getting a nicotine patch for while she is in the hospital. Contacted the PA on call [facility]. They said they don't do nicotine patches do to it slowing the bone growth/ healing. Notified patient of this.

## 2019-10-08 NOTE — Anesthesia Preprocedure Evaluation (Addendum)
Anesthesia Evaluation  Patient identified by MRN, date of birth, ID band Patient awake    Reviewed: Allergy & Precautions, NPO status , Patient's Chart, lab work & pertinent test results, reviewed documented beta blocker date and time   Airway Mallampati: I  TM Distance: >3 FB Neck ROM: Full    Dental no notable dental hx. (+) Dental Advisory Given, Edentulous Upper, Poor Dentition,    Pulmonary COPD, Current Smoker,  Per daughter, was prescribed an inhaler at a recent office visit but refused to pick up from pharmacy  Has been smoking since 86yo, about 1/2 ppd currently    + decreased breath sounds+ wheezing      Cardiovascular hypertension, Pt. on medications and Pt. on home beta blockers Normal cardiovascular exam Rhythm:Regular Rate:Normal     Neuro/Psych negative neurological ROS  negative psych ROS   GI/Hepatic negative GI ROS, Neg liver ROS,   Endo/Other  negative endocrine ROS  Renal/GU negative Renal ROS  negative genitourinary   Musculoskeletal  (+) Arthritis , Osteoarthritis,  Cracked femoral head/impending fracture    Abdominal Normal abdominal exam  (+)   Peds negative pediatric ROS (+)  Hematology negative hematology ROS (+) plt 329, hct 35.8   Anesthesia Other Findings   Reproductive/Obstetrics negative OB ROS                          Anesthesia Physical Anesthesia Plan  ASA: III  Anesthesia Plan: Spinal and MAC   Post-op Pain Management:    Induction: Intravenous  PONV Risk Score and Plan: 2 and Propofol infusion, TIVA and Treatment may vary due to age or medical condition  Airway Management Planned: Natural Airway and Nasal Cannula  Additional Equipment: None  Intra-op Plan:   Post-operative Plan:   Informed Consent: I have reviewed the patients History and Physical, chart, labs and discussed the procedure including the risks, benefits and alternatives for the  proposed anesthesia with the patient or authorized representative who has indicated his/her understanding and acceptance.       Plan Discussed with: CRNA  Anesthesia Plan Comments: (Pt is very poor historian, called Daughter Tye [place] who is an Therapist, sports and spoke with her. Per daughter, pt has never had stroke or MI; is otherwise healthy aside from HTN and smoking history. Per daughter, pt non-compliant with inhaler that was prescribed for her COPD. )      Anesthesia Quick Evaluation

## 2019-10-09 ENCOUNTER — Encounter: Payer: Self-pay | Admitting: *Deleted

## 2019-10-09 LAB — BASIC METABOLIC PANEL
Anion gap: 8 (ref 5–15)
BUN: 28 mg/dL — ABNORMAL HIGH (ref 8–23)
CO2: 26 mmol/L (ref 22–32)
Calcium: 9.4 mg/dL (ref 8.9–10.3)
Chloride: 104 mmol/L (ref 98–111)
Creatinine, Ser: 0.86 mg/dL (ref 0.44–1.00)
GFR calc Af Amer: >60 mL/min (ref >60)
GFR calc non Af Amer: >60 mL/min (ref >60)
Glucose, Bld: 150 mg/dL — ABNORMAL HIGH (ref 70–99)
Potassium: 4.2 mmol/L (ref 3.5–5.1)
Sodium: 138 mmol/L (ref 135–145)

## 2019-10-09 LAB — CBC
HCT: 27.3 % — ABNORMAL LOW (ref 36.0–46.0)
Hemoglobin: 9.2 g/dL — ABNORMAL LOW (ref 12.0–15.0)
MCH: 31.4 pg (ref 26.0–34.0)
MCHC: 33.7 g/dL (ref 30.0–36.0)
MCV: 93.2 fL (ref 80.0–100.0)
Platelets: 243 10*3/uL (ref 150–400)
RBC: 2.93 MIL/uL — ABNORMAL LOW (ref 3.87–5.11)
RDW: 12 % (ref 11.5–15.5)
WBC: 14.6 10*3/uL — ABNORMAL HIGH (ref 4.0–10.5)
nRBC: 0 % (ref 0.0–0.2)

## 2019-10-09 MED ORDER — NICOTINE 14 MG/24HR TD PT24
14.0000 mg | MEDICATED_PATCH | Freq: Every day | TRANSDERMAL | Status: DC
  Administered 2019-10-10: 14 mg via TRANSDERMAL
  Filled 2019-10-09: qty 1

## 2019-10-09 MED ORDER — NICOTINE 7 MG/24HR TD PT24
7.0000 mg | MEDICATED_PATCH | Freq: Every day | TRANSDERMAL | Status: DC
  Administered 2019-10-09: 7 mg via TRANSDERMAL
  Filled 2019-10-09: qty 1

## 2019-10-09 NOTE — Progress Notes (Signed)
Physical Therapy Treatment Patient Details Name: Stephanie Duran MRN: [id number] DOB: 13-Jul-1931 Today's Date: 10/09/2019    History of Present Illness Pt is an 86 year old female s/p left hip hemiarthroplasty due to pathological fracture.    PT Comments    Pt educated on LE exercises.  Also reviewed posterior hip precautions as pt unable to recall and continues to require cues to maintain.  Pt appears more confused this afternoon and seems slower to process, however also very HOH.  Pt ambulated in hallway and assisted back to bed.  Pt will need to practice steps prior to d/c.    Follow Up Recommendations  Home health PT;Supervision for mobility/OOB     Equipment Recommendations  None recommended by PT    Recommendations for Other Services       Precautions / Restrictions Precautions Precautions: Fall;Posterior Hip Precaution Comments: pt did not recall precautions, reviewed with handout Restrictions Weight Bearing Restrictions: No    Mobility  Bed Mobility Overal bed mobility: Needs Assistance Bed Mobility: Sit to Supine       Sit to supine: Min guard   General bed mobility comments: pt able to self assist LEs onto bed, min/guard for ensuring precautions  Transfers Overall transfer level: Needs assistance Equipment used: Rolling walker (2 wheeled) Transfers: Sit to/from Stand Sit to Stand: Min assist         General transfer comment: verbal cues for UE and LE positioning, maintaining posterior hip precautions, assist for rise and steady  Ambulation/Gait Ambulation/Gait assistance: Min guard;Min assist Gait Distance (Feet): 120 Feet Assistive device: Rolling walker (2 wheeled) Gait Pattern/deviations: Step-through pattern;Decreased stride length;Antalgic     General Gait Details: verbal cues for sequencing, RW positioning, maintaining hip precautions; more assist initially for cues and maneuvering RW   Stairs             Wheelchair Mobility     Modified Rankin (Stroke Patients Only)       Balance                                            Cognition Arousal/Alertness: Awake/alert Behavior During Therapy: WFL for tasks assessed/performed Overall Cognitive Status: Impaired/Different from baseline Area of Impairment: Following commands;Problem solving                       Following Commands: Follows one step commands with increased time     Problem Solving: Slow processing;Requires verbal cues General Comments: pt appears more confused this afternoon, RN reports cognition variable today      Exercises Total Joint Exercises Ankle Circles/Pumps: AROM;Both;10 reps Quad Sets: AROM;10 reps;Both Heel Slides: AAROM;Left;10 reps;Other (comment)(all exercises within precautions) Hip ABduction/ADduction: AROM;Left;10 reps    General Comments        Pertinent Vitals/Pain Pain Assessment: 0-10 Pain Score: 2  Pain Location: left hip Pain Descriptors / Indicators: Aching;Sore Pain Intervention(s): Monitored during session;Repositioned    Home Living Family/patient expects to be discharged to:: Private residence Living Arrangements: Alone Available Help at Discharge: Family;Available 24 hours/day(family plans to have 24/7 care upon d/c) Type of Home: House Home Access: Stairs to enter Entrance Stairs-Rails: Can reach both Home Layout: One level Home Equipment: Walker - 2 wheels;Bedside commode      Prior Function Level of Independence: Independent          PT Goals (current  goals can now be found in the care plan section) Acute Rehab PT Goals PT Goal Formulation: With patient/family Time For Goal Achievement: 10/15/19 Potential to Achieve Goals: Good Progress towards PT goals: Progressing toward goals    Frequency    7X/week      PT Plan Current plan remains appropriate    Co-evaluation              AM-PAC PT "6 Clicks" Mobility   Outcome Measure  Help needed  turning from your back to your side while in a flat bed without using bedrails?: A Little Help needed moving from lying on your back to sitting on the side of a flat bed without using bedrails?: A Little Help needed moving to and from a bed to a chair (including a wheelchair)?: A Little Help needed standing up from a chair using your arms (e.g., wheelchair or bedside chair)?: A Little Help needed to walk in hospital room?: A Little Help needed climbing 3-5 steps with a railing? : A Lot 6 Click Score: 17    End of Session Equipment Utilized During Treatment: Gait belt Activity Tolerance: Patient tolerated treatment well Patient left: in bed;with call bell/phone within reach;with bed alarm set Nurse Communication: Mobility status PT Visit Diagnosis: Other abnormalities of gait and mobility (R26.89)     Time: 9826-4158 PT Time Calculation (min) (ACUTE ONLY): 31 min  Charges:  $Gait Training: 8-22 mins $Therapeutic Exercise: 8-22 mins                    Arlyce Dice, DPT Acute Rehabilitation Services Office: [phone]  York Ram E 10/09/2019, 3:50 PM

## 2019-10-09 NOTE — Evaluation (Signed)
Physical Therapy Evaluation Patient Details Name: Stephanie Duran MRN: [id number] DOB: 06/24/1932 Today's Date: 10/09/2019   History of Present Illness  Pt is an 86 year old female s/p left hip hemiarthroplasty due to pathological fracture.  Clinical Impression  Pt admitted with above diagnosis. Pt currently with functional limitations due to the deficits listed below (see PT Problem List). Pt will benefit from skilled PT to increase their independence and safety with mobility to allow discharge to the venue listed below.  Pt assisted with ambulating and educated on maintaining posterior hip precautions.  Daughter present for evaluation and states she plans to have 24/7 assist at home upon d/c and requesting HHPT.  Pt was living alone and very independent prior to onset of left hip pain (family has been helping the past 2 weeks prior to surgery).     Follow Up Recommendations Home health PT;Supervision for mobility/OOB    Equipment Recommendations  None recommended by PT    Recommendations for Other Services       Precautions / Restrictions Precautions Precautions: Fall;Posterior Hip Precaution Comments: educated pt and daughter on posterior hip precautions, left handout Restrictions Weight Bearing Restrictions: No      Mobility  Bed Mobility               General bed mobility comments: pt sitting EOB on arrival  Transfers Overall transfer level: Needs assistance Equipment used: Rolling walker (2 wheeled) Transfers: Sit to/from Stand Sit to Stand: Min guard         General transfer comment: verbal cues for UE and LE positioning, maintaining posterior hip precautions  Ambulation/Gait Ambulation/Gait assistance: Min guard Gait Distance (Feet): 120 Feet Assistive device: Rolling walker (2 wheeled) Gait Pattern/deviations: Step-through pattern;Decreased stride length;Antalgic     General Gait Details: verbal cues for sequencing, RW positioning, maintaining hip  precautions  Stairs            Wheelchair Mobility    Modified Rankin (Stroke Patients Only)       Balance                                             Pertinent Vitals/Pain Pain Assessment: 0-10 Pain Score: 3  Pain Location: left hip Pain Descriptors / Indicators: Aching;Sore Pain Intervention(s): Repositioned;Monitored during session;Ice applied    Home Living Family/patient expects to be discharged to:: Private residence Living Arrangements: Alone Available Help at Discharge: Family;Available 24 hours/day(family plans to have 24/7 care upon d/c) Type of Home: House Home Access: Stairs to enter Entrance Stairs-Rails: Can reach both Entrance Stairs-Number of Steps: 3 Home Layout: One level Home Equipment: Walker - 2 wheels;Bedside commode      Prior Function Level of Independence: Independent               Hand Dominance        Extremity/Trunk Assessment        Lower Extremity Assessment Lower Extremity Assessment: LLE deficits/detail;Generalized weakness LLE Deficits / Details: maintained hip precautions, hip grossly at least 2+/5 per observation       Communication   Communication: HOH  Cognition Arousal/Alertness: Awake/alert Behavior During Therapy: WFL for tasks assessed/performed Overall Cognitive Status: Within Functional Limits for tasks assessed  General Comments      Exercises     Assessment/Plan    PT Assessment Patient needs continued PT services  PT Problem List Decreased strength;Decreased range of motion;Decreased mobility;Decreased knowledge of precautions;Decreased knowledge of use of DME;Decreased balance       PT Treatment Interventions DME instruction;Therapeutic activities;Gait training;Therapeutic exercise;Patient/family education;Stair training;Functional mobility training    PT Goals (Current goals can be found in the Care Plan section)   Acute Rehab PT Goals PT Goal Formulation: With patient/family Time For Goal Achievement: 10/15/19 Potential to Achieve Goals: Good    Frequency 7X/week   Barriers to discharge        Co-evaluation               AM-PAC PT "6 Clicks" Mobility  Outcome Measure Help needed turning from your back to your side while in a flat bed without using bedrails?: A Little Help needed moving from lying on your back to sitting on the side of a flat bed without using bedrails?: A Little Help needed moving to and from a bed to a chair (including a wheelchair)?: A Little Help needed standing up from a chair using your arms (e.g., wheelchair or bedside chair)?: A Little Help needed to walk in hospital room?: A Little Help needed climbing 3-5 steps with a railing? : A Lot 6 Click Score: 17    End of Session Equipment Utilized During Treatment: Gait belt Activity Tolerance: Patient tolerated treatment well Patient left: in chair;with chair alarm set;with call bell/phone within reach;with family/visitor present Nurse Communication: Mobility status PT Visit Diagnosis: Other abnormalities of gait and mobility (R26.89)    Time: 4132-4401 PT Time Calculation (min) (ACUTE ONLY): 28 min   Charges:   PT Evaluation $PT Eval Low Complexity: 1 Low PT Treatments $Gait Training: 8-22 mins   Arlyce Dice, DPT Acute Rehabilitation Services Office: [phone]  Trena Platt 10/09/2019, 12:55 PM

## 2019-10-09 NOTE — TOC Initial Note (Signed)
Transition of Care [facility]) - Initial/Assessment Note    Patient Details  Name: Stephanie Duran MRN: [id number] Date of Birth: [birth year]  Transition of Care [facility]) CM/SW Contact:    Lennart Pall, LCSW Phone Number: 10/09/2019, 12:00 PM  Clinical Narrative:   Met with pt, daughter and daughter-in-law to review potential d/c needs.  Pt already has access to rw, commode and wheelchair if needed.  Has not had any HH in the past and no preference of agency.  Family very involved and supportive.  Daughter plans to arrange private duty caregiver to ensure that 24/7 coverage is in place for d/c support.  Will follow along for any needed referrals.            Expected Discharge Plan: [place] Barriers to Discharge: Continued Medical Work up   Patient Goals and CMS Choice Patient states their goals for this hospitalization and ongoing recovery are:: "to go home!" CMS Medicare.gov Compare Post Acute Care list provided to:: Patient Represenative (must comment)(daughter) Choice offered to / list presented to : Adult Children  Expected Discharge Plan and Services Expected Discharge Plan: [place] In-house Referral: Clinical Social Work     Living arrangements for the past 2 months: Single Family Home                                      Prior Living Arrangements/Services Living arrangements for the past 2 months: Single Family Home Lives with:: Self Patient language and need for interpreter reviewed:: Yes Do you feel safe going back to the place where you live?: Yes      Need for Family Participation in Patient Care: Yes (Comment) Care giver support system in place?: Yes (comment)   Criminal Activity/Legal Involvement Pertinent to Current Situation/Hospitalization: No - Comment as needed  Activities of Daily Living Home Assistive Devices/Equipment: Walker (specify type)(front wheel) ADL Screening (condition at time of admission) Patient's  cognitive ability adequate to safely complete daily activities?: Yes Is the patient deaf or have difficulty hearing?: Yes Does the patient have difficulty seeing, even when wearing glasses/contacts?: No Does the patient have difficulty concentrating, remembering, or making decisions?: No Patient able to express need for assistance with ADLs?: Yes Does the patient have difficulty dressing or bathing?: No Independently performs ADLs?: Yes (appropriate for developmental age) Does the patient have difficulty walking or climbing stairs?: Yes Weakness of Legs: Left Weakness of Arms/Hands: None  Permission Sought/Granted Permission sought to share information with : Case Manager, Family Supports Permission granted to share information with : Yes, Verbal Permission Granted  Share Information with NAME: Harlene Salts     Permission granted to share info w Relationship: daughter  Permission granted to share info w Contact Information: [phone]  Emotional Assessment Appearance:: Appears older than stated age Attitude/Demeanor/Rapport: Engaged, Gracious Affect (typically observed): Pleasant Orientation: : Oriented to Self, Oriented to Place, Oriented to  Time, Oriented to Situation Alcohol / Substance Use: Not Applicable Psych Involvement: No (comment)  Admission diagnosis:  Pathological fracture, left femur, initial encounter for fracture [facility]) [V76.160V] Patient Active Problem List   Diagnosis Date Noted  . Pathological fracture, left femur, initial encounter for fracture ([place]) 10/08/2019   PCP:  Cyndi Bender, PA-C Pharmacy:   [id number] [id number]: [phone]: [phone]  Social Determinants of Health (SDOH) Interventions    Readmission Risk Interventions Readmission Risk Prevention Plan 10/09/2019  Post Dischage Appt Complete  Medication Screening Complete   Transportation Screening Complete  Some recent data might be hidden

## 2019-10-09 NOTE — Progress Notes (Addendum)
Subjective: 1 Day Post-Op s/p Procedure(s): ARTHROPLASTY BIPOLAR HIP (HEMIARTHROPLASTY)   Patient is alert, oriented, laying comfortably in bed. Patient is hard of healing but was able to communicate with her. States she has moderate pain in left hip. Denies chest pain, SOB, calf pain, nausea or vomiting.   Objective:  PE: VITALS:   Vitals:   10/08/19 2018 10/08/19 2202 10/09/19 0206 10/09/19 0530  BP: (!) 156/68 (!) 160/66 (!) 164/78 (!) 167/88  Pulse: 95 80 77 84  Resp: 16 18 18 18   Temp: 98.3 F (36.8 C) 98.7 F (37.1 C) 98.5 F (36.9 C) 98.1 F (36.7 C)  TempSrc: Oral Oral Oral Oral  SpO2: 100% 100% 99% 100%  Weight:      Height:        ABD soft Neurovascular intact Sensation intact distally Intact pulses distally Dorsiflexion/Plantar flexion intact Incision: dressing C/D/I and scant drainage  LABS  Results for orders placed or performed during the hospital encounter of 10/08/19 (from the past 24 hour(s))  Comprehensive metabolic panel     Status: Abnormal   Collection Time: 10/08/19  1:02 PM  Result Value Ref Range   Sodium 138 135 - 145 mmol/L   Potassium 4.1 3.5 - 5.1 mmol/L   Chloride 102 98 - 111 mmol/L   CO2 29 22 - 32 mmol/L   Glucose, Bld 113 (H) 70 - 99 mg/dL   BUN 25 (H) 8 - 23 mg/dL   Creatinine, Ser 0.82 0.44 - 1.00 mg/dL   Calcium 9.9 8.9 - 10.3 mg/dL   Total Protein 7.3 6.5 - 8.1 g/dL   Albumin 3.7 3.5 - 5.0 g/dL   AST 14 (L) 15 - 41 U/L   ALT 11 0 - 44 U/L   Alkaline Phosphatase 83 38 - 126 U/L   Total Bilirubin 1.0 0.3 - 1.2 mg/dL   GFR calc non Af Amer >60 >60 mL/min   GFR calc Af Amer >60 >60 mL/min   Anion gap 7 5 - 15  Type and screen All cardiac and thoracic surgeries, spinal fusions, myomectomies, craniotomies, colon & liver resections, total joint revisions, same day c-section with placenta previa or accreta     Status: None   Collection Time: 10/08/19  1:02 PM  Result Value Ref Range   ABO/RH(D) O POS    Antibody Screen  NEG    Sample Expiration      10/11/2019,2359 Performed at [facility], [place] [address], [place], [place] [address]   Protime-INR     Status: None   Collection Time: 10/08/19  1:02 PM  Result Value Ref Range   Prothrombin Time 12.6 11.4 - 15.2 seconds   INR 1.0 0.8 - 1.2  CBC     Status: Abnormal   Collection Time: 10/08/19  1:02 PM  Result Value Ref Range   WBC 10.0 4.0 - 10.5 K/uL   RBC 3.81 (L) 3.87 - 5.11 MIL/uL   Hemoglobin 12.0 12.0 - 15.0 g/dL   HCT 35.8 (L) 36.0 - 46.0 %   MCV 94.0 80.0 - 100.0 fL   MCH 31.5 26.0 - 34.0 pg   MCHC 33.5 30.0 - 36.0 g/dL   RDW 12.4 11.5 - 15.5 %   Platelets 329 150 - 400 K/uL   nRBC 0.0 0.0 - 0.2 %  ABO/Rh     Status: None   Collection Time: 10/08/19  1:02 PM  Result Value Ref Range   ABO/RH(D)  O POS Performed at [facility], [place] [address] [place], [place] [address]   CBC     Status: Abnormal   Collection Time: 10/09/19  3:55 AM  Result Value Ref Range   WBC 14.6 (H) 4.0 - 10.5 K/uL   RBC 2.93 (L) 3.87 - 5.11 MIL/uL   Hemoglobin 9.2 (L) 12.0 - 15.0 g/dL   HCT 27.3 (L) 36.0 - 46.0 %   MCV 93.2 80.0 - 100.0 fL   MCH 31.4 26.0 - 34.0 pg   MCHC 33.7 30.0 - 36.0 g/dL   RDW 12.0 11.5 - 15.5 %   Platelets 243 150 - 400 K/uL   nRBC 0.0 0.0 - 0.2 %  Basic metabolic panel     Status: Abnormal   Collection Time: 10/09/19  3:55 AM  Result Value Ref Range   Sodium 138 135 - 145 mmol/L   Potassium 4.2 3.5 - 5.1 mmol/L   Chloride 104 98 - 111 mmol/L   CO2 26 22 - 32 mmol/L   Glucose, Bld 150 (H) 70 - 99 mg/dL   BUN 28 (H) 8 - 23 mg/dL   Creatinine, Ser 0.86 0.44 - 1.00 mg/dL   Calcium 9.4 8.9 - 10.3 mg/dL   GFR calc non Af Amer >60 >60 mL/min   GFR calc Af Amer >60 >60 mL/min   Anion gap 8 5 - 15    Pelvis Portable  Result Date: 10/08/2019 CLINICAL DATA:  Status post total hip arthroplasty on the left. EXAM: PORTABLE PELVIS 1-2 VIEWS COMPARISON:  September 26, 2019 FINDINGS: The patient is  status post total hip arthroplasty on the left. The hardware appears intact. There is no periprosthetic fracture. There are expected postsurgical changes including subcutaneous gas and overlying soft tissue edema. IMPRESSION: Status post left total hip arthroplasty with expected postsurgical changes. Electronically Signed   By: Constance Holster M.D.   On: 10/08/2019 17:37   DG Hip Port Unilat With Pelvis 1V Left  Result Date: 10/08/2019 CLINICAL DATA:  Status post total hip arthroplasty on the left. EXAM: DG HIP (WITH OR WITHOUT PELVIS) 1V PORT LEFT COMPARISON:  September 26, 2019 FINDINGS: The patient is status post total hip arthroplasty on the left. The hardware appears intact. There is no periprosthetic fracture. There are expected postsurgical changes including subcutaneous gas and overlying soft tissue edema. IMPRESSION: Status post left total hip arthroplasty with expected postsurgical changes. Electronically Signed   By: Constance Holster M.D.   On: 10/08/2019 17:37    Assessment/Plan: Principal Problem:   Pathological fracture, left femur, initial encounter for fracture ([place])   1 Day Post-Op s/p Procedure(s): ARTHROPLASTY BIPOLAR HIP (HEMIARTHROPLASTY)  Weightbearing: WBAT LLE Insicional and dressing care: PRN dressing changes VTE prophylaxis: Lovenox, SCD's Pain control: continue current regimen Follow - up plan: Follow-up with Dr. Mardelle Matte 2 weeks after discharge Dispo: pending PT eval, TOC consult placed. Femoral head sent for pathology, no results yet. Patient will need outpatient oncology work up when discharged. I have contacted her PCP to let them know.    Contact information:   Weekdays 8-5 Merlene Pulling, [place] [phone] A fter hours and holidays please check [email or web address] for group call information for [facility]  Ventura Bruns 10/09/2019, 7:25 AM

## 2019-10-10 LAB — CBC
HCT: 26.5 % — ABNORMAL LOW (ref 36.0–46.0)
Hemoglobin: 8.9 g/dL — ABNORMAL LOW (ref 12.0–15.0)
MCH: 31.9 pg (ref 26.0–34.0)
MCHC: 33.6 g/dL (ref 30.0–36.0)
MCV: 95 fL (ref 80.0–100.0)
Platelets: 271 10*3/uL (ref 150–400)
RBC: 2.79 MIL/uL — ABNORMAL LOW (ref 3.87–5.11)
RDW: 12.3 % (ref 11.5–15.5)
WBC: 13.6 10*3/uL — ABNORMAL HIGH (ref 4.0–10.5)
nRBC: 0 % (ref 0.0–0.2)

## 2019-10-10 LAB — BASIC METABOLIC PANEL
Anion gap: 9 (ref 5–15)
BUN: 23 mg/dL (ref 8–23)
CO2: 26 mmol/L (ref 22–32)
Calcium: 9.7 mg/dL (ref 8.9–10.3)
Chloride: 106 mmol/L (ref 98–111)
Creatinine, Ser: 0.9 mg/dL (ref 0.44–1.00)
GFR calc Af Amer: >60 mL/min (ref >60)
GFR calc non Af Amer: 58 mL/min — ABNORMAL LOW (ref >60)
Glucose, Bld: 123 mg/dL — ABNORMAL HIGH (ref 70–99)
Potassium: 4.1 mmol/L (ref 3.5–5.1)
Sodium: 141 mmol/L (ref 135–145)

## 2019-10-10 MED ORDER — HYDROCODONE-ACETAMINOPHEN 10-325 MG PO TABS: 1.0000 | ORAL_TABLET | Freq: Four times a day (QID) | ORAL | 0 refills | Status: DC | PRN

## 2019-10-10 MED ORDER — ENOXAPARIN SODIUM 40 MG/0.4ML ~~LOC~~ SOLN: 40.0000 mg | SUBCUTANEOUS | 0 refills | Status: DC

## 2019-10-10 NOTE — Discharge Summary (Signed)
Discharge Summary  Patient ID: Stephanie Duran MRN: [id number] DOB/AGE: Nov 18, 1931 86 y.o.  Admit date: 10/08/2019 Discharge date: 10/10/2019  Admission Diagnoses:  Pathological fracture, left femur, initial encounter for fracture [facility])  Discharge Diagnoses:  Principal Problem:   Pathological fracture, left femur, initial encounter for fracture [facility])   Past Medical History:  Diagnosis Date  . Borderline systolic HTN   . Hypertension   . Pneumonia     Surgeries: Procedure(s): ARTHROPLASTY BIPOLAR HIP (HEMIARTHROPLASTY) on 10/08/2019   Consultants (if any):   Discharged Condition: Improved  Hospital Course: Stephanie Duran is an 86 y.o. female who was admitted 10/08/2019 with a diagnosis of Pathological fracture, left femur, initial encounter for fracture [facility]) and went to the operating room on 10/08/2019 and underwent the above named procedures.    She was given perioperative antibiotics:  Anti-infectives (From admission, onward)   Start     Dose/Rate Route Frequency Ordered Stop   10/08/19 2000  ceFAZolin (ANCEF) IVPB 2g/100 mL premix     2 g 200 mL/hr over 30 Minutes Intravenous Every 6 hours 10/08/19 1819 10/09/19 0135   10/08/19 1215  ceFAZolin (ANCEF) IVPB 2g/100 mL premix     2 g 200 mL/hr over 30 Minutes Intravenous On call to O.R. 10/08/19 1202 10/08/19 1417    .  She was given sequential compression devices, early ambulation, and lovenox for DVT prophylaxis.  She benefited maximally from the hospital stay and there were no complications.    Recent vital signs:  Vitals:   10/09/19 2110 10/10/19 0558  BP: 135/73 (!) 168/75  Pulse: 76 (!) 104  Resp: 20 18  Temp: 97.9 F (36.6 C) 98.7 F (37.1 C)  SpO2: 96% 96%    Recent laboratory studies:  Lab Results  Component Value Date   HGB 8.9 (L) 10/10/2019   HGB 9.2 (L) 10/09/2019   HGB 12.0 10/08/2019   Lab Results  Component Value Date   WBC 13.6 (H) 10/10/2019   PLT 271 10/10/2019   Lab Results  Component  Value Date   INR 1.0 10/08/2019   Lab Results  Component Value Date   NA 141 10/10/2019   K 4.1 10/10/2019   CL 106 10/10/2019   CO2 26 10/10/2019   BUN 23 10/10/2019   CREATININE 0.90 10/10/2019   GLUCOSE 123 (H) 10/10/2019    Discharge Medications:   Allergies as of 10/10/2019      Reactions   Valium [diazepam]    Anxious/ " makes me climb walls"      Medication List    STOP taking these medications   naproxen 500 MG tablet Commonly known as: NAPROSYN     TAKE these medications   amitriptyline 10 MG tablet Commonly known as: ELAVIL Take 10 mg by mouth daily.   enoxaparin 40 MG/0.4ML injection Commonly known as: Lovenox Inject 0.4 mLs (40 mg total) into the skin daily.   HYDROcodone-acetaminophen 10-325 MG tablet Commonly known as: Norco Take 1 tablet by mouth every 6 (six) hours as needed. What changed:   when to take this  reasons to take this   ondansetron 4 MG tablet Commonly known as: Zofran Take 1 tablet (4 mg total) by mouth every 8 (eight) hours as needed for nausea or vomiting.   polyvinyl alcohol 1.4 % ophthalmic solution Commonly known as: LIQUIFILM TEARS Place 1 drop into both eyes as needed for dry eyes.   propranolol 40 MG tablet Commonly known as: INDERAL Take 40 mg by mouth  daily.   sennosides-docusate sodium 8.6-50 MG tablet Commonly known as: SENOKOT-S Take 2 tablets by mouth daily.   temazepam 15 MG capsule Commonly known as: RESTORIL Take 15 mg by mouth at bedtime.   VITAMIN A PO Take 1 capsule by mouth daily.   VITAMIN D3 PO Take 1 capsule by mouth daily.       Diagnostic Studies: Pelvis Portable  Result Date: 10/08/2019 CLINICAL DATA:  Status post total hip arthroplasty on the left. EXAM: PORTABLE PELVIS 1-2 VIEWS COMPARISON:  September 26, 2019 FINDINGS: The patient is status post total hip arthroplasty on the left. The hardware appears intact. There is no periprosthetic fracture. There are expected postsurgical changes  including subcutaneous gas and overlying soft tissue edema. IMPRESSION: Status post left total hip arthroplasty with expected postsurgical changes. Electronically Signed   By: Constance Holster M.D.   On: 10/08/2019 17:37   DG Hip Port Unilat With Pelvis 1V Left  Result Date: 10/08/2019 CLINICAL DATA:  Status post total hip arthroplasty on the left. EXAM: DG HIP (WITH OR WITHOUT PELVIS) 1V PORT LEFT COMPARISON:  September 26, 2019 FINDINGS: The patient is status post total hip arthroplasty on the left. The hardware appears intact. There is no periprosthetic fracture. There are expected postsurgical changes including subcutaneous gas and overlying soft tissue edema. IMPRESSION: Status post left total hip arthroplasty with expected postsurgical changes. Electronically Signed   By: Constance Holster M.D.   On: 10/08/2019 17:37    Disposition: Discharge disposition: 01-Home or Self Care         Follow-up Information    Marchia Bond, MD. Schedule an appointment as soon as possible for a visit in 2 weeks.   Specialty: Orthopedic Surgery Contact information: [address] [place] [address] [phone]            Signed: Jola Baptist 10/10/2019, 9:34 AM

## 2019-10-10 NOTE — Progress Notes (Signed)
Physical Therapy Treatment Patient Details Name: Stephanie Duran MRN: [id number] DOB: [birth year] Today's Date: 10/10/2019    History of Present Illness Pt is an 86 year old female s/p left hip hemiarthroplasty due to pathological fracture.    PT Comments    Pt assisted with ambulating and practicing safe stair technique.  [name] present and observed today.  Answered [name]'s questions within scope of practice and she feels pt needs to d/c home (hopefully will help cognition).  [name] aware of posterior hip precautions and handout provided as well as HEP handout.  Pt to d/c home today.   Follow Up Recommendations  Home health PT;Supervision for mobility/OOB     Equipment Recommendations  None recommended by PT    Recommendations for Other Services       Precautions / Restrictions Precautions Precautions: Fall;Posterior Hip Precaution Comments: pt did not recall precautions, reviewed with handout Restrictions Weight Bearing Restrictions: No    Mobility  Bed Mobility Overal bed mobility: Needs Assistance Bed Mobility: Supine to Sit     Supine to sit: Min guard     General bed mobility comments: pt able to self assist LEs onto bed, min/guard for ensuring precautions  Transfers Overall transfer level: Needs assistance Equipment used: Rolling walker (2 wheeled) Transfers: Sit to/from Stand Sit to Stand: Min guard         General transfer comment: verbal cues for UE and LE positioning, maintaining posterior hip precautions  Ambulation/Gait Ambulation/Gait assistance: Min guard Gait Distance (Feet): 160 Feet Assistive device: Rolling walker (2 wheeled) Gait Pattern/deviations: Step-through pattern;Decreased stride length;Antalgic     General Gait Details: verbal cues for sequencing, RW positioning, maintaining hip precautions   Stairs Stairs: Yes Stairs assistance: Min guard Stair Management: Step to pattern;Forwards;Two rails Number of Stairs: 4 General stair  comments: verbal, visual and tactile cues for sequencing, [name] present and assisted with cues as well; pt performed twice   Wheelchair Mobility    Modified Rankin (Stroke Patients Only)       Balance                                            Cognition Arousal/Alertness: Awake/alert   Overall Cognitive Status: Impaired/Different from baseline Area of Impairment: Following commands;Problem solving                       Following Commands: Follows one step commands with increased time     Problem Solving: Slow processing;Requires verbal cues General Comments: pt confused this morning, [name] present for session      Exercises      General Comments        Pertinent Vitals/Pain Pain Assessment: 0-10 Faces Pain Scale: Hurts little more Pain Location: left hip Pain Descriptors / Indicators: Aching;Sore Pain Intervention(s): Monitored during session;Repositioned    Home Living                      Prior Function            PT Goals (current goals can now be found in the care plan section) Progress towards PT goals: Progressing toward goals    Frequency    7X/week      PT Plan Current plan remains appropriate    Co-evaluation              AM-PAC  PT "6 Clicks" Mobility   Outcome Measure  Help needed turning from your back to your side while in a flat bed without using bedrails?: A Little Help needed moving from lying on your back to sitting on the side of a flat bed without using bedrails?: A Little Help needed moving to and from a bed to a chair (including a wheelchair)?: A Little Help needed standing up from a chair using your arms (e.g., wheelchair or bedside chair)?: A Little Help needed to walk in hospital room?: A Little Help needed climbing 3-5 steps with a railing? : A Little 6 Click Score: 18    End of Session Equipment Utilized During Treatment: Gait belt Activity Tolerance: Patient tolerated  treatment well Patient left: with call bell/phone within reach;in chair;with family/visitor present Nurse Communication: Mobility status PT Visit Diagnosis: Other abnormalities of gait and mobility (R26.89)     Time: 1030-1054 PT Time Calculation (min) (ACUTE ONLY): 24 min  Charges:  $Gait Training: 23-37 mins           Arlyce Dice, DPT Acute Rehabilitation Services Office: [place] 10/10/2019, 3:14 PM

## 2019-10-10 NOTE — Progress Notes (Signed)
Subjective: 2 Days Post-Op s/p Procedure(s): ARTHROPLASTY BIPOLAR HIP (HEMIARTHROPLASTY)   Patient is alert, oriented x 3, laying comfortably in bed. Patient is hard of healing but was able to communicate with her. States she has moderate pain in left hip. Denies chest pain, SOB, calf pain, nausea or vomiting.    Objective:  PE: VITALS:   Vitals:   10/09/19 1014 10/09/19 1835 10/09/19 2110 10/10/19 0558  BP: 122/86 127/61 135/73 (!) 168/75  Pulse: 79 86 76 (!) 104  Resp: 16 16 20 18   Temp:  97.7 F (36.5 C) 97.9 F (36.6 C) 98.7 F (37.1 C)  TempSrc:  Oral Oral   SpO2: 98% 93% 96% 96%  Weight:      Height:        ABD soft Neurovascular intact Sensation intact distally Intact pulses distally Dorsiflexion/Plantar flexion intact Incision: scant drainage  LABS  Results for orders placed or performed during the hospital encounter of 10/08/19 (from the past 24 hour(s))  CBC     Status: Abnormal   Collection Time: 10/10/19  3:07 AM  Result Value Ref Range   WBC 13.6 (H) 4.0 - 10.5 K/uL   RBC 2.79 (L) 3.87 - 5.11 MIL/uL   Hemoglobin 8.9 (L) 12.0 - 15.0 g/dL   HCT 26.5 (L) 36.0 - 46.0 %   MCV 95.0 80.0 - 100.0 fL   MCH 31.9 26.0 - 34.0 pg   MCHC 33.6 30.0 - 36.0 g/dL   RDW 12.3 11.5 - 15.5 %   Platelets 271 150 - 400 K/uL   nRBC 0.0 0.0 - 0.2 %  Basic metabolic panel     Status: Abnormal   Collection Time: 10/10/19  3:07 AM  Result Value Ref Range   Sodium 141 135 - 145 mmol/L   Potassium 4.1 3.5 - 5.1 mmol/L   Chloride 106 98 - 111 mmol/L   CO2 26 22 - 32 mmol/L   Glucose, Bld 123 (H) 70 - 99 mg/dL   BUN 23 8 - 23 mg/dL   Creatinine, Ser 0.90 0.44 - 1.00 mg/dL   Calcium 9.7 8.9 - 10.3 mg/dL   GFR calc non Af Amer 58 (L) >60 mL/min   GFR calc Af Amer >60 >60 mL/min   Anion gap 9 5 - 15    Pelvis Portable  Result Date: 10/08/2019 CLINICAL DATA:  Status post total hip arthroplasty on the left. EXAM: PORTABLE PELVIS 1-2 VIEWS COMPARISON:  September 26, 2019  FINDINGS: The patient is status post total hip arthroplasty on the left. The hardware appears intact. There is no periprosthetic fracture. There are expected postsurgical changes including subcutaneous gas and overlying soft tissue edema. IMPRESSION: Status post left total hip arthroplasty with expected postsurgical changes. Electronically Signed   By: Constance Holster M.D.   On: 10/08/2019 17:37   DG Hip Port Unilat With Pelvis 1V Left  Result Date: 10/08/2019 CLINICAL DATA:  Status post total hip arthroplasty on the left. EXAM: DG HIP (WITH OR WITHOUT PELVIS) 1V PORT LEFT COMPARISON:  September 26, 2019 FINDINGS: The patient is status post total hip arthroplasty on the left. The hardware appears intact. There is no periprosthetic fracture. There are expected postsurgical changes including subcutaneous gas and overlying soft tissue edema. IMPRESSION: Status post left total hip arthroplasty with expected postsurgical changes. Electronically Signed   By: Constance Holster M.D.   On: 10/08/2019 17:37    Assessment/Plan: Principal Problem:   Pathological fracture, left femur, initial encounter for fracture (  [place])    2 Days Post-Op s/p Procedure(s): ARTHROPLASTY BIPOLAR HIP (HEMIARTHROPLASTY)  Weightbearing: WBAT LLE Insicional and dressing care: PRN dressing changes VTE prophylaxis: Lovenox, SCD's Pain control: continue current regimen Follow - up plan: Follow-up with Dr. Mardelle Matte 2 weeks after discharge Dispo: Femoral head sent for pathology, no results yet. Patient will need outpatient oncology work up when discharged. I have contacted her PCP to let them know. Patient ok to be discharged home today if passes PT. HHPT will be set up through our office.   Contact information:   Weekdays 8-5 Merlene Pulling, [place] [phone] A fter hours and holidays please check [email or web address] for group call information for [facility]  Ventura Bruns 10/10/2019, 8:23 AM

## 2019-10-10 NOTE — TOC Transition Note (Signed)
Transition of Care [facility]) - CM/SW Discharge Note   Patient Details  Name: Stephanie Duran MRN: [id number] Date of Birth: 1932/03/08  [facility]) CM/SW Contact:  Lennart Pall, LCSW Phone Number: 10/10/2019, 11:57 AM   Clinical Narrative:    Pt ready for d/c with HHPT arranged with [facility].  No further needs.   Final next level of care: [place] Barriers to Discharge: Barriers Resolved   Patient Goals and CMS Choice Patient states their goals for this hospitalization and ongoing recovery are:: "to go home!" CMS Medicare.gov Compare Post Acute Care list provided to:: Patient Represenative (must comment) Choice offered to / list presented to : Adult Children  Discharge Placement                       Discharge Plan and Services In-house Referral: Clinical Social Work              DME Arranged: N/A(has all needed DME)         HH Arranged: PT [place] Agency: [facility] (now [facility]) Date [place]: 10/10/19 Time [place]: 1156 Representative spoke with at [place]: Osa Craver  Social Determinants of Health (SDOH) Interventions     Readmission Risk Interventions Readmission Risk Prevention Plan 10/09/2019  Post Dischage Appt Complete  Medication Screening Complete  Transportation Screening Complete  Some recent data might be hidden

## 2019-10-10 NOTE — Progress Notes (Signed)
Patient taken via wheelchair to daughter.  Alert and oriented, skin warm and dry.  Discharged without any concerns or complaints.

## 2019-10-11 DIAGNOSIS — Z9181 History of falling: Secondary | ICD-10-CM | POA: Diagnosis not present

## 2019-10-11 DIAGNOSIS — H9190 Unspecified hearing loss, unspecified ear: Secondary | ICD-10-CM | POA: Diagnosis not present

## 2019-10-11 DIAGNOSIS — M84452D Pathological fracture, left femur, subsequent encounter for fracture with routine healing: Secondary | ICD-10-CM | POA: Diagnosis not present

## 2019-10-11 DIAGNOSIS — Z96642 Presence of left artificial hip joint: Secondary | ICD-10-CM | POA: Diagnosis not present

## 2019-10-11 DIAGNOSIS — I10 Essential (primary) hypertension: Secondary | ICD-10-CM | POA: Diagnosis not present

## 2019-10-11 LAB — SURGICAL PATHOLOGY

## 2019-10-12 DIAGNOSIS — Z9181 History of falling: Secondary | ICD-10-CM | POA: Diagnosis not present

## 2019-10-12 DIAGNOSIS — H9190 Unspecified hearing loss, unspecified ear: Secondary | ICD-10-CM | POA: Diagnosis not present

## 2019-10-12 DIAGNOSIS — I10 Essential (primary) hypertension: Secondary | ICD-10-CM | POA: Diagnosis not present

## 2019-10-12 DIAGNOSIS — Z96642 Presence of left artificial hip joint: Secondary | ICD-10-CM | POA: Diagnosis not present

## 2019-10-12 DIAGNOSIS — M84452D Pathological fracture, left femur, subsequent encounter for fracture with routine healing: Secondary | ICD-10-CM | POA: Diagnosis not present

## 2019-10-16 DIAGNOSIS — M1710 Unilateral primary osteoarthritis, unspecified knee: Secondary | ICD-10-CM | POA: Diagnosis not present

## 2019-10-16 DIAGNOSIS — C73 Malignant neoplasm of thyroid gland: Secondary | ICD-10-CM | POA: Diagnosis not present

## 2019-10-16 DIAGNOSIS — I10 Essential (primary) hypertension: Secondary | ICD-10-CM | POA: Diagnosis not present

## 2019-10-16 DIAGNOSIS — M84452S Pathological fracture, left femur, sequela: Secondary | ICD-10-CM | POA: Diagnosis not present

## 2019-10-16 DIAGNOSIS — C799 Secondary malignant neoplasm of unspecified site: Secondary | ICD-10-CM | POA: Diagnosis not present

## 2019-10-17 ENCOUNTER — Other Ambulatory Visit: Payer: Self-pay | Admitting: Family

## 2019-10-17 ENCOUNTER — Encounter: Payer: Self-pay | Admitting: *Deleted

## 2019-10-17 DIAGNOSIS — Z96642 Presence of left artificial hip joint: Secondary | ICD-10-CM | POA: Diagnosis not present

## 2019-10-17 DIAGNOSIS — C73 Malignant neoplasm of thyroid gland: Secondary | ICD-10-CM

## 2019-10-17 DIAGNOSIS — H9190 Unspecified hearing loss, unspecified ear: Secondary | ICD-10-CM | POA: Diagnosis not present

## 2019-10-17 DIAGNOSIS — I10 Essential (primary) hypertension: Secondary | ICD-10-CM | POA: Diagnosis not present

## 2019-10-17 DIAGNOSIS — M84452D Pathological fracture, left femur, subsequent encounter for fracture with routine healing: Secondary | ICD-10-CM | POA: Diagnosis not present

## 2019-10-17 DIAGNOSIS — Z9181 History of falling: Secondary | ICD-10-CM | POA: Diagnosis not present

## 2019-10-17 DIAGNOSIS — C799 Secondary malignant neoplasm of unspecified site: Secondary | ICD-10-CM

## 2019-10-17 NOTE — Progress Notes (Signed)
Reached out to Beau Fanny to introduce myself as the office RN Navigator and explain our new patient process. She requested that I speak to her daughter, Stephanie Duran. I then called Stephanie Duran, and reviewed the reason for their referral and scheduled their new patient appointment along with labs. Provided address and directions to the office including call back phone number. Reviewed with daughter any concerns they may have or any possible barriers to attending their appointment.   Let her know that we have gone ahead and ordered a PET scan for staging and sent off paradigm testing. She understands that she may get a phone call to schedule PET scan prior to patient's appointment with Dr Marin Olp.   Informed patient about my role as a navigator and that I will meet with them prior to their New Patient appointment and more fully discuss what services I can provide. At this time daughter has no further questions or needs.   Per Dr Marin Olp, Paradigm testing sent on specimen [phone] (DOS 10/08/19)

## 2019-10-18 ENCOUNTER — Encounter: Payer: Self-pay | Admitting: *Deleted

## 2019-10-18 DIAGNOSIS — C73 Malignant neoplasm of thyroid gland: Secondary | ICD-10-CM

## 2019-10-19 DIAGNOSIS — I10 Essential (primary) hypertension: Secondary | ICD-10-CM | POA: Diagnosis not present

## 2019-10-19 DIAGNOSIS — Z9181 History of falling: Secondary | ICD-10-CM | POA: Diagnosis not present

## 2019-10-19 DIAGNOSIS — H9190 Unspecified hearing loss, unspecified ear: Secondary | ICD-10-CM | POA: Diagnosis not present

## 2019-10-19 DIAGNOSIS — M84452D Pathological fracture, left femur, subsequent encounter for fracture with routine healing: Secondary | ICD-10-CM | POA: Diagnosis not present

## 2019-10-19 DIAGNOSIS — Z96642 Presence of left artificial hip joint: Secondary | ICD-10-CM | POA: Diagnosis not present

## 2019-10-22 DIAGNOSIS — S72042D Displaced fracture of base of neck of left femur, subsequent encounter for closed fracture with routine healing: Secondary | ICD-10-CM | POA: Diagnosis not present

## 2019-10-25 ENCOUNTER — Other Ambulatory Visit: Payer: Self-pay

## 2019-10-25 ENCOUNTER — Ambulatory Visit (HOSPITAL_COMMUNITY)
Admission: RE | Admit: 2019-10-25 | Discharge: 2019-10-25 | Disposition: A | Discharge status: Home / Self Care | Payer: Medicare Other | Source: Ambulatory Visit | Attending: Family | Admitting: Family

## 2019-10-25 DIAGNOSIS — I2510 Atherosclerotic heart disease of native coronary artery without angina pectoris: Secondary | ICD-10-CM | POA: Insufficient documentation

## 2019-10-25 DIAGNOSIS — I700 Atherosclerosis of aorta: Secondary | ICD-10-CM | POA: Insufficient documentation

## 2019-10-25 DIAGNOSIS — R918 Other nonspecific abnormal finding of lung field: Secondary | ICD-10-CM | POA: Diagnosis not present

## 2019-10-25 DIAGNOSIS — C73 Malignant neoplasm of thyroid gland: Secondary | ICD-10-CM | POA: Diagnosis not present

## 2019-10-25 DIAGNOSIS — Z96642 Presence of left artificial hip joint: Secondary | ICD-10-CM | POA: Diagnosis not present

## 2019-10-25 DIAGNOSIS — C7951 Secondary malignant neoplasm of bone: Secondary | ICD-10-CM | POA: Diagnosis not present

## 2019-10-25 LAB — GLUCOSE, CAPILLARY: Glucose-Capillary: 104 mg/dL — ABNORMAL HIGH (ref 70–99)

## 2019-10-25 MED ORDER — FLUDEOXYGLUCOSE F - 18 (FDG) INJECTION
6.6000 | Freq: Once | INTRAVENOUS | Status: AC
  Administered 2019-10-25: 6.6 via INTRAVENOUS

## 2019-10-29 ENCOUNTER — Inpatient Hospital Stay: Payer: Medicare Other

## 2019-10-29 ENCOUNTER — Encounter (HOSPITAL_COMMUNITY): Payer: Self-pay | Admitting: Radiology

## 2019-10-29 ENCOUNTER — Encounter: Payer: Self-pay | Admitting: Hematology & Oncology

## 2019-10-29 ENCOUNTER — Other Ambulatory Visit: Payer: Self-pay

## 2019-10-29 ENCOUNTER — Inpatient Hospital Stay: Payer: Medicare Other | Attending: Hematology & Oncology | Admitting: Hematology & Oncology

## 2019-10-29 ENCOUNTER — Encounter: Payer: Self-pay | Admitting: *Deleted

## 2019-10-29 VITALS — BP 145/82 | HR 86 | Temp 97.1°F | Resp 19 | Wt 127.0 lb

## 2019-10-29 DIAGNOSIS — C73 Malignant neoplasm of thyroid gland: Secondary | ICD-10-CM | POA: Diagnosis not present

## 2019-10-29 DIAGNOSIS — M84452A Pathological fracture, left femur, initial encounter for fracture: Secondary | ICD-10-CM

## 2019-10-29 DIAGNOSIS — R630 Anorexia: Secondary | ICD-10-CM

## 2019-10-29 DIAGNOSIS — F17210 Nicotine dependence, cigarettes, uncomplicated: Secondary | ICD-10-CM | POA: Insufficient documentation

## 2019-10-29 DIAGNOSIS — R918 Other nonspecific abnormal finding of lung field: Secondary | ICD-10-CM | POA: Insufficient documentation

## 2019-10-29 DIAGNOSIS — R634 Abnormal weight loss: Secondary | ICD-10-CM

## 2019-10-29 DIAGNOSIS — C7951 Secondary malignant neoplasm of bone: Secondary | ICD-10-CM

## 2019-10-29 LAB — CMP (CANCER CENTER ONLY)
ALT: 9 U/L (ref 0–44)
AST: 14 U/L — ABNORMAL LOW (ref 15–41)
Albumin: 3.8 g/dL (ref 3.5–5.0)
Alkaline Phosphatase: 117 U/L (ref 38–126)
Anion gap: 8 (ref 5–15)
BUN: 17 mg/dL (ref 8–23)
CO2: 29 mmol/L (ref 22–32)
Calcium: 9.7 mg/dL (ref 8.9–10.3)
Chloride: 99 mmol/L (ref 98–111)
Creatinine: 0.85 mg/dL (ref 0.44–1.00)
GFR, Est AFR Am: >60 mL/min (ref >60)
GFR, Estimated: >60 mL/min (ref >60)
Glucose, Bld: 178 mg/dL — ABNORMAL HIGH (ref 70–99)
Potassium: 3.1 mmol/L — ABNORMAL LOW (ref 3.5–5.1)
Sodium: 136 mmol/L (ref 135–145)
Total Bilirubin: 0.4 mg/dL (ref 0.3–1.2)
Total Protein: 6.9 g/dL (ref 6.5–8.1)

## 2019-10-29 LAB — CBC WITH DIFFERENTIAL (CANCER CENTER ONLY)
Abs Immature Granulocytes: 0.04 10*3/uL (ref 0.00–0.07)
Basophils Absolute: 0 10*3/uL (ref 0.0–0.1)
Basophils Relative: 1 %
Eosinophils Absolute: 0.1 10*3/uL (ref 0.0–0.5)
Eosinophils Relative: 1 %
HCT: 27.7 % — ABNORMAL LOW (ref 36.0–46.0)
Hemoglobin: 9.5 g/dL — ABNORMAL LOW (ref 12.0–15.0)
Immature Granulocytes: 1 %
Lymphocytes Relative: 15 %
Lymphs Abs: 1.3 10*3/uL (ref 0.7–4.0)
MCH: 31.6 pg (ref 26.0–34.0)
MCHC: 34.3 g/dL (ref 30.0–36.0)
MCV: 92 fL (ref 80.0–100.0)
Monocytes Absolute: 0.7 10*3/uL (ref 0.1–1.0)
Monocytes Relative: 8 %
Neutro Abs: 6.4 10*3/uL (ref 1.7–7.7)
Neutrophils Relative %: 74 %
Platelet Count: 418 10*3/uL — ABNORMAL HIGH (ref 150–400)
RBC: 3.01 MIL/uL — ABNORMAL LOW (ref 3.87–5.11)
RDW: 13.6 % (ref 11.5–15.5)
WBC Count: 8.5 10*3/uL (ref 4.0–10.5)
nRBC: 0 % (ref 0.0–0.2)

## 2019-10-29 LAB — LACTATE DEHYDROGENASE: LDH: 194 U/L — ABNORMAL HIGH (ref 98–192)

## 2019-10-29 MED ORDER — FUSION PLUS PO CAPS: 1.0000 | ORAL_CAPSULE | Freq: Every day | ORAL | 5 refills | Status: AC

## 2019-10-29 MED ORDER — MEGESTROL ACETATE 400 MG/10ML PO SUSP: 800.0000 mg | Freq: Every day | ORAL | 3 refills | Status: DC

## 2019-10-29 NOTE — Progress Notes (Signed)
Leanora A. Schnackenberg Female, 86 y.o., 06/27/1932 MRN:  [id number] Phone:  [phone] (H) ... PCP:  Cyndi Bender, PA-C Coverage:  [place] [facility] ([facility] 1) 11/02/2019 at 8:30 AM  RE: CT Biopsy Received: Today Message Contents  Sandi Mariscal, MD  Garth Bigness D  OK for CT guided L UL Bx - PET CT image 30, series 8.   Moderate to high risk given cavitary appearance, COPD and pt's advanced age.   Patient has metastatic thyroid CA, but Ennever is concerned the L UL lesion may be a second primary given history of smoking.   Please schedule at [facility].   Cathren Harsh       Previous Messages   ----- Message -----  From: Garth Bigness D  Sent: 10/29/2019  3:06 PM EDT  To: Ir Procedure Requests  Subject: CT Biopsy                     Procedure:  CT Biopsy   Reason: Pathological fracture, left femur, initial encounter for fracture, Cavitary mass in left upper lung. History of smoking   History: NM PET in computer   Provider: Volanda Napoleon   Provider Contact: [phone]

## 2019-10-29 NOTE — Progress Notes (Signed)
Referral MD  Reason for Referral: Metastatic thyroid cancer-pathologic fracture of the left femur; left upper lobe lung mass  Chief Complaint  Patient presents with  . New Patient (Initial Visit)  : I am had a cancer in my bone.  HPI: [name] is a very charming 86 year old white female.  Unfortunately, she is very hard of hearing.  Thankfully, her daughter came in with her.  She has a long history of tobacco use.  She probably has 125-pack-year history of tobacco use.  She is still smoking.  She began to have pain in the left hip about 3 months or so ago.  She did not fall.  She had lost a little bit of weight.  She had no change in bowel or bladder habits.  She had a little bit of a cough.  She did not have hemoptysis.  She ultimately was admitted to the hospital.She was admitted on 10/08/2019.  She subsequently had to have hip repair.  Shockingly, she was found to have metastatic thyroid cancer.  The pathology report ([id number]) showed metastatic carcinoma consistent with a thyroid primary.  No other x-rays were done in the hospital.  We did a PET scan on her.  This was done on 10/25/2019.  The PET scan showed some calcification of the left inferior thyroid measuring 1.3 x 1.3 cm.  She had a large cavitary mass in the left upper lobe with an SUV of 15.5.  This measured 3.2 x 3.4 cm.  She had pulmonary nodules in the right lung with very little activity.  All the bones looked okay.  She has not yet seen any other doctor.  She seems begin around okay.  She uses a rolling walker.  Again she is still smoking.  She has lost weight.  Appetite is decreased.  We will have to give her some Megace to try to help with her appetite.  She has had no change in bowel or bladder habits.  She has had no headache.  Overall, I would say her performance status is probably ECOG 2.     Past Medical History:  Diagnosis Date  . Borderline systolic HTN   . Hypertension   . Pneumonia   :  Past  Surgical History:  Procedure Laterality Date  . ABDOMINAL HYSTERECTOMY    . APPENDECTOMY    . CHOLECYSTECTOMY    . HIP ARTHROPLASTY Left 10/08/2019   Procedure: ARTHROPLASTY BIPOLAR HIP (HEMIARTHROPLASTY);  Surgeon: Marchia Bond, MD;  Location: [facility];  Service: Orthopedics;  Laterality: Left;  . left knee arthroscopy    :   Current Outpatient Medications:  .  amitriptyline (ELAVIL) 10 MG tablet, Take 10 mg by mouth daily., Disp: , Rfl:  .  amLODipine (NORVASC) 5 MG tablet, Take 5 mg by mouth daily., Disp: , Rfl:  .  Cholecalciferol (VITAMIN D3 PO), Take 1 capsule by mouth daily., Disp: , Rfl:  .  enoxaparin (LOVENOX) 40 MG/0.4ML injection, Inject 0.4 mLs (40 mg total) into the skin daily., Disp: 12 mL, Rfl: 0 .  HYDROcodone-acetaminophen (NORCO) 10-325 MG tablet, Take 1 tablet by mouth every 6 (six) hours as needed., Disp: 28 tablet, Rfl: 0 .  ondansetron (ZOFRAN) 4 MG tablet, Take 1 tablet (4 mg total) by mouth every 8 (eight) hours as needed for nausea or vomiting., Disp: 10 tablet, Rfl: 0 .  polyvinyl alcohol (LIQUIFILM TEARS) 1.4 % ophthalmic solution, Place 1 drop into both eyes as needed for dry eyes., Disp: , Rfl:  .  propranolol (INDERAL) 40 MG tablet, Take 40 mg by mouth daily., Disp: , Rfl:  .  sennosides-docusate sodium (SENOKOT-S) 8.6-50 MG tablet, Take 2 tablets by mouth daily., Disp: 30 tablet, Rfl: 1 .  SYMBICORT 160-4.5 MCG/ACT inhaler, Inhale 2 puffs into the lungs 2 (two) times daily., Disp: , Rfl:  .  temazepam (RESTORIL) 15 MG capsule, Take 15 mg by mouth at bedtime., Disp: , Rfl:  .  VITAMIN A PO, Take 1 capsule by mouth daily., Disp: , Rfl: :  :  Allergies  Allergen Reactions  . Valium [Diazepam]     Anxious/ " makes me climb walls"  :  History reviewed. No pertinent family history.:  Social History   Socioeconomic History  . Marital status: Widowed    Spouse name: Not on file  . Number of children: Not on file  . Years of education: Not on file  .  Highest education level: Not on file  Occupational History  . Not on file  Tobacco Use  . Smoking status: Current Every Day Smoker    Packs/day: 1.00    Years: 15.00    Pack years: 15.00    Types: Cigarettes  . Smokeless tobacco: Never Used  . Tobacco comment: has audible wheezing and SOB with extertion.  Substance and Sexual Activity  . Alcohol use: Not on file  . Drug use: Not on file  . Sexual activity: Not on file  Other Topics Concern  . Not on file  Social History Narrative  . Not on file   Social Determinants of Health   Financial Resource Strain:   . Difficulty of Paying Living Expenses:   Food Insecurity:   . Worried About Charity fundraiser in the Last Year:   . Arboriculturist in the Last Year:   Transportation Needs:   . Film/video editor (Medical):   Marland Kitchen Lack of Transportation (Non-Medical):   Physical Activity:   . Days of Exercise per Week:   . Minutes of Exercise per Session:   Stress:   . Feeling of Stress :   Social Connections:   . Frequency of Communication with Friends and Family:   . Frequency of Social Gatherings with Friends and Family:   . Attends Religious Services:   . Active Member of Clubs or Organizations:   . Attends Archivist Meetings:   Marland Kitchen Marital Status:   Intimate Partner Violence:   . Fear of Current or Ex-Partner:   . Emotionally Abused:   Marland Kitchen Physically Abused:   . Sexually Abused:   : Review of Systems  Constitutional: Positive for weight loss.  HENT: Positive for hearing loss.   Eyes: Negative.   Respiratory: Positive for cough.   Cardiovascular: Negative.   Gastrointestinal: Negative.   Genitourinary: Negative.   Musculoskeletal: Positive for joint pain.  Skin: Negative.   Neurological: Negative.   Endo/Heme/Allergies: Negative.   Psychiatric/Behavioral: Negative.       Exam: This is an elderly white female in no obvious distress.  She is hard of hearing.  Her vital signs show temperature 97.1.   Pulse 86.  Blood pressure 145/82.  Weight is 127 pounds.  Head and neck exam shows no ocular or oral lesions.  She has no palpable cervical or supraclavicular lymph nodes.  Thyroid is not palpable.  Lungs are clear bilaterally.  Cardiac exam regular rate and rhythm with no murmurs, rubs or bruits.  Abdomen is soft.  She has good bowel sounds.  There  is no fluid wave.  There is no guarding or rebound tenderness.  There is no palpable liver or spleen tip.  Back exam shows no tenderness over the spine, ribs or hips.  Extremities shows the hip surgery that is healing on the left hip.  She has decreased range of motion of the left hip.  There is no swelling in the legs.  She has decent strength in her joints.  Neurological exam is nonfocal.  Skin exam shows no rashes, ecchymoses or petechia.  @IPVITALS @   Recent Labs    10/29/19 1115  WBC 8.5  HGB 9.5*  HCT 27.7*  PLT 418*   Recent Labs    10/29/19 1115  NA 136  K 3.1*  CL 99  CO2 29  GLUCOSE 178*  BUN 17  CREATININE 0.85  CALCIUM 9.7    Blood smear review: None  Pathology: See above    Assessment and Plan: [name] is a very charming 41 year old white female.  I really worry that she may actually have 2 primaries.  We certainly know that there is the metastatic thyroid cancer.  It is hard to say whether there is the primary lesion in the thyroid in the left lobe.  I am worried about the left upper lung cavitary mass.  This certainly looks like a primary bronchogenic carcinoma.  It is very active on PET scan.  The other small pulmonary nodules have very little activity which may represent metastatic thyroid cancer.  I suspect that she probably will need to see endocrinology.  She probably needs to have a thyroid scan done to see if there is metastatic disease.  I suspect that she might need to have the thyroid taken out.  I realize she is 86 years old and surgery certainly would not be all that easy on her.  I think we probably  are going to have to get a dedicated CT scan of the neck to look at the thyroid.  I think that we also will need to have a biopsy of this left upper lung mass.  It is peripherally located so it should be relatively easy to biopsy.  I spent a good hour with Ms. Rickert and her daughter.  Her daughter is a Marine scientist over at the surgical center.  As such, she is well informed as to what is going on.  Our goal is quality of life.  Ms. Ault will need radiation therapy for the left hip.  This should be easy on her.  I would not think that she should get sick from this.  I will speak with radiation oncology today about this.  We will try to take care of of a lot of issues over the phone just to help prevent Ms. Bry from having to come in here which is a little bit difficult although her family is very willing to get her to where she needs to be.  We gave Ms. Doody a prayer blanket.  She has a very strong faith.

## 2019-10-29 NOTE — Progress Notes (Signed)
Initial RN Navigator Patient Visit  Name: Stephanie Duran Date of Referral : 10/17/19 Diagnosis: ? Metastatic Thyroid Cancer  Met with patient prior to their visit with MD. Hanley Seamen patient "Your Patient Navigator" handout which explains my role, areas in which I am able to help, and all the contact information for myself and the office. Also gave patient MD and Navigator business card. Reviewed with patient the general overview of expected course after initial diagnosis and time frame for all steps to be completed.  New patient packet given to patient which includes: orientation to office and staff; campus directory; education on My Chart and Advance Directives; and patient centered education on head and neck cancer.  Patient completed visit with Dr. Marin Olp. After MD visit, patient will need  CT Neck - Scheduled for 11/02/19 at 8:30a CT Biopsy - will follow to confirm scheduling Referral to Radiation - Order placed Endocrinology - Patient's daughter has received call to schedule, but she deferred until after today's appointment. Encouraged to call back to schedule.   Patient's daughter, Stephanie Duran, is aware of CT appointment. She also is aware that other locations will be calling to schedule  Patient understands all follow up procedures and expectations. They have my number to reach out for any further clarification or additional needs.

## 2019-10-30 ENCOUNTER — Telehealth: Payer: Self-pay | Admitting: Hematology & Oncology

## 2019-10-30 ENCOUNTER — Encounter: Payer: Self-pay | Admitting: *Deleted

## 2019-10-30 NOTE — Telephone Encounter (Signed)
No los 4/19

## 2019-10-30 NOTE — Progress Notes (Signed)
Following patient appointments.  Patient scheduled for consultation on 4/27 with Radiation Oncology Patient scheduled for biopsy 5/3. Follow up with Dr Marin Olp scheduled for   Dr Marin Olp updated to all appointment dates

## 2019-10-31 ENCOUNTER — Encounter (HOSPITAL_COMMUNITY): Payer: Self-pay | Admitting: Hematology & Oncology

## 2019-11-02 ENCOUNTER — Other Ambulatory Visit: Payer: Self-pay

## 2019-11-02 ENCOUNTER — Ambulatory Visit (HOSPITAL_COMMUNITY)
Admission: RE | Admit: 2019-11-02 | Discharge: 2019-11-02 | Disposition: A | Discharge status: Home / Self Care | Payer: Medicare Other | Source: Ambulatory Visit | Attending: Hematology & Oncology | Admitting: Hematology & Oncology

## 2019-11-02 DIAGNOSIS — M84452A Pathological fracture, left femur, initial encounter for fracture: Secondary | ICD-10-CM | POA: Insufficient documentation

## 2019-11-02 DIAGNOSIS — C73 Malignant neoplasm of thyroid gland: Secondary | ICD-10-CM | POA: Diagnosis not present

## 2019-11-02 MED ORDER — IOHEXOL 300 MG/ML  SOLN
75.0000 mL | Freq: Once | INTRAMUSCULAR | Status: AC | PRN
  Administered 2019-11-02: 75 mL via INTRAVENOUS

## 2019-11-02 MED ORDER — SODIUM CHLORIDE (PF) 0.9 % IJ SOLN
INTRAMUSCULAR | Status: AC
  Filled 2019-11-02: qty 50

## 2019-11-02 NOTE — Progress Notes (Signed)
Histology and Location of Primary Cancer:  Metastatic thyroid cancer (potentially primary bronchogenic carcinoma as well)  Surgical Pathology 10/08/2019 FINAL MICROSCOPIC DIAGNOSIS:  A. FEMORAL HEAD, LEFT, RESECTION:  - Metastatic carcinoma, consistent with a thyroid primary.  Location(s) of Symptomatic Metastases: LEFT hip  Past/Anticipated chemotherapy by medical oncology, if any:  Under care of Dr. Burney Gauze: I think we probably are going to have to get a dedicated CT scan of the neck to look at the thyroid (done 11/02/2019). I think that we also will need to have a biopsy of this left upper lung mass.  It is peripherally located so it should be relatively easy to biopsy.  Pain on a scale of 0-10 is: Spoke with patient's daughter, unable to assess.   If Spine Met(s), symptoms, if any, include:  Bowel/Bladder retention or incontinence (please describe): Chronic constipation (takes Linzess about twice a week to manage); otherwise no issues  Numbness or weakness in extremities (please describe): Daughter notices weakness but says it is improving  Current Decadron regimen, if applicable: N/A  Ambulatory status? Walker? Wheelchair?: Ambulatory with rolling walker but also needs standby assistance  SAFETY ISSUES:  Prior radiation? No  Pacemaker/ICD? No  Possible current pregnancy? No  Is the patient on methotrexate? No  Current Complaints / other details:   Had LEFT hip hemiarthroplasty on 10/08/2019 by Dr. Marchia Bond. Neck CT on 11/02/2019 showed multiple small thyroid nodules. Dr. Marin Olp is recommending having thyroid removed, but patient's daughter Tye Maryland is worried her mother won't be able to tolerate surgery given her age and history of COPD. Tye Maryland would like to know if there are other less invasive options like radioative iodine. Patient is scheduled for CT guided biopsy on 11/12/2019 and evaluation by Endocrinology on 11/15/2019.

## 2019-11-05 ENCOUNTER — Telehealth: Payer: Self-pay | Admitting: Radiation Oncology

## 2019-11-05 NOTE — Telephone Encounter (Signed)
-----   Message from Volanda Napoleon, MD sent at 11/05/2019 12:44 PM EDT ----- Call her dgtr -- the thyroid has a few nodules. One might be malignant.  I suspect she will need to have the thyroid removed!!  Thanks!!  Laurey Arrow

## 2019-11-05 NOTE — Telephone Encounter (Signed)
Notified pt daughter Tye Maryland of MD recommendations. Cathy's concerns given to MD for review.  Will contact [place] with additional information/MD recommendations.  Current schedule for pt showing CT biopsy 5/3, New pt appt w/ Endocrine 5/6. Follow up with MD 5/13

## 2019-11-06 ENCOUNTER — Ambulatory Visit
Admission: RE | Admit: 2019-11-06 | Discharge: 2019-11-06 | Disposition: A | Discharge status: Home / Self Care | Payer: Medicare Other | Source: Ambulatory Visit | Attending: Radiation Oncology | Admitting: Radiation Oncology

## 2019-11-06 ENCOUNTER — Other Ambulatory Visit: Payer: Self-pay

## 2019-11-06 ENCOUNTER — Encounter: Payer: Self-pay | Admitting: Radiation Oncology

## 2019-11-06 DIAGNOSIS — C7951 Secondary malignant neoplasm of bone: Secondary | ICD-10-CM | POA: Diagnosis not present

## 2019-11-06 DIAGNOSIS — M84452A Pathological fracture, left femur, initial encounter for fracture: Secondary | ICD-10-CM | POA: Diagnosis not present

## 2019-11-06 DIAGNOSIS — Z96642 Presence of left artificial hip joint: Secondary | ICD-10-CM | POA: Diagnosis not present

## 2019-11-06 DIAGNOSIS — C73 Malignant neoplasm of thyroid gland: Secondary | ICD-10-CM | POA: Diagnosis not present

## 2019-11-06 HISTORY — DX: Chronic obstructive pulmonary disease, unspecified: J44.9

## 2019-11-06 NOTE — Progress Notes (Signed)
Radiation Oncology         [phone] ________________________________  Initial outpatient Consultation by telephone.  The patient opted for telemedicine to maximize safety during the pandemic.  MyChart video was not obtainable.  Name: Stephanie Duran MRN: [id number]  Date: 11/06/2019  DOB: Mar 04, 1932  IR:[id number], Bebe Shaggy, PA-C   REFERRING PHYSICIAN: Cyndi Bender, PA-C  DIAGNOSIS: C79.51   ICD-10-CM   1. Pathological fracture, left femur, initial encounter for fracture ([place])  M84.452A   2. Bone metastasis ([place])  C79.51    Stage IV thyroid cancer  CHIEF COMPLAINT: Here to discuss management of metastatic thyroid cancer  HISTORY OF PRESENT ILLNESS::Stephanie Duran is a 86 y.o. female who presented with left hip pain after a fall in 06/2019. She was referred to orthopedics, who recommended MRI when plain films were relatively inconclusive. Left hip MRI performed on 10/05/2019 revealed: 4.5 cm marrow-replacing lesion occupying entirely of left femoral neck extending into posteroinferior portion of left femoral head and intertrochanteric region, with suspected femoral neck pathologic fracture.  She underwent emergent left hip arthroplasty on 10/08/2019 under Dr. Mardelle Matte. Pathology from the procedure revealed metastatic carcinoma, consistent with a thyroid primary.    She underwent PET scan on 10/25/2019 showing: calcified lesion in left thyroid without hypermetabolic features; hypermetabolic cavitary left pulmonary mass with associated adenopathy; numerous smaller pulmonary nodules without significant metabolic activity suspicious for metastasis from thyroid cancer.  She met with Dr. Marin Olp on 10/29/2019, who recommended neck CT and referral to endocrinology. Neck CT was performed on 11/02/2019 and showed no evidence of metastatic disease in the neck.  There are multiple small thyroid nodules consistent with malignancy.  She is scheduled for biopsy of the left pulmonary mass on  11/12/2019 and to meet with endocrinology on 11/15/2019.  Note, the patient is extremely hard of hearing. Her daughter provides the history.  I personally reviewed the patient's images.  PREVIOUS RADIATION THERAPY: No  PAST MEDICAL HISTORY:  has a past medical history of Borderline systolic HTN, COPD (chronic obstructive pulmonary disease) ([place]), Hypertension, and Pneumonia.    PAST SURGICAL HISTORY: Past Surgical History:  Procedure Laterality Date  . ABDOMINAL HYSTERECTOMY    . APPENDECTOMY    . CHOLECYSTECTOMY    . HIP ARTHROPLASTY Left 10/08/2019   Procedure: ARTHROPLASTY BIPOLAR HIP (HEMIARTHROPLASTY);  Surgeon: Marchia Bond, MD;  Location: [facility];  Service: Orthopedics;  Laterality: Left;  . left knee arthroscopy      FAMILY HISTORY: family history is not on file.  SOCIAL HISTORY:  reports that she has been smoking cigarettes. She has a 15.00 pack-year smoking history. She has never used smokeless tobacco.  ALLERGIES: Valium [diazepam]  MEDICATIONS:  Current Outpatient Medications  Medication Sig Dispense Refill  . amitriptyline (ELAVIL) 10 MG tablet Take 10 mg by mouth daily.    Marland Kitchen amLODipine (NORVASC) 5 MG tablet Take 5 mg by mouth daily.    . Cholecalciferol (VITAMIN D3 PO) Take 1 capsule by mouth daily.    Marland Kitchen enoxaparin (LOVENOX) 40 MG/0.4ML injection Inject 0.4 mLs (40 mg total) into the skin daily. 12 mL 0  . HYDROcodone-acetaminophen (NORCO) 10-325 MG tablet Take 1 tablet by mouth every 6 (six) hours as needed. 28 tablet 0  . Iron-FA-B Cmp-C-Biot-Probiotic (FUSION PLUS) CAPS Take 1 capsule by mouth daily. 30 capsule 5  . linaCLOtide (LINZESS PO) Take by mouth as needed (Per daughter: takes twice a week).    . megestrol (MEGACE) 400 MG/10ML suspension Take 20  mLs (800 mg total) by mouth daily. 480 mL 3  . ondansetron (ZOFRAN) 4 MG tablet Take 1 tablet (4 mg total) by mouth every 8 (eight) hours as needed for nausea or vomiting. 10 tablet 0  . polyvinyl alcohol (LIQUIFILM  TEARS) 1.4 % ophthalmic solution Place 1 drop into both eyes as needed for dry eyes.    . propranolol (INDERAL) 40 MG tablet Take 40 mg by mouth 2 (two) times daily.     . sennosides-docusate sodium (SENOKOT-S) 8.6-50 MG tablet Take 2 tablets by mouth daily. 30 tablet 1  . SYMBICORT 160-4.5 MCG/ACT inhaler Inhale 2 puffs into the lungs 2 (two) times daily.    . temazepam (RESTORIL) 15 MG capsule Take 15 mg by mouth at bedtime.    . VITAMIN A PO Take 1 capsule by mouth daily.     No current facility-administered medications for this encounter.    REVIEW OF SYSTEMS:  Notable for that above.   PHYSICAL EXAM:  vitals were not taken for this visit.     LABORATORY DATA:  Lab Results  Component Value Date   WBC 8.5 10/29/2019   HGB 9.5 (L) 10/29/2019   HCT 27.7 (L) 10/29/2019   MCV 92.0 10/29/2019   PLT 418 (H) 10/29/2019   CMP     Component Value Date/Time   NA 136 10/29/2019 1115   K 3.1 (L) 10/29/2019 1115   CL 99 10/29/2019 1115   CO2 29 10/29/2019 1115   GLUCOSE 178 (H) 10/29/2019 1115   BUN 17 10/29/2019 1115   CREATININE 0.85 10/29/2019 1115   CALCIUM 9.7 10/29/2019 1115   PROT 6.9 10/29/2019 1115   ALBUMIN 3.8 10/29/2019 1115   AST 14 (L) 10/29/2019 1115   ALT 9 10/29/2019 1115   ALKPHOS 117 10/29/2019 1115   BILITOT 0.4 10/29/2019 1115   GFRNONAA >60 10/29/2019 1115   GFRAA >60 10/29/2019 1115         RADIOGRAPHY: CT Soft Tissue Neck W Contrast  Result Date: 11/02/2019 CLINICAL DATA:  Metastatic thyroid cancer. EXAM: CT NECK WITH CONTRAST TECHNIQUE: Multidetector CT imaging of the neck was performed using the standard protocol following the bolus administration of intravenous contrast. CONTRAST:  75mL OMNIPAQUE IOHEXOL 300 MG/ML  SOLN COMPARISON:  PET-CT 10/25/2019 FINDINGS: Pharynx and larynx: No evidence of mass or swelling. Salivary glands: No inflammation, mass, or stone. Thyroid: Numerous small hypoattenuating nodules in the thyroid bilaterally measuring up to  1.3 cm in the left upper pole. 1.0 cm calcified nodule in the left lower pole. No invasion of the surrounding soft tissues. Lymph nodes: No enlarged or suspicious lymph nodes in the neck. Vascular: Extensive, primarily calcified plaque in the aortic arch and at the carotid bifurcations with evidence of mild-to-moderate right greater than left proximal ICA stenoses. Limited intracranial: Unremarkable. Visualized orbits: Not included. Mastoids and visualized paranasal sinuses: Evidence of chronic right maxillary sinusitis with minimal mucosal thickening currently. Clear mastoid air cells. Skeleton: Moderate lower cervical spondylosis and advanced upper cervical facet arthrosis. No suspicious osseous lesion. Upper chest: Moderately advanced centrilobular emphysema. 4 mm right upper lobe nodule (series 4, image 97). Partially visualized 10 mm right upper lobe nodule (series 4, image 113). Other: None. IMPRESSION: 1. No evidence of metastatic disease in the neck. 2. Multiple small thyroid nodules as seen on the prior PET-CT in this patient with known thyroid malignancy. 3. Partial imaging of known lung nodules concerning for metastatic disease. 4. Aortic Atherosclerosis (ICD10-I70.0) and Emphysema (ICD10-J43.9). Electronically Signed     By: Allen  Grady M.D.   On: 11/02/2019 17:02   Pelvis Portable  Result Date: 10/08/2019 CLINICAL DATA:  Status post total hip arthroplasty on the left. EXAM: PORTABLE PELVIS 1-2 VIEWS COMPARISON:  September 26, 2019 FINDINGS: The patient is status post total hip arthroplasty on the left. The hardware appears intact. There is no periprosthetic fracture. There are expected postsurgical changes including subcutaneous gas and overlying soft tissue edema. IMPRESSION: Status post left total hip arthroplasty with expected postsurgical changes. Electronically Signed   By: Christopher  Green M.D.   On: 10/08/2019 17:37   NM PET Image Initial (PI) Skull Base To Thigh  Result Date:  10/26/2019 CLINICAL DATA:  Initial treatment strategy for thyroid cancer, found to have a pathologic fracture of the left hip status post recent left hip arthroplasty. EXAM: NUCLEAR MEDICINE PET SKULL BASE TO THIGH TECHNIQUE: 6.6 mCi F-18 FDG was injected intravenously. Full-ring PET imaging was performed from the skull base to thigh after the radiotracer. CT data was obtained and used for attenuation correction and anatomic localization. Fasting blood glucose: 104 mg/dl COMPARISON:  None. FINDINGS: Mediastinal blood pool activity: SUV max 2.18 Liver activity: SUV max NA NECK: No hypermetabolic lymph nodes in the neck. Lesion with stippled calcification in the left inferior thyroid measuring 1.3 x 1.3 cm (image 53, series 4) (SUVmax = 2.2) Incidental CT findings: none CHEST: Cavitary mass measuring 3.2 x 3.4 cm in the left upper lobe (image 30, series 8) (SUVmax = 15.5) Pulmonary lesion in the right upper lobe measuring 1 cm (image 21, series 8) (SUVmax = 0.8) Right middle pulmonary nodule (image 35, series 8) (SUVmax = 0.7) Right lower lobe pulmonary nodule (image 46, series 8) 6 mm also displaying limited SUV uptake at least 6 additional small lesions in the chest. Small left hilar lymph node, difficult to visualize on noncontrast CT likely approximately 7 mm (image 71 series 4) (SUVmax = 3.8) w Incidental CT findings: Calcified atherosclerotic plaque in the thoracic aorta. Heart size is normal. Calcified coronary artery disease. No pericardial effusion. No axillary lymphadenopathy or chest wall mass. ABDOMEN/PELVIS: No abnormal hypermetabolic activity within the liver, pancreas, adrenal glands, or spleen. No hypermetabolic lymph nodes in the abdomen or pelvis. Incidental CT findings: Noncontrast appearance of liver is unremarkable. Post cholecystectomy. Pancreas and spleen are normal. Adrenal glands with thickening of the left adrenal gland with nodule of the body of the adrenal gland measuring 1.3 x 1.3 cm  (image 111, series 4) is (SUVmax = 2.5) mean attenuation approximately 9 Hounsfield units. Noncontrast appearance of the kidneys is unremarkable, no sign of hydronephrosis. No acute bowel process. Colonic diverticulosis. Streak artifact from left hip arthroplasty limiting assessment of pelvic structures. SKELETON: Periarticular increased metabolic activity about the postoperative changes, recent postoperative changes about the left hip. Small amount low-attenuation about the left proximal femur in the area of the operative bed. No gas in this location. Mean density less than muscle but approximately 30 Hounsfield units and confounded by adjacent artifact from the total hip arthroplasty. Incidental CT findings: Spinal degenerative changes and left hip arthroplasty. IMPRESSION: 1. Calcified lesion in the left thyroid without hypermetabolic features, potentially related to known thyroid cancer, consider dedicated ultrasound evaluation and biopsy as warranted. 2. Hypermetabolic cavitary left pulmonary mass with associated adenopathy, suspicious for primary pulmonary neoplasm with hilar adenopathy versus is metastatic disease this differential is entertained due to the start contrast between this lesion and other presumed thyroid metastases in terms of metabolic activity and morphology. 3.   Numerous smaller pulmonary nodules without significant metabolic activity suspicious for metastasis from thyroid cancer. 4. Periarticular uptake following left hip arthroplasty is nonspecific. Low-attenuation posteriorly may represent postoperative changes. Correlation with any changes in symptoms or signs of infection is suggested. Electronically Signed   By: Geoffrey  Wile M.D.   On: 10/26/2019 08:20   DG Hip Port Unilat With Pelvis 1V Left  Result Date: 10/08/2019 CLINICAL DATA:  Status post total hip arthroplasty on the left. EXAM: DG HIP (WITH OR WITHOUT PELVIS) 1V PORT LEFT COMPARISON:  September 26, 2019 FINDINGS: The patient is  status post total hip arthroplasty on the left. The hardware appears intact. There is no periprosthetic fracture. There are expected postsurgical changes including subcutaneous gas and overlying soft tissue edema. IMPRESSION: Status post left total hip arthroplasty with expected postsurgical changes. Electronically Signed   By: Christopher  Green M.D.   On: 10/08/2019 17:37      IMPRESSION/PLAN: Pathologic left hip fracture, secondary to Metastatic Thyroid Cancer   Today, I talked about the findings and work-up thus far. We discussed the patient's diagnosis of metastatic thyroid cancer and general treatment for this, highlighting the role of radiotherapy in the management. We discussed the available radiation techniques, and focused on the details of logistics and delivery.  She has endocrinology consultation pending, as well as a biopsy of her lung pending.  I recommended that the patient receive a palliative course of radiotherapy to her left hip and femur.  I recommend she received 20 Gray in 5 fractions.  Expected side effects would be skin irritation and fatigue.  Serious injury to internal tissues would be rare.  The patient's daughter believes that the patient will be agreeable to this plan.  She will talk to her personally.  We will go ahead and schedule her for treatment planning at that time in May that is convenient for the patient's schedule.  Her daughter knows to call if the patient changes her mind.  The patient receives assistance from [facility] -she also has excellent family support according to her daughter.  I also recommended a Palliative Care referral and discussed the possible benefits and services of palliative care.  Her daughter is interested with talking to somebody from palliative care by phone.  I will make that referral.  We discussed measures to reduce the risk of infection during the COVID-19 pandemic.  I recommend that the patient received the Covid vaccine.   Her daughter is agreeable to this.  She or her brother will talk to her mother again about this recommendation.  So far her mom has been hesitant to receive the vaccine.  I explained how they can schedule her for vaccine if and when she is agreeable.  Because the patient was so hard of hearing she did not participate in this consultation. This encounter was provided by telemedicine platform by telephone.  The patient opted for telemedicine to maximize safety during the pandemic.  MyChart video was not obtainable. The patient has given verbal consent for this type of encounter and has been advised to only accept a meeting of this type in a secure network environment. The time spent during this encounter was 40 minutes. The attendants for this meeting include Sarah Squire  and Cathy Duran, patient's daughter During the encounter, Sarah Squire was located at [facility] Radiation Oncology Department.  Cathy Duran was at home.   __________________________________________   Sarah Squire, MD  This document serves as a record of services   personally performed by Eppie Gibson, MD. It was created on her behalf by Wilburn Mylar, a trained medical scribe. The creation of this record is based on the scribe's personal observations and the provider's statements to them. This document has been checked and approved by the attending provider.

## 2019-11-07 ENCOUNTER — Other Ambulatory Visit: Payer: Self-pay | Admitting: Radiation Therapy

## 2019-11-08 ENCOUNTER — Encounter: Payer: Self-pay | Admitting: *Deleted

## 2019-11-08 ENCOUNTER — Other Ambulatory Visit: Payer: Self-pay | Admitting: Radiology

## 2019-11-09 ENCOUNTER — Other Ambulatory Visit (HOSPITAL_COMMUNITY)
Admission: RE | Admit: 2019-11-09 | Discharge: 2019-11-09 | Disposition: A | Discharge status: Home / Self Care | Payer: Medicare Other | Source: Ambulatory Visit | Attending: Hematology & Oncology | Admitting: Hematology & Oncology

## 2019-11-09 ENCOUNTER — Other Ambulatory Visit: Payer: Self-pay | Admitting: Student

## 2019-11-09 DIAGNOSIS — Z20822 Contact with and (suspected) exposure to covid-19: Secondary | ICD-10-CM | POA: Insufficient documentation

## 2019-11-09 DIAGNOSIS — Z01812 Encounter for preprocedural laboratory examination: Secondary | ICD-10-CM | POA: Diagnosis not present

## 2019-11-09 DIAGNOSIS — H26492 Other secondary cataract, left eye: Secondary | ICD-10-CM | POA: Diagnosis not present

## 2019-11-09 DIAGNOSIS — H26491 Other secondary cataract, right eye: Secondary | ICD-10-CM | POA: Diagnosis not present

## 2019-11-09 LAB — THYROGLOBULIN LEVEL: Thyroglobulin: 330 ng/mL — ABNORMAL HIGH

## 2019-11-09 LAB — SARS CORONAVIRUS 2 (TAT 6-24 HRS): SARS Coronavirus 2: NEGATIVE

## 2019-11-12 ENCOUNTER — Encounter (HOSPITAL_COMMUNITY): Payer: Self-pay

## 2019-11-12 ENCOUNTER — Other Ambulatory Visit: Payer: Self-pay

## 2019-11-12 ENCOUNTER — Ambulatory Visit (HOSPITAL_COMMUNITY)
Admission: RE | Admit: 2019-11-12 | Discharge: 2019-11-12 | Disposition: A | Discharge status: Home / Self Care | Payer: Medicare Other | Source: Ambulatory Visit | Attending: Hematology & Oncology | Admitting: Hematology & Oncology

## 2019-11-12 ENCOUNTER — Telehealth: Payer: Self-pay | Admitting: Internal Medicine

## 2019-11-12 DIAGNOSIS — Z79899 Other long term (current) drug therapy: Secondary | ICD-10-CM | POA: Insufficient documentation

## 2019-11-12 DIAGNOSIS — C3412 Malignant neoplasm of upper lobe, left bronchus or lung: Secondary | ICD-10-CM | POA: Insufficient documentation

## 2019-11-12 DIAGNOSIS — J449 Chronic obstructive pulmonary disease, unspecified: Secondary | ICD-10-CM | POA: Diagnosis not present

## 2019-11-12 DIAGNOSIS — R918 Other nonspecific abnormal finding of lung field: Secondary | ICD-10-CM | POA: Diagnosis not present

## 2019-11-12 DIAGNOSIS — I10 Essential (primary) hypertension: Secondary | ICD-10-CM | POA: Diagnosis not present

## 2019-11-12 DIAGNOSIS — M84452A Pathological fracture, left femur, initial encounter for fracture: Secondary | ICD-10-CM

## 2019-11-12 DIAGNOSIS — M84552A Pathological fracture in neoplastic disease, left femur, initial encounter for fracture: Secondary | ICD-10-CM | POA: Diagnosis not present

## 2019-11-12 LAB — PROTIME-INR
INR: 0.9 (ref 0.8–1.2)
Prothrombin Time: 11.6 seconds (ref 11.4–15.2)

## 2019-11-12 LAB — CBC
HCT: 31 % — ABNORMAL LOW (ref 36.0–46.0)
Hemoglobin: 10.2 g/dL — ABNORMAL LOW (ref 12.0–15.0)
MCH: 31.1 pg (ref 26.0–34.0)
MCHC: 32.9 g/dL (ref 30.0–36.0)
MCV: 94.5 fL (ref 80.0–100.0)
Platelets: 411 10*3/uL — ABNORMAL HIGH (ref 150–400)
RBC: 3.28 MIL/uL — ABNORMAL LOW (ref 3.87–5.11)
RDW: 14 % (ref 11.5–15.5)
WBC: 8.8 10*3/uL (ref 4.0–10.5)
nRBC: 0 % (ref 0.0–0.2)

## 2019-11-12 MED ORDER — LIDOCAINE HCL 1 % IJ SOLN
INTRAMUSCULAR | Status: AC
  Filled 2019-11-12: qty 20

## 2019-11-12 MED ORDER — FENTANYL CITRATE (PF) 100 MCG/2ML IJ SOLN
INTRAMUSCULAR | Status: DC | PRN
  Administered 2019-11-12: 50 ug via INTRAVENOUS

## 2019-11-12 MED ORDER — MIDAZOLAM HCL 2 MG/2ML IJ SOLN
INTRAMUSCULAR | Status: AC
  Filled 2019-11-12: qty 2

## 2019-11-12 MED ORDER — FENTANYL CITRATE (PF) 100 MCG/2ML IJ SOLN
INTRAMUSCULAR | Status: AC
  Filled 2019-11-12: qty 2

## 2019-11-12 MED ORDER — HYDROCODONE-ACETAMINOPHEN 5-325 MG PO TABS: 1.0000 | ORAL_TABLET | ORAL | Status: DC | PRN

## 2019-11-12 MED ORDER — MIDAZOLAM HCL 2 MG/2ML IJ SOLN
INTRAMUSCULAR | Status: DC | PRN
  Administered 2019-11-12: 1 mg via INTRAVENOUS

## 2019-11-12 MED ORDER — SODIUM CHLORIDE 0.9 % IV SOLN: INTRAVENOUS | Status: DC

## 2019-11-12 NOTE — Progress Notes (Signed)
Discharge instructions reviewed with pt and pt's daughter-in-law. Packet given to Sallie (DIL). Verbalized understanding, all questions answered.

## 2019-11-12 NOTE — H&P (Signed)
Chief Complaint: Patient was seen in consultation today for left lung mass biopsy at the request of Ennever,Peter R  Referring Physician(s): Ennever,Peter R  Supervising Physician: Arne Cleveland  Patient Status: [facility] - Out-pt  History of Present Illness: Stephanie Duran is a 86 y.o. female   No Ca Hx known Onset left hip pain approx 2 mo ago Discovery of pathologic fracture of left femur 3/29: A. FEMORAL HEAD, LEFT, RESECTION:  - Metastatic carcinoma, consistent with a thyroid primary  Imaging work up and referral to Dr Marin Olp CT 4/23: IMPRESSION: 1. No evidence of metastatic disease in the neck. 2. Multiple small thyroid nodules as seen on the prior PET-CT in this patient with known thyroid malignancy. 3. Partial imaging of known lung nodules concerning for metastatic disease.  PET 10/25/19: IMPRESSION: 1. Calcified lesion in the left thyroid without hypermetabolic features, potentially related to known thyroid cancer, consider dedicated ultrasound evaluation and biopsy as warranted. 2. Hypermetabolic cavitary left pulmonary mass with associated adenopathy, suspicious for primary pulmonary neoplasm with hilar adenopathy versus is metastatic disease this differential is entertained due to the start contrast between this lesion and other presumed thyroid metastases in terms of metabolic activity and morphology. 3. Numerous smaller pulmonary nodules without significant metabolic activity suspicious for metastasis from thyroid cancer. 4. Periarticular uptake following left hip arthroplasty is nonspecific. Low-attenuation posteriorly may represent postoperative changes. Correlation with any changes in symptoms or signs of infection is suggested.  ++ smoker Scheduled now for biopsy of LUL mass per Dr Marin Olp request Need to determine also metastasis vs new primary   Past Medical History:  Diagnosis Date  . Borderline systolic HTN   . COPD (chronic obstructive  pulmonary disease) ([place])    per patient's daughter Tye Maryland  . Hypertension   . Pneumonia     Past Surgical History:  Procedure Laterality Date  . ABDOMINAL HYSTERECTOMY    . APPENDECTOMY    . CHOLECYSTECTOMY    . HIP ARTHROPLASTY Left 10/08/2019   Procedure: ARTHROPLASTY BIPOLAR HIP (HEMIARTHROPLASTY);  Surgeon: Marchia Bond, MD;  Location: [facility] ORS;  Service: Orthopedics;  Laterality: Left;  . left knee arthroscopy      Allergies: Valium [diazepam]  Medications: Prior to Admission medications   Medication Sig Start Date End Date Taking? Authorizing Provider  acetaminophen (TYLENOL) 500 MG tablet Take 1,000 mg by mouth every 6 (six) hours as needed for moderate pain.   Yes [provider]  amitriptyline (ELAVIL) 10 MG tablet Take 10 mg by mouth at bedtime.  09/26/19  Yes [provider]  Cholecalciferol (VITAMIN D3) 50 MCG (2000 UT) TABS Take 4,000 Units by mouth every evening.    Yes [provider]  CINNAMON PO Take by mouth.   Yes [provider]  docusate sodium (COLACE) 100 MG capsule Take 100 mg by mouth daily as needed for mild constipation.   Yes [provider]  enoxaparin (LOVENOX) 40 MG/0.4ML injection Inject 0.4 mLs (40 mg total) into the skin daily. 10/10/19  Yes Merlene Pulling K, PA-C  HYDROcodone-acetaminophen (NORCO) 10-325 MG tablet Take 1 tablet by mouth every 6 (six) hours as needed. 10/10/19  Yes Merlene Pulling K, PA-C  Iron-FA-B Cmp-C-Biot-Probiotic (FUSION PLUS) CAPS Take 1 capsule by mouth daily. Patient taking differently: Take 1 capsule by mouth every evening.  10/29/19  Yes Ennever, Rudell Cobb, MD  linaclotide (LINZESS) 72 MCG capsule Take 72 mcg by mouth 2 (two) times a week.    Yes [provider]  megestrol (MEGACE) 400 MG/10ML suspension Take 20 mLs (800 mg total) by mouth daily. Patient taking differently: Take 800 mg by mouth daily as needed (appetite).  10/29/19  Yes Volanda Napoleon, MD  ondansetron (ZOFRAN)  4 MG tablet Take 1 tablet (4 mg total) by mouth every 8 (eight) hours as needed for nausea or vomiting. 10/08/19  Yes Merlene Pulling K, PA-C  polyvinyl alcohol (LIQUIFILM TEARS) 1.4 % ophthalmic solution Place 1 drop into both eyes as needed for dry eyes.   Yes [provider]  propranolol (INDERAL) 40 MG tablet Take 40 mg by mouth 2 (two) times daily.  09/26/19  Yes [provider]  SYMBICORT 160-4.5 MCG/ACT inhaler Inhale 2 puffs into the lungs 2 (two) times daily as needed (shortness of breath).  10/13/19  Yes [provider]  temazepam (RESTORIL) 15 MG capsule Take 15 mg by mouth at bedtime. 09/26/19  Yes [provider]  vitamin A 3 MG (10000 UNITS) capsule Take 10,000 Units by mouth daily.    Yes [provider]  sennosides-docusate sodium (SENOKOT-S) 8.6-50 MG tablet Take 2 tablets by mouth daily. Patient not taking: Reported on 11/08/2019 10/08/19   Ventura Bruns, PA-C     History reviewed. No pertinent family history.  Social History   Socioeconomic History  . Marital status: Widowed    Spouse name: Not on file  . Number of children: Not on file  . Years of education: Not on file  . Highest education level: Not on file  Occupational History  . Not on file  Tobacco Use  . Smoking status: Current Every Day Smoker    Packs/day: 1.00    Years: 15.00    Pack years: 15.00    Types: Cigarettes  . Smokeless tobacco: Never Used  . Tobacco comment: has audible wheezing and SOB with extertion.  Substance and Sexual Activity  . Alcohol use: Not on file  . Drug use: Not on file  . Sexual activity: Not on file  Other Topics Concern  . Not on file  Social History Narrative  . Not on file   Social Determinants of Health   Financial Resource Strain:   . Difficulty of Paying Living Expenses:   Food Insecurity:   . Worried About Charity fundraiser in the Last Year:   . Arboriculturist in the Last Year:   Transportation Needs:   . Lexicographer (Medical):   Marland Kitchen Lack of Transportation (Non-Medical):   Physical Activity:   . Days of Exercise per Week:   . Minutes of Exercise per Session:   Stress:   . Feeling of Stress :   Social Connections:   . Frequency of Communication with Friends and Family:   . Frequency of Social Gatherings with Friends and Family:   . Attends Religious Services:   . Active Member of Clubs or Organizations:   . Attends Archivist Meetings:   Marland Kitchen Marital Status:     Review of Systems: A 12 point ROS discussed and pertinent positives are indicated in the HPI above.  All other systems are negative.  Review of Systems  Constitutional: Negative for activity change, appetite change, fever and unexpected weight change.  HENT: Positive for hearing loss. Negative for sore throat and trouble swallowing.   Respiratory: Positive for shortness of breath. Negative for cough.   Cardiovascular: Negative for chest pain.  Gastrointestinal: Negative for abdominal pain.  Musculoskeletal: Positive for gait problem.  Neurological:  Negative for weakness.  Psychiatric/Behavioral: Negative for behavioral problems and confusion.    Vital Signs: BP (!) 156/67   Pulse 92   Temp (!) 97.5 F (36.4 C) (Skin)   Resp 14   Ht 5\' 3"  (1.6 m)   Wt 135 lb (61.2 kg)   SpO2 100%   BMI 23.91 kg/m   Physical Exam Vitals reviewed.  Cardiovascular:     Rate and Rhythm: Normal rate and regular rhythm.     Heart sounds: Normal heart sounds.  Pulmonary:     Effort: Pulmonary effort is normal.     Breath sounds: Wheezing present.  Abdominal:     Palpations: Abdomen is soft.     Tenderness: There is no abdominal tenderness.  Musculoskeletal:        General: Normal range of motion.  Skin:    General: Skin is warm and dry.  Neurological:     Mental Status: She is alert and oriented to person, place, and time.  Psychiatric:        Mood and Affect: Mood normal.        Behavior: Behavior normal.         Thought Content: Thought content normal.        Judgment: Judgment normal.     Imaging: CT Soft Tissue Neck W Contrast  Result Date: 11/02/2019 CLINICAL DATA:  Metastatic thyroid cancer. EXAM: CT NECK WITH CONTRAST TECHNIQUE: Multidetector CT imaging of the neck was performed using the standard protocol following the bolus administration of intravenous contrast. CONTRAST:  51mL OMNIPAQUE IOHEXOL 300 MG/ML  SOLN COMPARISON:  PET-CT 10/25/2019 FINDINGS: Pharynx and larynx: No evidence of mass or swelling. Salivary glands: No inflammation, mass, or stone. Thyroid: Numerous small hypoattenuating nodules in the thyroid bilaterally measuring up to 1.3 cm in the left upper pole. 1.0 cm calcified nodule in the left lower pole. No invasion of the surrounding soft tissues. Lymph nodes: No enlarged or suspicious lymph nodes in the neck. Vascular: Extensive, primarily calcified plaque in the aortic arch and at the carotid bifurcations with evidence of mild-to-moderate right greater than left proximal ICA stenoses. Limited intracranial: Unremarkable. Visualized orbits: Not included. Mastoids and visualized paranasal sinuses: Evidence of chronic right maxillary sinusitis with minimal mucosal thickening currently. Clear mastoid air cells. Skeleton: Moderate lower cervical spondylosis and advanced upper cervical facet arthrosis. No suspicious osseous lesion. Upper chest: Moderately advanced centrilobular emphysema. 4 mm right upper lobe nodule (series 4, image 97). Partially visualized 10 mm right upper lobe nodule (series 4, image 113). Other: None. IMPRESSION: 1. No evidence of metastatic disease in the neck. 2. Multiple small thyroid nodules as seen on the prior PET-CT in this patient with known thyroid malignancy. 3. Partial imaging of known lung nodules concerning for metastatic disease. 4. Aortic Atherosclerosis (ICD10-I70.0) and Emphysema (ICD10-J43.9). Electronically Signed   By: Logan Bores M.D.   On: 11/02/2019  17:02   NM PET Image Initial (PI) Skull Base To Thigh  Result Date: 10/26/2019 CLINICAL DATA:  Initial treatment strategy for thyroid cancer, found to have a pathologic fracture of the left hip status post recent left hip arthroplasty. EXAM: NUCLEAR MEDICINE PET SKULL BASE TO THIGH TECHNIQUE: 6.6 mCi F-18 FDG was injected intravenously. Full-ring PET imaging was performed from the skull base to thigh after the radiotracer. CT data was obtained and used for attenuation correction and anatomic localization. Fasting blood glucose: 104 mg/dl COMPARISON:  None. FINDINGS: Mediastinal blood pool activity: SUV max 2.18 Liver activity: SUV max NA  NECK: No hypermetabolic lymph nodes in the neck. Lesion with stippled calcification in the left inferior thyroid measuring 1.3 x 1.3 cm (image 53, series 4) (SUVmax = 2.2) Incidental CT findings: none CHEST: Cavitary mass measuring 3.2 x 3.4 cm in the left upper lobe (image 30, series 8) (SUVmax = 15.5) Pulmonary lesion in the right upper lobe measuring 1 cm (image 21, series 8) (SUVmax = 0.8) Right middle pulmonary nodule (image 35, series 8) (SUVmax = 0.7) Right lower lobe pulmonary nodule (image 46, series 8) 6 mm also displaying limited SUV uptake at least 6 additional small lesions in the chest. Small left hilar lymph node, difficult to visualize on noncontrast CT likely approximately 7 mm (image 71 series 4) (SUVmax = 3.8) w Incidental CT findings: Calcified atherosclerotic plaque in the thoracic aorta. Heart size is normal. Calcified coronary artery disease. No pericardial effusion. No axillary lymphadenopathy or chest wall mass. ABDOMEN/PELVIS: No abnormal hypermetabolic activity within the liver, pancreas, adrenal glands, or spleen. No hypermetabolic lymph nodes in the abdomen or pelvis. Incidental CT findings: Noncontrast appearance of liver is unremarkable. Post cholecystectomy. Pancreas and spleen are normal. Adrenal glands with thickening of the left adrenal gland  with nodule of the body of the adrenal gland measuring 1.3 x 1.3 cm (image 111, series 4) is (SUVmax = 2.5) mean attenuation approximately 9 Hounsfield units. Noncontrast appearance of the kidneys is unremarkable, no sign of hydronephrosis. No acute bowel process. Colonic diverticulosis. Streak artifact from left hip arthroplasty limiting assessment of pelvic structures. SKELETON: Periarticular increased metabolic activity about the postoperative changes, recent postoperative changes about the left hip. Small amount low-attenuation about the left proximal femur in the area of the operative bed. No gas in this location. Mean density less than muscle but approximately 30 Hounsfield units and confounded by adjacent artifact from the total hip arthroplasty. Incidental CT findings: Spinal degenerative changes and left hip arthroplasty. IMPRESSION: 1. Calcified lesion in the left thyroid without hypermetabolic features, potentially related to known thyroid cancer, consider dedicated ultrasound evaluation and biopsy as warranted. 2. Hypermetabolic cavitary left pulmonary mass with associated adenopathy, suspicious for primary pulmonary neoplasm with hilar adenopathy versus is metastatic disease this differential is entertained due to the start contrast between this lesion and other presumed thyroid metastases in terms of metabolic activity and morphology. 3. Numerous smaller pulmonary nodules without significant metabolic activity suspicious for metastasis from thyroid cancer. 4. Periarticular uptake following left hip arthroplasty is nonspecific. Low-attenuation posteriorly may represent postoperative changes. Correlation with any changes in symptoms or signs of infection is suggested. Electronically Signed   By: Zetta Bills M.D.   On: 10/26/2019 08:20    Labs:  CBC: Recent Labs    10/08/19 1302 10/09/19 0355 10/10/19 0307 10/29/19 1115  WBC 10.0 14.6* 13.6* 8.5  HGB 12.0 9.2* 8.9* 9.5*  HCT 35.8* 27.3*  26.5* 27.7*  PLT 329 243 271 418*    COAGS: Recent Labs    10/08/19 1302  INR 1.0    BMP: Recent Labs    10/08/19 1302 10/09/19 0355 10/10/19 0307 10/29/19 1115  NA 138 138 141 136  K 4.1 4.2 4.1 3.1*  CL 102 104 106 99  CO2 29 26 26 29   GLUCOSE 113* 150* 123* 178*  BUN 25* 28* 23 17  CALCIUM 9.9 9.4 9.7 9.7  CREATININE 0.82 0.86 0.90 0.85  GFRNONAA >60 >60 58* >60  GFRAA >60 >60 >60 >60    LIVER FUNCTION TESTS: Recent Labs    10/08/19  1302 10/29/19 1115  BILITOT 1.0 0.4  AST 14* 14*  ALT 11 9  ALKPHOS 83 117  PROT 7.3 6.9  ALBUMIN 3.7 3.8    TUMOR MARKERS: No results for input(s): AFPTM, CEA, CA199, CHROMGRNA in the last 8760 hours.  Assessment and Plan:  Newly dx thyroid cancer with metastasis to left femur - pathologic fracture Work up imaging reveals LUL mass; +PET Scheduled now for LUL mass biopsy- new primary? ++ Smoker Risks and benefits of CT guided lung nodule biopsy was discussed with the patient including, but not limited to bleeding, hemoptysis, respiratory failure requiring intubation, infection, pneumothorax requiring chest tube placement, stroke from air embolism or even death.  All of the patient's questions were answered and the patient is agreeable to proceed. Consent signed and in chart.   Thank you for this interesting consult.  I greatly enjoyed meeting Stephanie Duran and look forward to participating in their care.  A copy of this report was sent to the requesting provider on this date.  Electronically Signed: Lavonia Drafts, PA-C 11/12/2019, 10:18 AM   I spent a total of  30 Minutes   in face to face in clinical consultation, greater than 50% of which was counseling/coordinating care for LUL mass biopsy

## 2019-11-12 NOTE — Discharge Instructions (Addendum)

## 2019-11-12 NOTE — Procedures (Signed)
  Procedure: CT guided core bx LUL cavitary mass   EBL:   minimal Complications:  none immediate  See full dictation in [facility].  Dillard Cannon MD Main # [phone] Pager  [phone]

## 2019-11-14 ENCOUNTER — Encounter: Payer: Self-pay | Admitting: *Deleted

## 2019-11-14 LAB — SURGICAL PATHOLOGY

## 2019-11-14 NOTE — Progress Notes (Signed)
Received a call from Parker, patient's daughter. At this time, patient would like to cancel all appointments. She states that patient wants to take the next two weeks and think about her options. At the end of this time she will notify us regarding her decision.   Appointments with our office cancelled. Message sent to [facility] for them to follow up and cancel appointments with their office. Tye Maryland knows to call me if they don't reach out to her to confirm appointment cancellation.

## 2019-11-15 ENCOUNTER — Ambulatory Visit: Payer: Medicare Other | Admitting: Internal Medicine

## 2019-11-15 ENCOUNTER — Telehealth: Payer: Self-pay

## 2019-11-15 NOTE — Telephone Encounter (Signed)
Received message from [facility] that patient and her daughter have expressed desire the cancel upcoming appointments for the next 2 weeks to deliberate more about patient's options. Called patient's daughter Tye Maryland [phone]) but got no answer. Left a VM informing her I would update Dr. Isidore Moos and our radiation team so those appointments can be canceled. Provided direct call back number for [facility] or patient to call me back on should they have any questions/concerns while making their final decision (and then of course to call with an update once they have made their decision). Will continue to support as needed.   Message sent to Dr. Isidore Moos, [facility], and CT sim with above update.

## 2019-11-16 ENCOUNTER — Encounter: Payer: Self-pay | Admitting: Radiation Oncology

## 2019-11-16 NOTE — Progress Notes (Signed)
Received a message from Dr. Marin Olp informing me of the patient's lung biopsy results.  He is wondering if we can provide radiation therapy to her lung mass as well.  This is certainly feasible.  Path:  FINAL MICROSCOPIC DIAGNOSIS:   A. LUNG, LEFT UPPER LOBE, NEEDLE CORE BIOPSY:  - Poorly differentiated non-small cell carcinoma.  - See comment.   COMMENT:  The carcinoma shows weak positivity with p63 and focal positivity with  p40. The carcinoma is negative with Napsin-A, TTF-1, thyroglobulin and  cytokeratin 5/6. 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    Of note, however, the patient's daughter has notified the team that the patient would like to cancel all of her appointments for the next 2 weeks.  She is unsure if she wants to pursue treatment.  For now, we have cancel her appointments and will hold any procedures.   The patient continues to be navigated by Charlsie Merles at [facility]. Egbert Garibaldi, radiation oncology nurse, has also left a message with the patient's daughter to let her know that they should call at any time if they would like to be rescheduled.  -----------------------------------  Eppie Gibson, MD

## 2019-11-18 ENCOUNTER — Other Ambulatory Visit: Payer: Self-pay

## 2019-11-18 ENCOUNTER — Ambulatory Visit (INDEPENDENT_AMBULATORY_CARE_PROVIDER_SITE_OTHER): Payer: Medicare Other

## 2019-11-18 ENCOUNTER — Ambulatory Visit
Admission: EM | Admit: 2019-11-18 | Discharge: 2019-11-18 | Disposition: A | Discharge status: Home / Self Care | Payer: Medicare Other | Attending: Emergency Medicine | Admitting: Emergency Medicine

## 2019-11-18 DIAGNOSIS — N3000 Acute cystitis without hematuria: Secondary | ICD-10-CM | POA: Insufficient documentation

## 2019-11-18 DIAGNOSIS — R05 Cough: Secondary | ICD-10-CM | POA: Diagnosis not present

## 2019-11-18 LAB — POCT URINALYSIS DIP (MANUAL ENTRY)
Bilirubin, UA: NEGATIVE
Blood, UA: NEGATIVE
Glucose, UA: NEGATIVE mg/dL
Ketones, POC UA: NEGATIVE mg/dL
Nitrite, UA: POSITIVE — AB
Protein Ur, POC: NEGATIVE mg/dL
Spec Grav, UA: 1.025 (ref 1.010–1.025)
Urobilinogen, UA: 1 E.U./dL
pH, UA: 6 (ref 5.0–8.0)

## 2019-11-18 MED ORDER — FLUCONAZOLE 150 MG PO TABS
150.0000 mg | ORAL_TABLET | Freq: Every day | ORAL | 0 refills | Status: DC
Start: 2019-11-18 — End: 2020-01-03

## 2019-11-18 MED ORDER — CEPHALEXIN 500 MG PO CAPS: 500.0000 mg | ORAL_CAPSULE | Freq: Two times a day (BID) | ORAL | 0 refills | Status: AC

## 2019-11-18 NOTE — ED Provider Notes (Signed)
[facility]    CSN: [id number] Arrival date & time: 11/18/19  1455      History   Chief Complaint Chief Complaint  Patient presents with  . Urinary Tract Infection    HPI Stephanie Duran is a 86 y.o. female with history of hypertension, COPD, thyroid cancer bone metastasis, small cell lung carcinoma, and recent femur fracture presenting with her daughter for UTI and pneumonia screening.  History provided by daughter due to patient's difficulty hearing: Has had increased nocturia last night as well as worsening cough for a few days.  No fever, change in breathing, chest pain.  Patient did have episode this morning of confusion with disorientation.  No facial droop, dysarthria, weakness or numbness.  Denies history of stroke or heart attack.  Patient was on Lovenox until 9 days ago.  No abdominal pain, vomiting, hematochezia, melena.  No known sick contacts.  Daughter, who is a caregiver and RN, intends on following up with primary care, though requesting urine dipstick, chest x-ray today.    Past Medical History:  Diagnosis Date  . Borderline systolic HTN   . COPD (chronic obstructive pulmonary disease) ([place])    per patient's daughter Tye Maryland  . Hypertension   . Pneumonia     Patient Active Problem List   Diagnosis Date Noted  . Bone metastasis ([place]) 11/06/2019  . Pathological fracture, left femur, initial encounter for fracture ([place]) 10/08/2019    Past Surgical History:  Procedure Laterality Date  . ABDOMINAL HYSTERECTOMY    . APPENDECTOMY    . CHOLECYSTECTOMY    . HIP ARTHROPLASTY Left 10/08/2019   Procedure: ARTHROPLASTY BIPOLAR HIP (HEMIARTHROPLASTY);  Surgeon: Marchia Bond, MD;  Location: [facility];  Service: Orthopedics;  Laterality: Left;  . left knee arthroscopy      OB History   No obstetric history on file.      Home Medications    Prior to Admission medications   Medication Sig Start Date End Date Taking? Authorizing Provider  acetaminophen  (TYLENOL) 500 MG tablet Take 1,000 mg by mouth every 6 (six) hours as needed for moderate pain.    [provider]  amitriptyline (ELAVIL) 10 MG tablet Take 10 mg by mouth at bedtime.  09/26/19   [provider]  cephALEXin (KEFLEX) 500 MG capsule Take 1 capsule (500 mg total) by mouth 2 (two) times daily for 5 days. 11/18/19 11/23/19  Hall-Potvin, [place], PA-C  Cholecalciferol (VITAMIN D3) 50 MCG (2000 UT) TABS Take 4,000 Units by mouth every evening.     [provider]  CINNAMON PO Take by mouth.    [provider]  docusate sodium (COLACE) 100 MG capsule Take 100 mg by mouth daily as needed for mild constipation.    [provider]  enoxaparin (LOVENOX) 40 MG/0.4ML injection Inject 0.4 mLs (40 mg total) into the skin daily. 10/10/19   Ventura Bruns, PA-C  fluconazole (DIFLUCAN) 150 MG tablet Take 1 tablet (150 mg total) by mouth daily. May repeat in 72 hours if needed 11/18/19   Hall-Potvin, [place], PA-C  HYDROcodone-acetaminophen (NORCO) 10-325 MG tablet Take 1 tablet by mouth every 6 (six) hours as needed. 10/10/19   Merlene Pulling K, PA-C  Iron-FA-B Cmp-C-Biot-Probiotic (FUSION PLUS) CAPS Take 1 capsule by mouth daily. Patient taking differently: Take 1 capsule by mouth every evening.  10/29/19   Volanda Napoleon, MD  linaclotide (LINZESS) 72 MCG capsule Take 72 mcg by mouth 2 (two) times a week.  [provider]  megestrol (MEGACE) 400 MG/10ML suspension Take 20 mLs (800 mg total) by mouth daily. Patient taking differently: Take 800 mg by mouth daily as needed (appetite).  10/29/19   Volanda Napoleon, MD  ondansetron (ZOFRAN) 4 MG tablet Take 1 tablet (4 mg total) by mouth every 8 (eight) hours as needed for nausea or vomiting. 10/08/19   Merlene Pulling K, PA-C  polyvinyl alcohol (LIQUIFILM TEARS) 1.4 % ophthalmic solution Place 1 drop into both eyes as needed for dry eyes.    [provider]  propranolol (INDERAL) 40 MG tablet Take  40 mg by mouth 2 (two) times daily.  09/26/19   [provider]  sennosides-docusate sodium (SENOKOT-S) 8.6-50 MG tablet Take 2 tablets by mouth daily. Patient not taking: Reported on 11/08/2019 10/08/19   Ventura Bruns, PA-C  SYMBICORT 160-4.5 MCG/ACT inhaler Inhale 2 puffs into the lungs 2 (two) times daily as needed (shortness of breath).  10/13/19   [provider]  temazepam (RESTORIL) 15 MG capsule Take 15 mg by mouth at bedtime. 09/26/19   [provider]  vitamin A 3 MG (10000 UNITS) capsule Take 10,000 Units by mouth daily.     [provider]    Family History No family history on file.  Social History Social History   Tobacco Use  . Smoking status: Current Every Day Smoker    Packs/day: 1.00    Years: 15.00    Pack years: 15.00    Types: Cigarettes  . Smokeless tobacco: Never Used  . Tobacco comment: has audible wheezing and SOB with extertion.  Substance Use Topics  . Alcohol use: Not on file  . Drug use: Not on file     Allergies   Valium [diazepam]   Review of Systems As per HPI   Physical Exam Triage Vital Signs ED Triage Vitals  Enc Vitals Group     BP      Pulse      Resp      Temp      Temp src      SpO2      Weight      Height      Head Circumference      Peak Flow      Pain Score      Pain Loc      Pain Edu?      Excl. in [place]?    No data found.  Updated Vital Signs BP (!) 146/84 (BP Location: Left Arm)   Pulse 96   Temp 98.3 F (36.8 C) (Oral)   Resp 18   SpO2 96%   Visual Acuity Right Eye Distance:   Left Eye Distance:   Bilateral Distance:    Right Eye Near:   Left Eye Near:    Bilateral Near:     Physical Exam Constitutional:      General: She is not in acute distress.    Appearance: She is not ill-appearing.  HENT:     Head: Normocephalic and atraumatic.     Mouth/Throat:     Mouth: Mucous membranes are moist.     Pharynx: Oropharynx is clear.  Eyes:     General: No scleral  icterus.    Pupils: Pupils are equal, round, and reactive to light.  Neck:     Comments: Trachea midline, negative JVD Cardiovascular:     Rate and Rhythm: Normal rate and regular rhythm.  Pulmonary:     Effort: Pulmonary effort  is normal. No respiratory distress.     Breath sounds: Rhonchi present. No wheezing or rales.  Abdominal:     General: Bowel sounds are normal.     Palpations: Abdomen is soft.     Tenderness: There is no abdominal tenderness.  Musculoskeletal:     Cervical back: No tenderness.     Right lower leg: No edema.     Left lower leg: No edema.  Lymphadenopathy:     Cervical: No cervical adenopathy.  Skin:    Coloration: Skin is not jaundiced or pale.     Findings: No erythema.  Neurological:     Mental Status: She is alert and oriented to person, place, and time.      UC Treatments / Results  Labs (all labs ordered are listed, but only abnormal results are displayed) Labs Reviewed  POCT URINALYSIS DIP (MANUAL ENTRY) - Abnormal; Notable for the following components:      Result Value   Clarity, UA cloudy (*)    Nitrite, UA Positive (*)    Leukocytes, UA Trace (*)    All other components within normal limits  URINE CULTURE    EKG   Radiology DG Chest 2 View  Result Date: 11/18/2019 CLINICAL DATA:  Worsening cough. EXAM: CHEST - 2 VIEW COMPARISON:  PET-CT dated 10/25/2019 FINDINGS: Again noted is a cavitary left upper lobe lung lesion as previously described. There is no pneumothorax. The heart size is mildly enlarged. Additional small bilateral pulmonary nodules are noted, most evident on the right. There is no significant pleural effusion. Atherosclerotic changes are noted of the thoracic aorta. Degenerative changes are noted throughout the visualized thoracolumbar spine. There is no definite acute displaced fracture. IMPRESSION: 1. No acute cardiopulmonary process. 2. Persistent left upper lobe lung mass as previously described. Additional smaller  bilateral pulmonary nodules are again noted, better visualized on the patient's prior PET-CT. Electronically Signed   By: Constance Holster M.D.   On: 11/18/2019 15:46    Procedures Procedures (including critical care time)  Medications Ordered in UC Medications - No data to display  Initial Impression / Assessment and Plan / UC Course  I have reviewed the triage vital signs and the nursing notes.  Pertinent labs & imaging results that were available during my care of the patient were reviewed by me and considered in my medical decision making (see chart for details).     Patient afebrile, nontoxic in office today.  Urine dipstick significant for positive leukocytes, nitrates: Culture pending.  Chest x-ray done office, reviewed by me and radiology, compared to PET-CT from 10/25/2019: Significant for persistent left upper lobe lung mass is previously described as well as additional smaller bilateral pulmonary nodules noted.  Overall stable.  Reviewed findings with daughter and patient verbalized understanding.  Will treat acute cystitis with Keflex, provide Diflucan for post antibiotics yeast infection.  Return precautions discussed, patient verbalized understanding and is agreeable to plan. Final Clinical Impressions(s) / UC Diagnoses   Final diagnoses:  Acute cystitis without hematuria     Discharge Instructions     Take antibiotic twice daily with food. Important to drink plenty of water throughout the day. Return for worsening urinary symptoms, blood in urine, abdominal or back pain, fever.    ED Prescriptions    Medication Sig Dispense Auth. Provider   cephALEXin (KEFLEX) 500 MG capsule Take 1 capsule (500 mg total) by mouth 2 (two) times daily for 5 days. 10 capsule Hall-Potvin, [place], PA-C   fluconazole (  DIFLUCAN) 150 MG tablet Take 1 tablet (150 mg total) by mouth daily. May repeat in 72 hours if needed 2 tablet Hall-Potvin, [place], PA-C     PDMP not reviewed this  encounter.   Hall-Potvin, [place], [place] 11/18/19 1557

## 2019-11-18 NOTE — Discharge Instructions (Signed)
Take antibiotic twice daily with food. Important to drink plenty of water throughout the day. Return for worsening urinary symptoms, blood in urine, abdominal or back pain, fever.

## 2019-11-18 NOTE — ED Triage Notes (Signed)
Per daughter pt woke up this am with confusion and disoriented. States she has an around the clock sitter that stays with her and said she was up to the bathroom 3-4 times last night. Pt stated her urine had an odor.

## 2019-11-20 ENCOUNTER — Inpatient Hospital Stay: Payer: Medicare Other | Attending: Hematology & Oncology

## 2019-11-21 LAB — URINE CULTURE: Culture: >=100000 — AB

## 2019-11-22 ENCOUNTER — Inpatient Hospital Stay: Payer: Medicare Other

## 2019-11-22 ENCOUNTER — Encounter (HOSPITAL_COMMUNITY): Payer: Self-pay | Admitting: Hematology & Oncology

## 2019-11-22 ENCOUNTER — Inpatient Hospital Stay: Payer: Medicare Other | Admitting: Hematology & Oncology

## 2019-11-22 ENCOUNTER — Telehealth: Payer: Self-pay | Admitting: Internal Medicine

## 2019-11-23 ENCOUNTER — Ambulatory Visit: Payer: Medicare Other | Admitting: Radiation Oncology

## 2019-11-23 ENCOUNTER — Inpatient Hospital Stay: Payer: Medicare Other

## 2019-11-28 ENCOUNTER — Encounter (HOSPITAL_COMMUNITY): Payer: Self-pay | Admitting: Hematology & Oncology

## 2019-11-28 ENCOUNTER — Ambulatory Visit: Payer: Medicare Other | Admitting: Radiation Oncology

## 2019-11-29 ENCOUNTER — Ambulatory Visit: Payer: Medicare Other

## 2019-11-30 ENCOUNTER — Ambulatory Visit: Payer: Medicare Other

## 2019-12-03 ENCOUNTER — Ambulatory Visit: Payer: Medicare Other

## 2019-12-03 ENCOUNTER — Telehealth: Payer: Self-pay

## 2019-12-03 NOTE — Telephone Encounter (Signed)
Called and left VM on daughter's cell-phone to see how patient is doing, and if patient has made a decision about whether or not she would like to pursue radiation. Requested return call with update.

## 2019-12-04 ENCOUNTER — Encounter: Payer: Self-pay | Admitting: *Deleted

## 2019-12-04 ENCOUNTER — Ambulatory Visit: Payer: Medicare Other

## 2019-12-04 NOTE — Progress Notes (Signed)
Called and spoke to [place], patients daughter, to follow up on patient request to cancel all appointments for 2 weeks period to make decisions. At this time Tye Maryland believes the patient will schedule follow up and potential treatment, but she has not yet been given the okay to start scheduling appointments. She has my number and knows to call once patient decides to move forward, or if patient decides on no further assessment/treatment.

## 2019-12-24 ENCOUNTER — Encounter: Payer: Self-pay | Admitting: *Deleted

## 2019-12-24 NOTE — Progress Notes (Signed)
Patient's daughter, Stephanie Duran, reaching out because patient is having more symptoms. Patient had decided to cancel all appointments with medical and radiation oncology, but now is more symptomatic and would like to come in and discuss currently issues and possible assessment/treatment paths. She is having more pain, and periods of confusion with some loss of balance. She is considering making appointments to receive radiation therapy previously discussed with her.   Appointment with Dr Marin Olp made for 6/24 with Stephanie Duran.

## 2020-01-02 ENCOUNTER — Other Ambulatory Visit: Payer: Self-pay | Admitting: *Deleted

## 2020-01-02 DIAGNOSIS — M84452A Pathological fracture, left femur, initial encounter for fracture: Secondary | ICD-10-CM

## 2020-01-02 DIAGNOSIS — C73 Malignant neoplasm of thyroid gland: Secondary | ICD-10-CM

## 2020-01-03 ENCOUNTER — Inpatient Hospital Stay (HOSPITAL_BASED_OUTPATIENT_CLINIC_OR_DEPARTMENT_OTHER): Payer: Medicare Other | Admitting: Hematology & Oncology

## 2020-01-03 ENCOUNTER — Encounter: Payer: Self-pay | Admitting: Hematology & Oncology

## 2020-01-03 ENCOUNTER — Inpatient Hospital Stay: Payer: Medicare Other | Attending: Hematology & Oncology

## 2020-01-03 ENCOUNTER — Other Ambulatory Visit: Payer: Self-pay

## 2020-01-03 VITALS — BP 167/56 | HR 84 | Temp 97.1°F | Resp 19 | Ht 63.0 in | Wt 127.0 lb

## 2020-01-03 DIAGNOSIS — C7951 Secondary malignant neoplasm of bone: Secondary | ICD-10-CM

## 2020-01-03 DIAGNOSIS — C73 Malignant neoplasm of thyroid gland: Secondary | ICD-10-CM | POA: Diagnosis not present

## 2020-01-03 DIAGNOSIS — M84452A Pathological fracture, left femur, initial encounter for fracture: Secondary | ICD-10-CM

## 2020-01-03 DIAGNOSIS — F17210 Nicotine dependence, cigarettes, uncomplicated: Secondary | ICD-10-CM | POA: Diagnosis not present

## 2020-01-03 DIAGNOSIS — M25552 Pain in left hip: Secondary | ICD-10-CM | POA: Insufficient documentation

## 2020-01-03 DIAGNOSIS — C3412 Malignant neoplasm of upper lobe, left bronchus or lung: Secondary | ICD-10-CM | POA: Insufficient documentation

## 2020-01-03 LAB — CBC WITH DIFFERENTIAL (CANCER CENTER ONLY)
Abs Immature Granulocytes: 0.03 10*3/uL (ref 0.00–0.07)
Basophils Absolute: 0.1 10*3/uL (ref 0.0–0.1)
Basophils Relative: 1 %
Eosinophils Absolute: 0.3 10*3/uL (ref 0.0–0.5)
Eosinophils Relative: 3 %
HCT: 33.6 % — ABNORMAL LOW (ref 36.0–46.0)
Hemoglobin: 11.3 g/dL — ABNORMAL LOW (ref 12.0–15.0)
Immature Granulocytes: 0 %
Lymphocytes Relative: 23 %
Lymphs Abs: 2.2 10*3/uL (ref 0.7–4.0)
MCH: 31 pg (ref 26.0–34.0)
MCHC: 33.6 g/dL (ref 30.0–36.0)
MCV: 92.1 fL (ref 80.0–100.0)
Monocytes Absolute: 0.7 10*3/uL (ref 0.1–1.0)
Monocytes Relative: 7 %
Neutro Abs: 6.6 10*3/uL (ref 1.7–7.7)
Neutrophils Relative %: 66 %
Platelet Count: 312 10*3/uL (ref 150–400)
RBC: 3.65 MIL/uL — ABNORMAL LOW (ref 3.87–5.11)
RDW: 12.4 % (ref 11.5–15.5)
WBC Count: 9.9 10*3/uL (ref 4.0–10.5)
nRBC: 0 % (ref 0.0–0.2)

## 2020-01-03 LAB — CMP (CANCER CENTER ONLY)
ALT: 15 U/L (ref 0–44)
AST: 16 U/L (ref 15–41)
Albumin: 3.6 g/dL (ref 3.5–5.0)
Alkaline Phosphatase: 96 U/L (ref 38–126)
Anion gap: 10 (ref 5–15)
BUN: 34 mg/dL — ABNORMAL HIGH (ref 8–23)
CO2: 25 mmol/L (ref 22–32)
Calcium: 9.8 mg/dL (ref 8.9–10.3)
Chloride: 102 mmol/L (ref 98–111)
Creatinine: 1.09 mg/dL — ABNORMAL HIGH (ref 0.44–1.00)
GFR, Est AFR Am: 54 mL/min — ABNORMAL LOW (ref >60)
GFR, Estimated: 46 mL/min — ABNORMAL LOW (ref >60)
Glucose, Bld: 121 mg/dL — ABNORMAL HIGH (ref 70–99)
Potassium: 4.7 mmol/L (ref 3.5–5.1)
Sodium: 137 mmol/L (ref 135–145)
Total Bilirubin: 0.4 mg/dL (ref 0.3–1.2)
Total Protein: 7.4 g/dL (ref 6.5–8.1)

## 2020-01-03 NOTE — Progress Notes (Signed)
Hematology and Oncology Follow Up Visit  HELAYNE METSKER [id number] [birth year] 86 y.o. 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 [place] is back for second office visit.  We first saw her back in mid April.  At that time, there was some concern regarding a mass in the left lung.  On PET scan, this measured 3.2 x 3.4 cm.  We ultimately got a biopsy of this.  The biopsy was done on Nov 12, 2019.  The pathology report [facility] - [id number]) showed a poorly differentiated none small cell carcinoma.  This was a squamous cell carcinoma.  Unfortunately, there is no PD-L1 and no actionable mutations.  She says she would not want any treatments for the thyroid cancer nor the lung cancer.  However, she seems to be change her mind a little bit.  Unfortunately, she is still smoking.  She comes in with her daughter.  Her daughter has helped out quite a bit.  She has little more pain in the left hip.  She never had radiation therapy after she had surgery.  She did not want radiation therapy at the time.  She seems to be eating fairly well.  She has had no problems with bleeding.  There is no issues with bowels or bladder.  Currently, I would have to say that her performance status is probably ECOG 2.  Medications:  Current Outpatient Medications:  .  acetaminophen (TYLENOL) 500 MG tablet, Take 1,000 mg by mouth every 6 (six) hours as needed for moderate pain., Disp: , Rfl:  .  amitriptyline (ELAVIL) 10 MG tablet, Take 10 mg by mouth at bedtime. , Disp: , Rfl:  .  Cholecalciferol (VITAMIN D3) 50 MCG (2000 UT) TABS, Take 4,000 Units by mouth every evening. , Disp: , Rfl:  .  CINNAMON PO, Take by mouth., Disp: , Rfl:  .  docusate sodium (COLACE) 100 MG capsule, Take 100 mg by mouth  daily as needed for mild constipation., Disp: , Rfl:  .  HYDROcodone-acetaminophen (NORCO) 10-325 MG tablet, Take 1 tablet by mouth every 6 (six) hours as needed., Disp: 28 tablet, Rfl: 0 .  Iron-FA-B Cmp-C-Biot-Probiotic (FUSION PLUS) CAPS, Take 1 capsule by mouth daily. (Patient taking differently: Take 1 capsule by mouth every evening. ), Disp: 30 capsule, Rfl: 5 .  linaclotide (LINZESS) 72 MCG capsule, Take 72 mcg by mouth 2 (two) times a week. , Disp: , Rfl:  .  polyvinyl alcohol (LIQUIFILM TEARS) 1.4 % ophthalmic solution, Place 1 drop into both eyes as needed for dry eyes., Disp: , Rfl:  .  propranolol (INDERAL) 40 MG tablet, Take 40 mg by mouth 2 (two) times daily. , Disp: , Rfl:  .  sennosides-docusate sodium (SENOKOT-S) 8.6-50 MG tablet, Take 2 tablets by mouth daily., Disp: 30 tablet, Rfl: 1 .  SYMBICORT 160-4.5 MCG/ACT inhaler, Inhale 2 puffs into the lungs 2 (two) times daily as needed (shortness of breath). , Disp: , Rfl:  .  temazepam (RESTORIL) 15 MG capsule, Take 15 mg by mouth at bedtime., Disp: , Rfl:  .  vitamin A 3 MG (10000 UNITS) capsule, Take 10,000 Units by mouth daily. , Disp: , Rfl:   Allergies:  Allergies  Allergen Reactions  . Valium [Diazepam] Anxiety    Anxious/ " makes  me climb walls"    Past Medical History, Surgical history, Social history, and Family History were reviewed and updated.  Review of Systems: Review of Systems  Constitutional: Positive for fatigue.  HENT:  Negative.   Eyes: Negative.   Respiratory: Positive for shortness of breath and wheezing.   Cardiovascular: Negative.   Gastrointestinal: Negative.   Endocrine: Negative.   Genitourinary: Negative.    Musculoskeletal: Positive for arthralgias and myalgias.  Skin: Negative.   Neurological: Negative.   Hematological: Negative.   Psychiatric/Behavioral: Negative.     Physical Exam:  height is '5\' 3"'$  (1.6 m) and weight is 127 lb (57.6 kg). Her temporal temperature is 97.1 F (36.2 C)  (abnormal). Her blood pressure is 167/56 (abnormal) and her pulse is 84. Her respiration is 19 and oxygen saturation is 99%.   Wt Readings from Last 3 Encounters:  01/03/20 127 lb (57.6 kg)  11/12/19 135 lb (61.2 kg)  10/29/19 127 lb (57.6 kg)    Physical Exam Vitals reviewed.  HENT:     Head: Normocephalic and atraumatic.  Eyes:     Pupils: Pupils are equal, round, and reactive to light.  Cardiovascular:     Rate and Rhythm: Normal rate and regular rhythm.     Heart sounds: Normal heart sounds.  Pulmonary:     Effort: Pulmonary effort is normal.     Breath sounds: Normal breath sounds.  Abdominal:     General: Bowel sounds are normal.     Palpations: Abdomen is soft.  Musculoskeletal:        General: No tenderness or deformity. Normal range of motion.     Cervical back: Normal range of motion.  Lymphadenopathy:     Cervical: No cervical adenopathy.  Skin:    General: Skin is warm and dry.     Findings: No erythema or rash.  Neurological:     Mental Status: She is alert and oriented to person, place, and time.  Psychiatric:        Behavior: Behavior normal.        Thought Content: Thought content normal.        Judgment: Judgment normal.      Lab Results  Component Value Date   WBC 9.9 01/03/2020   HGB 11.3 (L) 01/03/2020   HCT 33.6 (L) 01/03/2020   MCV 92.1 01/03/2020   PLT 312 01/03/2020     Chemistry      Component Value Date/Time   NA 137 01/03/2020 1523   K 4.7 01/03/2020 1523   CL 102 01/03/2020 1523   CO2 25 01/03/2020 1523   BUN 34 (H) 01/03/2020 1523   CREATININE 1.09 (H) 01/03/2020 1523      Component Value Date/Time   CALCIUM 9.8 01/03/2020 1523   ALKPHOS 96 01/03/2020 1523   AST 16 01/03/2020 1523   ALT 15 01/03/2020 1523   BILITOT 0.4 01/03/2020 1523      Impression and Plan: Ms. Nolden is a very charming 86 year old YO female.  She accessed 2 separate primary cancers.  She has a metastatic thyroid cancer.  She also has a left lung  primary non-small cell lung cancer with possible right lung nodules.  Regardless of stages, our goal here is quality of life.  Ms. Glade is going to decide whether she wants any radiotherapy for the left hip.  I told her that if she decides on treatment, she probably is going to need to have some recent scans done.  I thought that maybe  for the lung cancer, stereotactic radiosurgery may not be a bad idea.  For the hip area, I think that 1 or 2 doses of radiation would be helpful.  I would think metastatic thyroid cancer should be quite amenable to external beam radiation.  Again, she will decide and will let us know what she wants to have done.  Her daughter, who has had cancer cell, is incredibly helpful and very supportive.  She will get back with [place] and let us know what her mom decides.  I probably spent about 40 minutes or so with Ms. Danko today.   Volanda Napoleon, MD 6/24/20215:43 PM

## 2020-01-04 ENCOUNTER — Telehealth: Payer: Self-pay | Admitting: Hematology & Oncology

## 2020-01-04 LAB — LACTATE DEHYDROGENASE: LDH: 160 U/L (ref 98–192)

## 2020-01-04 NOTE — Telephone Encounter (Signed)
NO LOS FOR 6/24

## 2020-01-11 ENCOUNTER — Telehealth: Payer: Self-pay | Admitting: Radiation Oncology

## 2020-01-11 ENCOUNTER — Encounter: Payer: Self-pay | Admitting: *Deleted

## 2020-01-11 NOTE — Progress Notes (Signed)
Received a call from patient's daughter, Tye Maryland. Patient has decided she would like to proceed with radiation therapy after speaking to Dr Marin Olp.  Message sent to Dr Isidore Moos and her RN to notify them of patient decision. Dr Marin Olp also updated.

## 2020-01-11 NOTE — Telephone Encounter (Signed)
LVM for Tye Maryland to confirm 7/7 appointments for Radiation Oncology and CT Simulation.

## 2020-01-15 NOTE — Progress Notes (Signed)
Patient presents today for follow-up to discuss starting radiation to left hip and left upper lung. She is scheduled for CT simulation after appointment with Dr. Isidore Moos  Pain: left hip pain with ambulation SOB: only with activity Cough: occasional productive cough. Sputum is clear to yellow Other issues/changes: patient feels she is more unstable/off balance. She used to take meclizine to manage, but her daughter states she is out of the prescription  Vitals:   01/16/20 0943  BP: (!) 159/71  Pulse: 98  Resp: 20  Temp: 98.2 F (36.8 C)  SpO2: 100%   Wt Readings from Last 3 Encounters:  01/16/20 128 lb (58.1 kg)  01/03/20 127 lb (57.6 kg)  11/12/19 135 lb (61.2 kg)

## 2020-01-16 ENCOUNTER — Ambulatory Visit
Admission: RE | Admit: 2020-01-16 | Discharge: 2020-01-16 | Disposition: A | Discharge status: Home / Self Care | Payer: Medicare Other | Source: Ambulatory Visit | Attending: Radiation Oncology | Admitting: Radiation Oncology

## 2020-01-16 ENCOUNTER — Ambulatory Visit: Payer: Medicare Other

## 2020-01-16 ENCOUNTER — Other Ambulatory Visit: Payer: Self-pay

## 2020-01-16 VITALS — BP 159/71 | HR 98 | Temp 98.2°F | Resp 20 | Ht 63.0 in | Wt 128.0 lb

## 2020-01-16 DIAGNOSIS — C7951 Secondary malignant neoplasm of bone: Secondary | ICD-10-CM | POA: Insufficient documentation

## 2020-01-16 DIAGNOSIS — C3412 Malignant neoplasm of upper lobe, left bronchus or lung: Secondary | ICD-10-CM | POA: Diagnosis not present

## 2020-01-16 DIAGNOSIS — Z79899 Other long term (current) drug therapy: Secondary | ICD-10-CM | POA: Insufficient documentation

## 2020-01-16 DIAGNOSIS — C73 Malignant neoplasm of thyroid gland: Secondary | ICD-10-CM | POA: Insufficient documentation

## 2020-01-16 DIAGNOSIS — Z510 Encounter for antineoplastic radiation therapy: Secondary | ICD-10-CM | POA: Diagnosis not present

## 2020-01-16 DIAGNOSIS — G893 Neoplasm related pain (acute) (chronic): Secondary | ICD-10-CM | POA: Diagnosis not present

## 2020-01-16 DIAGNOSIS — M84452A Pathological fracture, left femur, initial encounter for fracture: Secondary | ICD-10-CM | POA: Diagnosis not present

## 2020-01-18 ENCOUNTER — Encounter: Payer: Self-pay | Admitting: Radiation Oncology

## 2020-01-18 DIAGNOSIS — Z79899 Other long term (current) drug therapy: Secondary | ICD-10-CM | POA: Diagnosis not present

## 2020-01-18 DIAGNOSIS — C73 Malignant neoplasm of thyroid gland: Secondary | ICD-10-CM | POA: Diagnosis not present

## 2020-01-18 DIAGNOSIS — C7951 Secondary malignant neoplasm of bone: Secondary | ICD-10-CM | POA: Diagnosis not present

## 2020-01-18 DIAGNOSIS — M84452A Pathological fracture, left femur, initial encounter for fracture: Secondary | ICD-10-CM | POA: Diagnosis not present

## 2020-01-18 DIAGNOSIS — Z510 Encounter for antineoplastic radiation therapy: Secondary | ICD-10-CM | POA: Diagnosis not present

## 2020-01-18 DIAGNOSIS — C3412 Malignant neoplasm of upper lobe, left bronchus or lung: Secondary | ICD-10-CM | POA: Insufficient documentation

## 2020-01-18 NOTE — Progress Notes (Signed)
Radiation Oncology         [phone] ________________________________  Name: Stephanie Duran MRN: [id number]  Date: 01/16/2020  DOB: [birth year]  Follow-Up Visit Note  Outpatient  CC: Cyndi Bender, Desma Mcgregor, MD  Diagnosis and Prior Radiotherapy:    ICD-10-CM   1. Bone metastasis ([place])  C79.51   2. Malignant neoplasm of upper lobe, left bronchus or lung ([place])  C34.12     CHIEF COMPLAINT: I decided to get treatment  Narrative:  The patient returns today for routine follow-up.  Since consultation, after some thought, the patient decided that She would like to pursue palliative treatment.  She is here to discuss radiation to the left femur / hip and left upper lung. She is scheduled for CT simulation today.  She is here with her daughter.  It should be noted that on 2019-11-12 biopsy of the dominant left upper lobe lung tumor revealed poorly differentiated non-small cell carcinoma.  Findings are suggestive of poorly differentiated squamous cell carcinoma.  Carcinoma is negative for TTF-1, and thyroglobulin.  This is felt to be a separate primary that her thyroid cancer.   Pain: left hip pain with ambulation SOB: only with activity Cough: occasional productive cough. Sputum is clear to yellow.  No recent hemoptysis.    ALLERGIES:  is allergic to valium [diazepam].  Meds: Current Outpatient Medications  Medication Sig Dispense Refill  . acetaminophen (TYLENOL) 500 MG tablet Take 1,000 mg by mouth every 6 (six) hours as needed for moderate pain.    Marland Kitchen amitriptyline (ELAVIL) 10 MG tablet Take 10 mg by mouth at bedtime.     . Cholecalciferol (VITAMIN D3) 50 MCG (2000 UT) TABS Take 4,000 Units by mouth every evening.     Marland Kitchen CINNAMON PO Take by mouth.    . docusate sodium (COLACE) 100 MG capsule Take 100 mg by mouth daily as needed for mild constipation.    . Iron-FA-B Cmp-C-Biot-Probiotic (FUSION PLUS) CAPS Take 1 capsule by mouth daily. (Patient taking differently: Take 1  capsule by mouth every evening. ) 30 capsule 5  . linaclotide (LINZESS) 72 MCG capsule Take 72 mcg by mouth 2 (two) times a week.     . polyvinyl alcohol (LIQUIFILM TEARS) 1.4 % ophthalmic solution Place 1 drop into both eyes as needed for dry eyes.    Marland Kitchen propranolol (INDERAL) 40 MG tablet Take 40 mg by mouth 2 (two) times daily.     . sennosides-docusate sodium (SENOKOT-S) 8.6-50 MG tablet Take 2 tablets by mouth daily. 30 tablet 1  . SYMBICORT 160-4.5 MCG/ACT inhaler Inhale 2 puffs into the lungs 2 (two) times daily as needed (shortness of breath).     . temazepam (RESTORIL) 15 MG capsule Take 15 mg by mouth at bedtime.    . vitamin A 3 MG (10000 UNITS) capsule Take 10,000 Units by mouth daily.     Marland Kitchen HYDROcodone-acetaminophen (NORCO) 10-325 MG tablet Take 1 tablet by mouth every 6 (six) hours as needed. (Patient not taking: Reported on 01/16/2020) 28 tablet 0   No current facility-administered medications for this encounter.    Physical Findings: The patient is in no acute distress. Patient is alert and oriented.  height is 5\' 3"  (1.6 m) and weight is 128 lb (58.1 kg). Her oral temperature is 98.2 F (36.8 C). Her blood pressure is 159/71 (abnormal) and her pulse is 98. Her respiration is 20 and oxygen saturation is 100%. Marland Kitchen    ECOG 2  Lab  Findings: Lab Results  Component Value Date   WBC 9.9 01/03/2020   HGB 11.3 (L) 01/03/2020   HCT 33.6 (L) 01/03/2020   MCV 92.1 01/03/2020   PLT 312 01/03/2020    Radiographic Findings: No results found.  Impression/Plan: This is a lovely 86 year old woman with squamous cell carcinoma of the lung and metastatic thyroid cancer; today, we again talked to the patient about the findings and work-up thus far. We discussed the patient's diagnosis of metastatic thyroid cancer to the left hip, as well as a dominant left upper lung mass consistent with a separate primary, revealing squamous cell carcinoma on biopsy.  These are not her only known sites of  disease, she has metastatic disease evidenced on systemic imaging.  I recommend that we give postoperative treatment to the left hip and femur to prevent disease progression, as well as radiotherapy to the dominant cavitary lung tumor in the left upper lobe for local control.     She has reservations about multiple appointments, and she is relatively frail.  I think the most balanced approach for her would be a 5 fraction palliative regimen.  She is agreeable to this plan.  We again discussed the risks benefits and side effects of radiotherapy.  She may have some skin irritation in the regions of her treatment, as well as some fatigue.  Permanent serious injuries to the tissues in the chest and left lower extremity are very uncommon with this type of treatment.  Consent form has been signed.  We will proceed with simulation today and start treatment next week..  We discussed measures to reduce the risk of infection during the COVID-19 pandemic.  She has not received the Covid vaccine.  We discussed the risks benefits and side effects of the vaccine.  I strongly recommend that she receive it.  She does not plan to do at this time despite my recommendations and urging by her family.  She knows that she can contact me if she has further questions about vaccination in the future and I am happy to help her get scheduled for vaccine if she changes her mind.   On date of service, in total, I spent 25 minutes on this encounter. Patient was seen in person.    Eppie Gibson, MD

## 2020-01-21 ENCOUNTER — Encounter: Payer: Self-pay | Admitting: *Deleted

## 2020-01-21 NOTE — Progress Notes (Signed)
Patient has been seen by RadOnc and will begin palliative radiation on  01/23/20.  Oncology Nurse Navigator Documentation  Oncology Nurse Navigator Flowsheets 01/21/2020  Abnormal Finding Date -  Confirmed Diagnosis Date -  Diagnosis Status -  Planned Course of Treatment -  Phase of Treatment Radiation  Radiation Pending-Reason: -  Radiation Actual Start Date: 01/23/2020  Radiation Expected End Date: 01/29/2020  Navigator Follow Up Date: -  Navigator Follow Up Reason: -  Navigator Location [facility]  Referral Date to RadOnc/MedOnc -  Navigator Encounter Type Appt/Treatment Plan Review  Telephone -  Patient Visit Type -  Treatment Phase Pre-Tx/Tx Discussion  Barriers/Navigation Needs No Barriers At This Time  Education -  Interventions None Required  Acuity Level 2-Minimal Needs (1-2 Barriers Identified)  Referrals -  Coordination of Care -  Education Method -  Support Groups/Services Friends and Family  Time Spent with Patient 15

## 2020-01-23 ENCOUNTER — Ambulatory Visit
Admission: RE | Admit: 2020-01-23 | Discharge: 2020-01-23 | Disposition: A | Discharge status: Home / Self Care | Payer: Medicare Other | Source: Ambulatory Visit | Attending: Radiation Oncology | Admitting: Radiation Oncology

## 2020-01-23 ENCOUNTER — Other Ambulatory Visit: Payer: Self-pay

## 2020-01-23 DIAGNOSIS — Z79899 Other long term (current) drug therapy: Secondary | ICD-10-CM | POA: Diagnosis not present

## 2020-01-23 DIAGNOSIS — C3412 Malignant neoplasm of upper lobe, left bronchus or lung: Secondary | ICD-10-CM | POA: Diagnosis not present

## 2020-01-23 DIAGNOSIS — C7951 Secondary malignant neoplasm of bone: Secondary | ICD-10-CM | POA: Diagnosis not present

## 2020-01-23 DIAGNOSIS — M84452A Pathological fracture, left femur, initial encounter for fracture: Secondary | ICD-10-CM | POA: Diagnosis not present

## 2020-01-23 DIAGNOSIS — C73 Malignant neoplasm of thyroid gland: Secondary | ICD-10-CM | POA: Diagnosis not present

## 2020-01-23 DIAGNOSIS — Z510 Encounter for antineoplastic radiation therapy: Secondary | ICD-10-CM | POA: Diagnosis not present

## 2020-01-24 ENCOUNTER — Other Ambulatory Visit: Payer: Self-pay

## 2020-01-24 ENCOUNTER — Ambulatory Visit
Admission: RE | Admit: 2020-01-24 | Discharge: 2020-01-24 | Disposition: A | Discharge status: Home / Self Care | Payer: Medicare Other | Source: Ambulatory Visit | Attending: Radiation Oncology | Admitting: Radiation Oncology

## 2020-01-24 DIAGNOSIS — M84452A Pathological fracture, left femur, initial encounter for fracture: Secondary | ICD-10-CM | POA: Diagnosis not present

## 2020-01-24 DIAGNOSIS — C7951 Secondary malignant neoplasm of bone: Secondary | ICD-10-CM | POA: Diagnosis not present

## 2020-01-24 DIAGNOSIS — C73 Malignant neoplasm of thyroid gland: Secondary | ICD-10-CM | POA: Diagnosis not present

## 2020-01-24 DIAGNOSIS — Z79899 Other long term (current) drug therapy: Secondary | ICD-10-CM | POA: Diagnosis not present

## 2020-01-24 DIAGNOSIS — Z510 Encounter for antineoplastic radiation therapy: Secondary | ICD-10-CM | POA: Diagnosis not present

## 2020-01-24 DIAGNOSIS — C3412 Malignant neoplasm of upper lobe, left bronchus or lung: Secondary | ICD-10-CM | POA: Diagnosis not present

## 2020-01-25 ENCOUNTER — Ambulatory Visit
Admission: RE | Admit: 2020-01-25 | Discharge: 2020-01-25 | Disposition: A | Discharge status: Home / Self Care | Payer: Medicare Other | Source: Ambulatory Visit | Attending: Radiation Oncology | Admitting: Radiation Oncology

## 2020-01-25 DIAGNOSIS — C3412 Malignant neoplasm of upper lobe, left bronchus or lung: Secondary | ICD-10-CM | POA: Diagnosis not present

## 2020-01-25 DIAGNOSIS — Z79899 Other long term (current) drug therapy: Secondary | ICD-10-CM | POA: Diagnosis not present

## 2020-01-25 DIAGNOSIS — M84452A Pathological fracture, left femur, initial encounter for fracture: Secondary | ICD-10-CM | POA: Diagnosis not present

## 2020-01-25 DIAGNOSIS — C7951 Secondary malignant neoplasm of bone: Secondary | ICD-10-CM | POA: Diagnosis not present

## 2020-01-25 DIAGNOSIS — Z510 Encounter for antineoplastic radiation therapy: Secondary | ICD-10-CM | POA: Diagnosis not present

## 2020-01-25 DIAGNOSIS — C73 Malignant neoplasm of thyroid gland: Secondary | ICD-10-CM | POA: Diagnosis not present

## 2020-01-28 ENCOUNTER — Other Ambulatory Visit: Payer: Self-pay

## 2020-01-28 ENCOUNTER — Ambulatory Visit
Admission: RE | Admit: 2020-01-28 | Discharge: 2020-01-28 | Disposition: A | Discharge status: Home / Self Care | Payer: Medicare Other | Source: Ambulatory Visit | Attending: Radiation Oncology | Admitting: Radiation Oncology

## 2020-01-28 DIAGNOSIS — M84452A Pathological fracture, left femur, initial encounter for fracture: Secondary | ICD-10-CM | POA: Diagnosis not present

## 2020-01-28 DIAGNOSIS — C3412 Malignant neoplasm of upper lobe, left bronchus or lung: Secondary | ICD-10-CM | POA: Diagnosis not present

## 2020-01-28 DIAGNOSIS — Z510 Encounter for antineoplastic radiation therapy: Secondary | ICD-10-CM | POA: Diagnosis not present

## 2020-01-28 DIAGNOSIS — C73 Malignant neoplasm of thyroid gland: Secondary | ICD-10-CM | POA: Diagnosis not present

## 2020-01-28 DIAGNOSIS — Z79899 Other long term (current) drug therapy: Secondary | ICD-10-CM | POA: Diagnosis not present

## 2020-01-28 DIAGNOSIS — C7951 Secondary malignant neoplasm of bone: Secondary | ICD-10-CM | POA: Diagnosis not present

## 2020-01-29 ENCOUNTER — Other Ambulatory Visit: Payer: Self-pay

## 2020-01-29 ENCOUNTER — Ambulatory Visit
Admission: RE | Admit: 2020-01-29 | Discharge: 2020-01-29 | Disposition: A | Discharge status: Home / Self Care | Payer: Medicare Other | Source: Ambulatory Visit | Attending: Radiation Oncology | Admitting: Radiation Oncology

## 2020-01-29 ENCOUNTER — Encounter: Payer: Self-pay | Admitting: Radiation Oncology

## 2020-01-29 ENCOUNTER — Other Ambulatory Visit: Payer: Self-pay | Admitting: Radiation Oncology

## 2020-01-29 DIAGNOSIS — C73 Malignant neoplasm of thyroid gland: Secondary | ICD-10-CM | POA: Diagnosis not present

## 2020-01-29 DIAGNOSIS — Z510 Encounter for antineoplastic radiation therapy: Secondary | ICD-10-CM | POA: Diagnosis not present

## 2020-01-29 DIAGNOSIS — C7951 Secondary malignant neoplasm of bone: Secondary | ICD-10-CM | POA: Diagnosis not present

## 2020-01-29 DIAGNOSIS — C3412 Malignant neoplasm of upper lobe, left bronchus or lung: Secondary | ICD-10-CM | POA: Diagnosis not present

## 2020-01-29 DIAGNOSIS — Z79899 Other long term (current) drug therapy: Secondary | ICD-10-CM | POA: Diagnosis not present

## 2020-01-29 DIAGNOSIS — M84452A Pathological fracture, left femur, initial encounter for fracture: Secondary | ICD-10-CM | POA: Diagnosis not present

## 2020-01-29 LAB — URINALYSIS, COMPLETE (UACMP) WITH MICROSCOPIC
Bilirubin Urine: NEGATIVE
Glucose, UA: NEGATIVE mg/dL
Hgb urine dipstick: NEGATIVE
Ketones, ur: NEGATIVE mg/dL
Nitrite: POSITIVE — AB
Protein, ur: NEGATIVE mg/dL
Specific Gravity, Urine: 1.012 (ref 1.005–1.030)
pH: 5 (ref 5.0–8.0)

## 2020-01-30 ENCOUNTER — Telehealth: Payer: Self-pay

## 2020-01-30 ENCOUNTER — Other Ambulatory Visit: Payer: Self-pay | Admitting: Radiation Oncology

## 2020-01-30 MED ORDER — CEFPODOXIME PROXETIL 100 MG PO TABS: 100.0000 mg | ORAL_TABLET | Freq: Two times a day (BID) | ORAL | 0 refills | Status: AC

## 2020-01-30 NOTE — Telephone Encounter (Signed)
Patient's daughter, Juliann Pulse called and advised that her mother has a UTI and Dr. Sondra Come will be sending a script in for an abx to [facility] [place] later this afternoon. Daughter verbalized understanding.

## 2020-01-31 ENCOUNTER — Telehealth: Payer: Self-pay

## 2020-01-31 LAB — URINE CULTURE: Culture: >=100000 — AB

## 2020-01-31 NOTE — Telephone Encounter (Signed)
5p 01/30/2020  Pt's daughter called requesting a less expensive abx than Vantin for her mother's UTI. TC to pharmacist at [facility] [place] that the alternatives for this med per Dr. Sondra Come have too many side effects and the family will need to go ahead and fill the rx. Pharmacist states he will advise the family of this.

## 2020-02-12 ENCOUNTER — Telehealth: Payer: Self-pay | Admitting: *Deleted

## 2020-02-12 ENCOUNTER — Other Ambulatory Visit: Payer: Self-pay | Admitting: *Deleted

## 2020-02-12 MED ORDER — MECLIZINE HCL 25 MG PO TABS: 25.0000 mg | ORAL_TABLET | Freq: Three times a day (TID) | ORAL | 0 refills | Status: AC | PRN

## 2020-02-12 MED ORDER — ONDANSETRON HCL 4 MG PO TABS: 4.0000 mg | ORAL_TABLET | Freq: Three times a day (TID) | ORAL | 1 refills | Status: AC | PRN

## 2020-02-12 NOTE — Telephone Encounter (Signed)
Call placed back to [facility] to inform her that Dr. Marin Olp is going to send in a prescription for Meclizine for pt.'s dizziness.  Cathy appreciative of call and has no further questions at this time.

## 2020-02-12 NOTE — Telephone Encounter (Signed)
Message received from patient's daughter, Tye Maryland to inform Dr. Marin Olp that pt has refused to go to the ED and that Tye Maryland would like to have palliative care ordered for pt. Dr. Marin Olp notified.  Call placed back to [facility] and [place] notified per order of Dr. Marin Olp that palliative care consult would be sent in and that Dr. Marin Olp would like pt to come in for visit tomorrow.  Tye Maryland states that she does not want to bring pt in at this time, but is appreciative of Dr. Antonieta Pert assistance.  Tye Maryland would like to know if Dr. Marin Olp feels as if Meclizine would help pt.'s dizziness.  Informed Tye Maryland that I would speak with Dr. Marin Olp and call her back with orders.

## 2020-02-12 NOTE — Telephone Encounter (Signed)
Call received from patient's daughter Tye Maryland stating that pt is weak, dizzy, pale, having nausea and vomiting and her gait is off.  Dr. Marin Olp notified.  Call placed back to [facility] notified per order of Dr. Marin Olp to take pt to the [facility] ER now.  Tye Maryland states that she will take pt to the [facility] ER now and is appreciative of assistance.

## 2020-02-15 ENCOUNTER — Telehealth: Payer: Self-pay | Admitting: Adult Health Nurse Practitioner

## 2020-02-15 NOTE — Telephone Encounter (Signed)
Rec'd return call from daughter, Stephanie Duran and after discussing Palliative services she was in agreement with this.  I have scheduled an In-person Consult for 02/20/20 @ 2:30 PM.

## 2020-02-15 NOTE — Telephone Encounter (Signed)
Called daughter to schedule a Palliative Consult for the patient, no answer - left message with reason for call along with my name and contact number

## 2020-02-19 ENCOUNTER — Other Ambulatory Visit: Payer: Self-pay | Admitting: Family

## 2020-02-20 ENCOUNTER — Other Ambulatory Visit: Payer: Self-pay

## 2020-02-20 ENCOUNTER — Other Ambulatory Visit: Payer: Medicare Other | Admitting: Adult Health Nurse Practitioner

## 2020-02-20 DIAGNOSIS — Z515 Encounter for palliative care: Secondary | ICD-10-CM

## 2020-02-20 DIAGNOSIS — C3412 Malignant neoplasm of upper lobe, left bronchus or lung: Secondary | ICD-10-CM

## 2020-02-20 NOTE — Progress Notes (Signed)
[facility] Pall[facility] Care Consult Note Telephone: [phone]  Fax: [phone]  PATIENT NAME: Stephanie Duran DOB: [birth year] MRN: [id number]  PRIMARY CARE PROVIDER:   Cyndi Duran, Stephanie Duran  REFERRING PROVIDER:  Cyndi Bender, PA-C [place],  [place] [address]  RESPONSIBLE PARTY:     ASSESSMENT:        RECOMMENDATIONS and PLAN:  1.  Advanced care planning.  Patient is currently full code.  Have left MOST form and encouraged family to review this together.  Encouraged to call with any questions or to write questions down to go over at next visit.  2.  Functional status.  Patient states that when she first gets up from sitting that she steadies herself with her walker until the pain eases in her hips and back.  Then she states that she can walk without a walker.  Patient is independent of ADLs though she does endorse that it is hard for her to wash her back.  She is continent of bowel and bladder.  Is able to do some light housecleaning.  Patient does have sitters that come in from 9 AM to 1 PM and then again from 7 PM to 7 AM.  Continue supportive care at home  3.  Nutritional status.  Patient has good appetite with no reported weight loss.  Current weight is 127 pounds with BMI of 22.5.  4.  Pain.  Patient does get pain more so in her left hip that goes down into her thigh.  Does have hydrocodone/APAP 10/325 mg that she takes as needed.  States that she only takes this half a tab once in a while.  The last time she had half a tablet earlier this week.  Otherwise she just takes Tylenol with adequate relief.  Continue current pain regimen Patient does state that she has been getting some pain in her epigastric region after eating.  Discussed using Prilosec or Pepcid to see if this helps relieve the pain.  5.  Constipation.  Patient has had chronic constipation and has tried several laxatives and stool softeners.  Currently takes Linzess 72 mcg as  needed.  Patient endorses that she has 1 bowel movement every 7 to 10 days.  Discussed trying to take the Linzess every Monday, Wednesday, Friday to see if this gives her better relief.  Palliative will continue to monitor for symptom management/decline and make recommendations as needed.  Next appointment is in 4 weeks.  Encouraged to call with any questions or concerns  I spent 90 minutes providing this consultation,  from 2:30 to 4:00 including time spent with patient/family, chart review, provider coordination, documentation. More than 50% of the time in this consultation was spent coordinating communication.   HISTORY OF PRESENT ILLNESS:  Stephanie Duran is a 86 y.o. year old female with multiple medical problems including thyroid cancer with mets to bone, small cell lung cancer, HTN, COPD. Palliative Care was asked to help address goals of care.  Patient had hip replacement to left hip on 10/08/2019 due to pathological fracture.  Patient has recurrent UTIs was in urgent care on 11/18/2019 and treated for UTI.  Daughter states that within the past 5 weeks she was treated for another UTI.  Patient has just recently finished radiation therapy for bone lesion on left hip and nodules on lungs.  Patient does not want to pursue any other treatment.  Wants to focus on quality of life at home.  Family is  interested in hospice services once appropriate.  Denies fever, chest pain, palpitations, increased shortness of breath or cough, N/V/D, dysuria, hematuria.  CODE STATUS: See above  PPS: 50% HOSPICE ELIGIBILITY/DIAGNOSIS: TBD  PHYSICAL EXAM:  BP 128/70 HR 81 O2 96% on room air General: NAD, frail appearing, thin Cardiovascular: regular rate and rhythm Pulmonary: Lung sounds clear on left side, reduced air movement noticed to right lung; normal respiratory effort Abdomen: soft, nontender, + bowel sounds GU: no suprapubic tenderness Extremities: no edema, no joint deformities Skin: no rashes on exposed  skin Neurological: Weakness but otherwise nonfocal   PAST MEDICAL HISTORY:  Past Medical History:  Diagnosis Date  . Borderline systolic HTN   . COPD (chronic obstructive pulmonary disease) ([place])    per patient's daughter Stephanie Duran  . Hypertension   . Pneumonia     SOCIAL HX:  Social History   Tobacco Use  . Smoking status: Current Every Day Smoker    Packs/day: 1.00    Years: 15.00    Pack years: 15.00    Types: Cigarettes  . Smokeless tobacco: Never Used  . Tobacco comment: has audible wheezing and SOB with extertion.  Substance Use Topics  . Alcohol use: Not on file    ALLERGIES:  Allergies  Allergen Reactions  . Valium [Diazepam] Anxiety    Anxious/ " makes me climb walls"     PERTINENT MEDICATIONS:  Outpatient Encounter Medications as of 02/20/2020  Medication Sig  . acetaminophen (TYLENOL) 500 MG tablet Take 1,000 mg by mouth every 6 (six) hours as needed for moderate pain.  Marland Kitchen amitriptyline (ELAVIL) 10 MG tablet Take 10 mg by mouth at bedtime.   . cefpodoxime (VANTIN) 100 MG tablet Take 1 tablet (100 mg total) by mouth 2 (two) times daily.  . Cholecalciferol (VITAMIN D3) 50 MCG (2000 UT) TABS Take 4,000 Units by mouth every evening.   Marland Kitchen CINNAMON PO Take by mouth.  . docusate sodium (COLACE) 100 MG capsule Take 100 mg by mouth daily as needed for mild constipation.  Marland Kitchen HYDROcodone-acetaminophen (NORCO) 10-325 MG tablet Take 1 tablet by mouth every 6 (six) hours as needed. (Patient not taking: Reported on 01/16/2020)  . Iron-FA-B Cmp-C-Biot-Probiotic (FUSION PLUS) CAPS Take 1 capsule by mouth daily. (Patient taking differently: Take 1 capsule by mouth every evening. )  . linaclotide (LINZESS) 72 MCG capsule Take 72 mcg by mouth 2 (two) times a week.   . meclizine (ANTIVERT) 25 MG tablet Take 1 tablet (25 mg total) by mouth 3 (three) times daily as needed for dizziness.  . ondansetron (ZOFRAN) 4 MG tablet Take 1 tablet (4 mg total) by mouth every 8 (eight) hours as needed  for nausea or vomiting.  . polyvinyl alcohol (LIQUIFILM TEARS) 1.4 % ophthalmic solution Place 1 drop into both eyes as needed for dry eyes.  Marland Kitchen propranolol (INDERAL) 40 MG tablet Take 40 mg by mouth 2 (two) times daily.   . sennosides-docusate sodium (SENOKOT-S) 8.6-50 MG tablet Take 2 tablets by mouth daily.  . SYMBICORT 160-4.5 MCG/ACT inhaler Inhale 2 puffs into the lungs 2 (two) times daily as needed (shortness of breath).   . temazepam (RESTORIL) 15 MG capsule Take 15 mg by mouth at bedtime.  . vitamin A 3 MG (10000 UNITS) capsule Take 10,000 Units by mouth daily.    No facility-administered encounter medications on file as of 02/20/2020.     Admiral Marcucci Jenetta Downer, NP

## 2020-02-25 ENCOUNTER — Encounter: Payer: Self-pay | Admitting: *Deleted

## 2020-02-25 ENCOUNTER — Emergency Department
Admission: EM | Admit: 2020-02-25 | Discharge: 2020-02-25 | Disposition: A | Discharge status: Home / Self Care | Payer: Medicare Other | Attending: Emergency Medicine | Admitting: Emergency Medicine

## 2020-02-25 ENCOUNTER — Emergency Department: Payer: Medicare Other

## 2020-02-25 DIAGNOSIS — W010XXA Fall on same level from slipping, tripping and stumbling without subsequent striking against object, initial encounter: Secondary | ICD-10-CM | POA: Insufficient documentation

## 2020-02-25 DIAGNOSIS — S022XXA Fracture of nasal bones, initial encounter for closed fracture: Secondary | ICD-10-CM

## 2020-02-25 MED ORDER — AMOXICILLIN-POT CLAVULANATE 875-125 MG PO TABS
1.0000 | ORAL_TABLET | Freq: Once | ORAL | Status: AC
Start: 2020-02-25 — End: 2020-02-25
  Administered 2020-02-25: 1 via ORAL
  Filled 2020-02-25: qty 1

## 2020-02-25 MED ORDER — ACETAMINOPHEN 325 MG PO TABS
650.0000 mg | ORAL_TABLET | Freq: Once | ORAL | Status: AC
Start: 2020-02-25 — End: 2020-02-25
  Administered 2020-02-25: 650 mg via ORAL
  Filled 2020-02-25: qty 2

## 2020-02-25 MED ORDER — TETANUS-DIPHTH-ACELL PERTUSSIS 5-2.5-18.5 LF-MCG/0.5 IM SUSP
0.5000 mL | Freq: Once | INTRAMUSCULAR | Status: AC
Start: 2020-02-25 — End: 2020-02-25
  Administered 2020-02-25: 0.5 mL via INTRAMUSCULAR
  Filled 2020-02-25: qty 0.5

## 2020-02-25 MED ORDER — AMOXICILLIN-POT CLAVULANATE 875-125 MG PO TABS
1.0000 | ORAL_TABLET | Freq: Two times a day (BID) | ORAL | 0 refills | Status: AC
Start: 2020-02-25 — End: 2020-03-03

## 2020-02-25 NOTE — Progress Notes (Signed)
Received a call from daughter, Tye Maryland. She dropped off FMLA paperwork and now requests that we mail the completed paperwork back to her. Her address is:  [address]  [place] [address]   Message sent to Otilio Carpen, [facility] specialist requesting that the forms be mailed.

## 2020-02-25 NOTE — ED Provider Notes (Signed)
EMERGENCY DEPARTMENT HISTORY AND PHYSICAL EXAM     None        Date: 02/25/2020  Patient Name: Cynthia Kim    History of Presenting Illness and Plan     Chief Complaint   Patient presents with   . Fall   . Facial Injury       History Provided By: pt  Chief Complaint: facial injury  Duration: minutes  Timing:  acute  Location: nose  Quality: uncomfortable  Severity: moderate    Associated symptoms and pertinent negative listed in ROS.    PCP: Hayden Pedro, MD  SPECIALISTS:    No current facility-administered medications for this encounter.     Current Outpatient Medications   Medication Sig Dispense Refill   . amoxicillin-clavulanate (AUGMENTIN) 875-125 MG per tablet Take 1 tablet by mouth 2 (two) times daily for 7 days 14 tablet 0   . aspirin EC 325 MG EC tablet Take 325 mg by mouth daily.     . Azelastine-Fluticasone (DYMISTA NA) by Nasal route.     . Cholecalciferol (VITAMIN D) 1000 UNIT tablet Take 1,000 Units by mouth daily.     . digoxin (LANOXIN) 0.125 MG tablet Take 1 tablet (0.125 mg total) by mouth daily. 30 tablet 0   . meclizine (ANTIVERT) 12.5 MG tablet Take 25 mg by mouth 3 (three) times daily as needed.     . metoprolol (TOPROL-XL) 100 MG 24 hr tablet Take 1 tablet (100 mg total) by mouth daily.     . naproxen (NAPROSYN) 250 MG tablet Take 1 tablet (250 mg total) by mouth 2 (two) times daily with meals. 10 tablet 0   . sertraline (ZOLOFT) 25 MG tablet Take 25 mg by mouth daily.         Past History     Past Medical History:  Past Medical History:   Diagnosis Date   . A-fib    . Arthritis    . Atrial fibrillation        Past Surgical History:  Past Surgical History:   Procedure Laterality Date   . HYSTERECTOMY         Family History:  History reviewed. No pertinent family history.    Social History:  Social History     Tobacco Use   . Smoking status: Never Smoker   Substance Use Topics   . Alcohol use: No   . Drug use: No       Allergies:  Allergies   Allergen Reactions   . Norco  [Hydrocodone-Acetaminophen] Other (See Comments)     Pt reports dizziness within 2 hours of taking; taken with small meal.    . Other Other (See Comments)     Mosquito bite causes her to have blisters.       Review of Systems     Review of Systems   Gastrointestinal: Negative for vomiting.   Skin: Positive for wound.   Neurological: Negative for headaches.       Physical Exam   BP 160/77   Pulse 93   Temp 97.6 F (36.4 C) (Oral)   Resp 19   Ht 5\' 3"  (1.6 m)   Wt 49.9 kg   SpO2 98%   BMI 19.49 kg/m     Physical Exam  Vitals and nursing note reviewed.   Constitutional:       Appearance: She is well-developed.   HENT:      Head: Normocephalic.      Comments: Abrasion  to nose  Eyes:      Conjunctiva/sclera: Conjunctivae normal.   Cardiovascular:      Rate and Rhythm: Normal rate and regular rhythm.   Pulmonary:      Effort: Pulmonary effort is normal. No respiratory distress.      Breath sounds: Normal breath sounds. No wheezing.   Abdominal:      Palpations: Abdomen is soft.      Tenderness: There is no abdominal tenderness.   Musculoskeletal:         General: No deformity.      Cervical back: Neck supple.      Comments: Bilateral UE radial pulses intact, no focal tenderness, joints with FROM  Bilateral LE DP pulses intact, no focal tenderness, joints with FROM  Nasal bridge edema, no septal hematoma   Skin:     General: Skin is warm.   Neurological:      Mental Status: She is alert.         Diagnostic Study Results     Labs -     Results     ** No results found for the last 24 hours. **          Radiologic Studies -   Radiology Results (24 Hour)     Procedure Component Value Units Date/Time    CT Maxillofacial Bones [[id number]] Collected: 02/25/20 1605    Order Status: Completed Updated: 02/25/20 1611    Narrative:      CT MAXILLOFACIAL BONES  CLINICAL INDICATION: inj    COMPARISON: None available    TECHNIQUE:  Helical CT scan through the sinuses, orbits and facial bones  was performed in the axial plane  without intravenous contrast. Coronal  reformatted images were reconstructed from the thin-section data set.   Note that CT scanning at this site  utilizes multiple dose reduction  techniques including automatic exposure control, adjustment of the MAS  and/or KVP according to patient's size and use of iterative  reconstruction technique      FINDINGS:  A comminuted and impacted nasal bone fracture present. The  nasal septum is tortuous and the axial images nondisplaced fracture  suspected.   The coronal reconstructions demonstrate the orbital floor to be intact.         Impression:      Comminuted and impacted nasal bone fracture. Tortuous nasal  septum with probable nondisplaced fracture.    Heron Nay, MD   02/25/2020 4:09 PM    CT Head WO Contrast [[id number]] Collected: 02/25/20 1602    Order Status: Completed Updated: 02/25/20 1607    Narrative:      CT HEAD WO CONTRAST  CLINICAL INDICATION: inj    COMPARISON: 01/16/2016    TECHNIQUE:   Imaging was performed from the skull base to the vertex without contrast  administration.  Note: Note that CT scanning at this site  utilizes multiple dose  reduction techniques including automatic exposure control, adjustment of  the MAS and/or KVP according to patient's size and use of iterative  reconstruction technique      FINDINGS:   Small area of right scalp soft tissue swelling noted.  The ventricles  are normal in size and contour with respect to the  patient's age. Multiple   lucencies are noted in the subcortical and  periventricular white matter bilaterally. No hemorrhage, mass effect or  other acute change is noted      Impression:         No hemorrhage or acute intracranial  abnormality. Stable chronic changes    Heron Nay, MD   02/25/2020 4:05 PM      .    Medical Decision Making   I am the first provider for this patient.    I reviewed the vital signs, available nursing notes, past medical history, past surgical history, family history and social  history.    Old Medical Records:     Vital Signs-Reviewed the patient's vital signs.     Patient Vitals for the past 12 hrs:   BP Temp Pulse Resp   02/25/20 1600 160/77 -- 93 19   02/25/20 1350 -- 97.6 F (36.4 C) -- --   02/25/20 1348 142/88 -- 89 16       Pulse Oximetry Analysis - Normal       Procedures:    ED Course:         At discharge patient feels better. I have discussed all testing results and plan of care with patient. All questions solicited and addressed. Possibility of evolving illness reviewed. Patient agrees with going home, following up and returning if worsening symptoms.        Provider Note: 86 year old female past history of A. fib not on anticoagulation presents with trip and fall onto face, no LOC or vomiting, tetanus to be updated, denies pain elsewhere, no chest pain or shortness of breath, vital signs stable, exam as above.  Plan is for CT head and face and Tylenol for disposition.     Dr. Suzy Bouchard is the primary emergency doctor of record.            Diagnosis     Clinical Impression:   1. Closed fracture of nasal bone, initial encounter        Treatment Plan:   ED Disposition     ED Disposition Condition Date/Time Comment    Discharge  Mon Feb 25, 2020  4:18 PM Roddie Mc discharge to home/self care.    Condition at disposition: Stable              This note was generated by the Epic EMR system/ Dragon speech recognition and may contain inherent errors or omissions not intended by the user. Grammatical errors, random word insertions, deletions and pronoun errors  are occasional consequences of this technology due to software limitations. Not all errors are caught or corrected. If there are questions or concerns about the content of this note or information contained within the body of this dictation they should be addressed directly with the author for clarification.  _______________________________       Suzy Bouchard, MD  02/25/20 2021

## 2020-02-25 NOTE — ED Triage Notes (Signed)
88 YOF BIBA from home for nose injury following a fall. PT reports she was taking groceries inside and tripped on a shoe, hitting her nose on a saw-horse. Pt is AAOx4, No LOC. Ice pack applied to laceration by medic, bleeding controlled. PMH a-fib, pt reports she does not take blood thinners, takes digoxin and ASA.

## 2020-02-25 NOTE — ED Notes (Signed)
Bed: GR8  Expected date: 02/25/20  Expected time: 1:34 PM  Means of arrival: [id number] [place]  Comments:  [facility] 409

## 2020-02-25 NOTE — Discharge Instructions (Signed)
Nasal Fracture    You have been diagnosed with a nasal fracture (broken nose).    Usually, a doctor can diagnose a broken nose by examination only. An x-ray is rarely needed.    Often after an injury, the nose gets very swollen and painful. Whether or not the nasal bone is broken, if the nose looks "out of place" or crooked, a cosmetic (plastic) surgeon may need to repair it. If the nose is broken but not crooked or "out of place," no special treatment is needed. It can take at least 2 weeks for nose swelling to go down enough to tell if the nose needs surgery to make it straight.    Sometimes the nasal bone must be repositioned (moved). This is often done later by a specialist. The doctor here or your regular doctor may refer you to a specialist for follow-up.    You will have some swelling and bruising over the next several weeks. Placing an ice bag over the injured area may keep swelling down. Put some ice cubes in a re-sealable (Ziploc) bag and add some water. Put a thin washcloth between the bag and the skin. Apply the ice bag to the area for at least 20 minutes. Do this at least 4 times per day. Longer times and more often are OK. NEVER APPLY ICE DIRECTLY TO THE SKIN.    Avoid sneezing or blowing your nose too hard. This may cause swelling of the face or a bloody nose.    If your nose bleeds, pinch the entire nose for 20 minutes. If your nose is still bleeding after squeezing it for 20 minutes, seek medical attention.    Putting ice on the forehead or the back of the neck WILL NOT stop a bloody nose.    YOU SHOULD SEEK MEDICAL ATTENTION IMMEDIATELY, EITHER HERE OR AT THE NEAREST EMERGENCY DEPARTMENT, IF ANY OF THE FOLLOWING OCCURS:   Bleeding doesn't stop with direct pressure.   Headaches or double vision develop.   Any signs of a head injury develop. These include confusion or lethargy (overly sleepy or trouble waking from sleep).

## 2020-02-27 ENCOUNTER — Telehealth: Payer: Self-pay

## 2020-02-27 NOTE — Telephone Encounter (Signed)
Called and spoke with patient's daughter Stephanie Duran to see how patient was doing since completing radiation treatment, and if patient was interested in keeping F/U appointment on Friday. Daughter stated that patient did not want to come into the clinic on Friday, and was comfortable with her daughter just giving me an update on the phone. Daughter stated that patient is still in quite a bit of pain in her left hip, but does not want to seek out orthopedic consultation. Patient had a sever episode of vertigo about 2 week ago, but refused to go to the ED. Per daughter patient stated "I'm sick inside and dying. I don't want to sit in the hospital for hours for them to just poke and prod me. There is nothing they can do there that can help me." Daughter and granddaughter were eventually able to stabilize patient, and she hasn't had another episode since. I inquired if palliative team have been helpful, and daughter replied that they don't seem to be able to help the patient like the family thought. Stephanie Duran wanted to know if Dr. Hilma Favors was still available/able to help. Daughter stated that patient seems to want to stay at home and be left alone, but is a constant pain and uncomfortable. Informed daughter that I would reach out to both Dr. Marin Olp and Dr. Hilma Favors to see what other resources are available to ensure patient's wishes are honored and comfort provided. Informed daughter that I would cancel upcoming radiation F/U but that she could always reach back to [place] should she have any further questions/needs. Daughter verbalized appreciation of call and denied any further concerns at this time.

## 2020-02-28 ENCOUNTER — Other Ambulatory Visit: Payer: Self-pay | Admitting: Internal Medicine

## 2020-02-28 DIAGNOSIS — Z515 Encounter for palliative care: Secondary | ICD-10-CM

## 2020-02-28 MED ORDER — NITROFURANTOIN MONOHYD MACRO 100 MG PO CAPS
100.0000 mg | ORAL_CAPSULE | Freq: Every day | ORAL | 3 refills | Status: AC
Start: 2020-02-28 — End: ?

## 2020-02-28 MED ORDER — HYDROCODONE-ACETAMINOPHEN 10-325 MG PO TABS: 1.0000 | ORAL_TABLET | ORAL | 0 refills | Status: AC | PRN

## 2020-02-28 NOTE — Progress Notes (Signed)
Phone discussion with patients daughter re: in home care needs and challenges providing care for her mother. I have recommended hospice services. In the meantime, I have also prescribed prophylactic Macrobid for UTI- this will reduce the need to obtain UAs etc in mangaing frequent UTI and all of the issues that come along with that including behavior such as  Delirium and dehydration poor appetite. Patient refuses to go to a doctor or hospital at this point - but also wants the keys to her car. She is having severe pain in her thight and back related to bone mets from her lung cancer.  Recommendations: 1. Hospice referral- her daughter will get back to me on this soon 2. Macrobid PO QHS 3. Refilled 7 days of hydromorphone until hospice can see her.  Lane Hacker, DO Palliative Medicine

## 2020-02-29 ENCOUNTER — Ambulatory Visit: Payer: Self-pay | Admitting: Radiation Oncology

## 2020-03-03 DIAGNOSIS — I10 Essential (primary) hypertension: Secondary | ICD-10-CM | POA: Diagnosis not present

## 2020-03-03 DIAGNOSIS — H26492 Other secondary cataract, left eye: Secondary | ICD-10-CM | POA: Diagnosis not present

## 2020-03-03 DIAGNOSIS — Z961 Presence of intraocular lens: Secondary | ICD-10-CM | POA: Diagnosis not present

## 2020-03-05 ENCOUNTER — Encounter (INDEPENDENT_AMBULATORY_CARE_PROVIDER_SITE_OTHER): Payer: Self-pay | Admitting: Cardiovascular Disease

## 2020-03-05 ENCOUNTER — Ambulatory Visit (INDEPENDENT_AMBULATORY_CARE_PROVIDER_SITE_OTHER): Payer: Medicare Other | Admitting: Cardiovascular Disease

## 2020-03-05 VITALS — BP 144/88 | HR 78 | Wt 110.0 lb

## 2020-03-05 DIAGNOSIS — I4821 Permanent atrial fibrillation: Secondary | ICD-10-CM

## 2020-03-05 NOTE — Progress Notes (Signed)
[place] HEART CARDIOLOGY OFFICE PROGRESS NOTE    HRT [facility] OFFICE       HEART [facility] OFFICE -CARDIOLOGY  [address]  [place] [address]  Dept: [phone]  Dept Fax: [phone]       Patient Name: Cynthia Kim    Date of Visit:  March 05, 2020  Date of Birth: [birth year]  AGE: 86 y.o.  Medical Record #: [id number]  Requesting Physician: Hayden Pedro, MD      CHIEF COMPLAINT: Pre-op Exam      HISTORY OF PRESENT ILLNESS:    She is a pleasant 86 y.o. female who presents today for preop cardiovascular evaluation.  She tripped and fell resulting in facial injury and needs no surgery.  She was last seen by Dr. Georgeanna Lea almost a year ago.  Since then she has had no cardiac problem other than permanent atrial fibrillation.  She is physically "very active".  She does gardening 3 to 4 hours every other day, she does all sorts of house chores without any cardiac symptom.      PAST MEDICAL HISTORY: She has a past medical history of A-fib, Arthritis, and Atrial fibrillation. She has a past surgical history that includes Hysterectomy.    ALLERGIES:   Allergies   Allergen Reactions   . Norco [Hydrocodone-Acetaminophen] Other (See Comments)     Pt reports dizziness within 2 hours of taking; taken with small meal.    . Other Other (See Comments)     Mosquito bite causes her to have blisters.       MEDICATIONS:   Current Outpatient Medications   Medication Sig   . aspirin EC 325 MG EC tablet Take 325 mg by mouth daily.   . Azelastine-Fluticasone (DYMISTA NA) by Nasal route.   . Cholecalciferol (VITAMIN D) 1000 UNIT tablet Take 1,000 Units by mouth daily.   . digoxin (LANOXIN) 0.125 MG tablet Take 1 tablet (0.125 mg total) by mouth daily.   . meclizine (ANTIVERT) 12.5 MG tablet Take 25 mg by mouth 3 (three) times daily as needed.   . metoprolol (TOPROL-XL) 100 MG 24 hr tablet Take 1 tablet (100 mg total) by mouth daily.   . naproxen (NAPROSYN) 250 MG tablet Take 1 tablet (250 mg total) by mouth 2  (two) times daily with meals.   . sertraline (ZOLOFT) 25 MG tablet Take 25 mg by mouth daily.        FAMILY HISTORY: family history is not on file.    SOCIAL HISTORY: She reports that she has never smoked. She does not have any smokeless tobacco history on file. She reports that she does not drink alcohol and does not use drugs.    PHYSICAL EXAMINATION    Visit Vitals  BP 170/50 (BP Site: Right arm, Patient Position: Sitting)   Pulse 78   Wt 49.9 kg (110 lb)   BMI 19.49 kg/m       General Appearance: Thin in no acute distress.    Skin: Warm and dry.  Head: Normocephalic, normal hair pattern   Eyes: PER, conjunctivae and lids unremarkable.  ENT: Facial bruise around the nose.  No pallor or cyanosis.     Neck: JVP normal, no carotid bruit   Chest: Clear to auscultation   Cardiovascular: Irregular rhythm, S1 normal, S2 normal, No S3 a systolic murmur is present  Abdomen: Soft, nontender  Extremities: Warm without edema.  Varicose veins.  Arthritic changes of fingers  Neuro: affect appropriate.  ECG: Atrial fibrillation with controlled rate      IMPRESSION:   Cynthia Kim is a 86 y.o. female with the following problems:    1. Permanent atrial fibrillation with controlled rate.  Personal choice not to take anticoagulant.  On full dose aspirin  2. Recent mechanical fall with facial injury, needs no surgery  3. Echo 2018: Normal EF BAE borderline pulmonary hypertension of 36      RECOMMENDATIONS:    1.  She has excellent functional status no further cardiac testing prior to the surgery.  Her revised cardiac risk index is 0 or about 3.9% risk  2.  She is already holding aspirin for the surgery, resume once safely to do so after surgery  3.  Echocardiogram in about a month, follow-up afterwards with Dr. Georgeanna Lea                                                     Orders Placed This Encounter   Procedures   . ECG 12 lead       SIGNED:    Lonzo Cloud, MD          This note was generated by the Dragon speech recognition  and may contain errors or omissions not intended by the user. Grammatical errors, random word insertions, deletions, pronoun errors, and incomplete sentences are occasional consequences of this technology due to software limitations. Not all errors are caught or corrected. If there are questions or concerns about the content of this note or information contained within the body of this dictation, they should be addressed directly with the author for clarification.

## 2020-03-06 DIAGNOSIS — H26492 Other secondary cataract, left eye: Secondary | ICD-10-CM | POA: Diagnosis not present

## 2020-03-11 ENCOUNTER — Telehealth: Payer: Self-pay | Admitting: Adult Health Nurse Practitioner

## 2020-03-11 NOTE — Telephone Encounter (Signed)
Rec'd call from daughter stating they wanted to cancel Palliative services, they feel that patient needs additional help and she said they are going with [facility].  Will notify NP and MD

## 2020-03-11 NOTE — Progress Notes (Signed)
[facility]: Stephanie Duran  Referral received for hospice care at home from Dr. Rhea Pink.   We have reached out to the daughter and left VM for return call to schedule an appointment and confirm interest in hospice care at home.   She has been approved by our MD for hospice care at home if in agreement.   Webb Silversmith RN [phone]

## 2020-03-12 NOTE — Progress Notes (Signed)
[facility]  0830am No return call from daughter yesterday Harlene Salts. I have reached out again this am to leave VM for return call to confirm interest in Hospice services at home and schedule intake visit. Will continue to follow or update when more info available. Webb Silversmith RN [phone]

## 2020-03-18 NOTE — Progress Notes (Signed)
[facility]:  W have made multiple attempts to reach the daughter, Harlene Salts,  leaving multiple voice mail messages and have continued to get no return calls. We have also tried to reach out to the son, Cathryne Mancebo, as well with no success. We have tried every phone number listed on face sheet in Epic.    We are closing out this referral. If the pt and family decides to reach out to [place] we will contact Dr. Hilma Favors for new orders. If the family decides to reach out to MD for hospice care. Please re-referral pt.    Thank you Webb Silversmith RN [phone]

## 2020-03-18 NOTE — Progress Notes (Unsigned)
[facility]  Pt daughter Dorna Bloom has called today to express that she has had some health concerns and has not been able to return our calls but does confirm interest in hospice are for pt and has requested [place] meet for initial visit and enrollment into the services for This Friday at 200pm.    She is requesting that our providers be attending due to no longer following up with oncology for additional appointments.   Webb Silversmith RN [phone]

## 2020-03-21 NOTE — Progress Notes (Signed)
  Patient Name: Stephanie Duran MRN: [id number] DOB: March 12, 1932 Referring Physician: Cyndi Bender (Profile Not Attached) Date of Service: 01/29/2020 [facility], [place]                                                        End Of Treatment Note  Diagnoses: C34.12-Malignant neoplasm of upper lobe, left bronchus or lung C34.92-Malignant neoplasm of unspecified part of left bronchus or lung C79.51-Secondary malignant neoplasm of bone M84.452A-Pathological fracture, left femur, initial encounter for fracture  Cancer Staging:  STAGE IV  Intent: Palliative  Radiation Treatment Dates: 01/23/2020 through 01/29/2020 Site Technique Total Dose (Gy) Dose per Fx (Gy) Completed Fx Beam Energies  Hip, Left: Pelvis_Lt_hip Complex 20/20 4 5/5 10X, 15X  Lung, Left: Lung_Lt_upper 3D 25/25 5 5/5 6X, 10X   Narrative: The patient tolerated radiation therapy relatively well.   Plan: The patient will follow-up with radiation oncology in 45mo, or as needed. -----------------------------------  Eppie Gibson, MD

## 2020-03-24 ENCOUNTER — Telehealth: Payer: Self-pay | Admitting: *Deleted

## 2020-03-24 NOTE — Telephone Encounter (Signed)
Message received from patient's daughter Juliann Pulse to inform Dr. Marin Olp that patient passed away yesterday.  Dr. Marin Olp notified.

## 2020-04-07 ENCOUNTER — Ambulatory Visit
Admission: RE | Admit: 2020-04-07 | Discharge: 2020-04-07 | Disposition: A | Discharge status: Home / Self Care | Payer: Medicare Other | Source: Ambulatory Visit | Attending: Cardiovascular Disease | Admitting: Cardiovascular Disease

## 2020-04-07 DIAGNOSIS — I088 Other rheumatic multiple valve diseases: Secondary | ICD-10-CM | POA: Insufficient documentation

## 2020-04-07 DIAGNOSIS — I4821 Permanent atrial fibrillation: Secondary | ICD-10-CM

## 2020-04-07 DIAGNOSIS — I272 Pulmonary hypertension, unspecified: Secondary | ICD-10-CM | POA: Insufficient documentation

## 2020-04-07 DIAGNOSIS — I517 Cardiomegaly: Secondary | ICD-10-CM | POA: Insufficient documentation

## 2020-04-07 DIAGNOSIS — R55 Syncope and collapse: Secondary | ICD-10-CM | POA: Insufficient documentation

## 2020-04-10 ENCOUNTER — Telehealth (INDEPENDENT_AMBULATORY_CARE_PROVIDER_SITE_OTHER): Payer: Self-pay

## 2020-04-10 ENCOUNTER — Encounter (INDEPENDENT_AMBULATORY_CARE_PROVIDER_SITE_OTHER): Payer: Self-pay | Admitting: Cardiovascular Disease

## 2020-04-10 NOTE — Telephone Encounter (Signed)
SWP re: SPRs recommendation about following up with JJ. Patient verbalized understanding. Requested a call back later today. Will forward to AO front desk.

## 2020-04-10 NOTE — Telephone Encounter (Signed)
L/m to sch annual w/ JJ

## 2020-04-10 NOTE — Telephone Encounter (Signed)
-----   Message from Montey Hora, MD sent at 04/10/2020  1:59 AM EDT -----  Needs f/u ov post echocardiogram with Dr. Georgeanna Lea.  I sent her a message on my chart.  Please arrange.  Thanks.  SPR

## 2020-04-11 DEATH — deceased

## 2020-04-15 ENCOUNTER — Telehealth (INDEPENDENT_AMBULATORY_CARE_PROVIDER_SITE_OTHER): Payer: Self-pay

## 2020-04-15 NOTE — Telephone Encounter (Signed)
Left message for pt to call back re: echo as per Dr. Letitia Neri.  Pt instructed to call [phone] and speak with any nurse.    "Cynthia Kim,   I reviewed your echocardiogram done on 9/27 and there have been some changes compared to the previous echocardiogram from 20128. Please schedule a follow up visit with Dr. Georgeanna Lea to review and so he can make further recommendations."

## 2020-04-15 NOTE — Telephone Encounter (Signed)
Patient called back for echo results. Relayed message about echo changes since last echo. Patient has scheduled appt with JJ on 11/8. Transferred to scheduling to see about an earlier appt.

## 2020-05-13 ENCOUNTER — Encounter (INDEPENDENT_AMBULATORY_CARE_PROVIDER_SITE_OTHER): Payer: Self-pay | Admitting: Cardiovascular Disease

## 2020-05-19 ENCOUNTER — Encounter (INDEPENDENT_AMBULATORY_CARE_PROVIDER_SITE_OTHER): Payer: Self-pay | Admitting: Cardiovascular Disease

## 2020-05-19 ENCOUNTER — Ambulatory Visit (INDEPENDENT_AMBULATORY_CARE_PROVIDER_SITE_OTHER): Payer: Medicare Other | Admitting: Cardiovascular Disease

## 2020-05-19 VITALS — BP 130/80 | HR 90 | Resp 14 | Ht 64.0 in | Wt 111.0 lb

## 2020-05-19 DIAGNOSIS — I4821 Permanent atrial fibrillation: Secondary | ICD-10-CM

## 2020-05-19 DIAGNOSIS — I4811 Longstanding persistent atrial fibrillation: Secondary | ICD-10-CM

## 2020-05-19 NOTE — Addendum Note (Signed)
Addended byGeorgeanna Lea,  A on: 05/19/2020 05:10 PM     Modules accepted: Orders

## 2020-05-19 NOTE — Progress Notes (Addendum)
[place] HEART CARDIOLOGY OFFICE PROGRESS NOTE    HRT [facility] OFFICE -CARDIOLOGY  [address]  [place] [address]  Dept: [phone]  Dept Fax: [phone]       Patient Name: Cynthia Kim    Date of Visit:  May 19, 2020  Date of Birth: February 16, 1932  AGE: 86 y.o.  Medical Record #: [id number]  Requesting Physician: Hayden Pedro, MD      CHIEF COMPLAINT: Atrial Fibrillation      HISTORY OF PRESENT ILLNESS:    She is a pleasant 86 y.o. female who presents today for follow-up.  She has a history of persistent atrial fibrillation personal choice not to pursue anticoagulation.  She recently had a mechanical fall and fractured her nose that was repaired by Dr. Shaune Pollack.  She had an echocardiogram that showed normal LV function biatrial enlargement and moderate pulmonary hypertension biatrial enlargement was increased compared to prior echocardiogram pulmonary hypertension had also increased to moderate from mild.  She is active without any chest pain or shortness of breath she is deferred definitive treatment for atrial fibrillation or further evaluation of her valvular abnormalities in the past.    PAST MEDICAL HISTORY: She has a past medical history of A-fib, Arthritis, and Atrial fibrillation. She has a past surgical history that includes Hysterectomy.    ALLERGIES:   Allergies   Allergen Reactions   . Amoxicillin Diarrhea   . Norco [Hydrocodone-Acetaminophen] Other (See Comments)     Pt reports dizziness within 2 hours of taking; taken with small meal.    . Other Other (See Comments)     Mosquito bite causes her to have blisters.       MEDICATIONS:   Current Outpatient Medications   Medication Sig   . aspirin EC 325 MG EC tablet Take 325 mg by mouth daily.   . Azelastine-Fluticasone (DYMISTA NA) by Nasal route.   . Cholecalciferol (VITAMIN D) 1000 UNIT tablet Take 1,000 Units by mouth daily.   . digoxin (LANOXIN) 0.125 MG tablet Take 1 tablet (0.125 mg total) by  mouth daily.   . metoprolol (TOPROL-XL) 100 MG 24 hr tablet Take 1 tablet (100 mg total) by mouth daily.   . sertraline (ZOLOFT) 25 MG tablet Take 25 mg by mouth daily.   . naproxen (NAPROSYN) 250 MG tablet Take 1 tablet (250 mg total) by mouth 2 (two) times daily with meals.        FAMILY HISTORY: family history is not on file.    SOCIAL HISTORY: She reports that she has never smoked. She has never used smokeless tobacco. She reports that she does not drink alcohol and does not use drugs.    PHYSICAL EXAMINATION    Visit Vitals  BP 130/80 (BP Site: Right arm, Patient Position: Sitting, Cuff Size: Medium)   Pulse 90   Resp 14   Ht 1.626 m (5\' 4" )   Wt 50.3 kg (111 lb)   BMI 19.05 kg/m       General Appearance:  A well-appearing female in no acute distress.    Skin: Warm and dry to touch, no apparent skin lesions, or masses noted.  Head: Normocephalic, normal hair pattern, no masses or tenderness   Eyes: EOMS Intact, PERRL, conjunctivae and lids unremarkable.  ENT: Ears, Nose and throat reveal no gross abnormalities.  No pallor or cyanosis.  Dentition good.   Neck: JVP normal, no carotid bruit, thyroid not enlarged   Chest: Clear to auscultation  bilaterally with good air movement and respiratory effort and no wheezes, rales, or rhonchi   Cardiovascular: Irregular 106 systolic murmur fourth left intercostal space  Abdomen: Soft, nontender, nondistended, with normoactive bowel sounds. No organomegaly.  No pulsatile masses, or bruits.   Extremities: Warm without edema. No clubbing, or cyanosis. All peripheral pulses are full and equal.   Neuro: Alert and oriented x3. No gross motor or sensory deficits noted, affect appropriate.        ECG:        LABS:   Lab Results   Component Value Date    WBC 7.30 01/17/2016    HGB 12.1 01/17/2016    HCT 36.8 (L) 01/17/2016    PLT 344 01/17/2016     Lab Results   Component Value Date    GLU 85 01/17/2016    BUN 13.0 01/17/2016    CREAT 0.6 01/17/2016    NA 140 01/17/2016    K 4.1  01/17/2016    CL 105 01/17/2016    CO2 25 01/17/2016    AST 22 01/16/2016    ALT 15 01/16/2016     Lab Results   Component Value Date    TSH 5.92 (H) 01/17/2016     Lab Results   Component Value Date    CHOL 187 03/20/2007    TRIG 134 03/20/2007    HDL 59 (L) 03/20/2007    LDL 101 03/20/2007             Most recent echo and nuclear study reviewed.      IMPRESSION:   Cynthia Kim is a 86 y.o. female with the following problems:    1. Chronic atrial fibrillation personal choice not to take anticoagulation or pursue rhythm management strategy reiterated on this visit  2. Biatrial enlargement and moderate pulmonary hypertension increase compared to prior echocardiogram with preserved LV function no symptoms appears compensated on exam discussed further evaluation and she defers in the absence of symptoms  3. Recent mechanical fall with nose fracture repaired by Dr. Shaune Pollack in no persistent abnormality      RECOMMENDATIONS:    Continue metoprolol digoxin aspirin.  Patient reiterates that she does not wish to take anticoagulation or pursue aggressive evaluation and wishes to continue rate control strategy.  After patient left I was not confident that she fully understood the results of her echocardiogram I therefore called she and her husband to review them again.  Given the increase in tricuspid regurgitation and pulmonary hypertension could consider an evaluation at the structural heart clinic versus continued observation.  Could also consider evaluation for causes of pulmonary pretension including chronic pulmonary embolism connective tissue disease etc. although she is asymptomatic so pulmonary embolism is highly unlikely  Check comprehensive metabolic panel digoxin level  If she and her husband decided they would like to pursue a more aggressive evaluation they will contact the and I will arrange appropriate follow-up.  Alternatively I will see her again   in 6 months we will discuss follow-up echocardiography at that  time                                                     Orders Placed This Encounter   Procedures   . Office Visit (HRT [place])       No orders of the defined types  were placed in this encounter.      SIGNED:    Tarri Glenn, MD          This note was generated by the Dragon speech recognition and may contain errors or omissions not intended by the user. Grammatical errors, random word insertions, deletions, pronoun errors, and incomplete sentences are occasional consequences of this technology due to software limitations. Not all errors are caught or corrected. If there are questions or concerns about the content of this note or information contained within the body of this dictation, they should be addressed directly with the author for clarification.

## 2020-05-20 ENCOUNTER — Telehealth (INDEPENDENT_AMBULATORY_CARE_PROVIDER_SITE_OTHER): Payer: Self-pay

## 2020-05-20 NOTE — Telephone Encounter (Signed)
-----   Message from Lysle Rubens, MD sent at 05/19/2020  4:49 PM EST -----  Please call patient and have her get a comprehensive metabolic panel and a digoxin level.  Orders in chartThanks

## 2020-05-20 NOTE — Telephone Encounter (Signed)
Per Dr. Georgeanna Lea, Message left for Pt. To have CMP, Dig level. Order for the lab is in Pt. Chart, Pt. Call if she wants the lab order to be mailed to her home.

## 2020-07-08 ENCOUNTER — Other Ambulatory Visit (INDEPENDENT_AMBULATORY_CARE_PROVIDER_SITE_OTHER): Payer: Self-pay | Admitting: Cardiovascular Disease

## 2020-07-14 ENCOUNTER — Telehealth (INDEPENDENT_AMBULATORY_CARE_PROVIDER_SITE_OTHER): Payer: Self-pay

## 2020-07-14 MED ORDER — DIGOXIN 125 MCG PO TABS
125.0000 ug | ORAL_TABLET | Freq: Every day | ORAL | 0 refills | Status: DC
Start: 2020-07-14 — End: 2020-10-22

## 2020-07-14 NOTE — Telephone Encounter (Signed)
Received patient husband for refill request for digoxin 0.125mg  po qd. Advised husband that patient needs to get bloodwork done as ordered by JJ at last OV. Will fax lab order to [facility] on [address] [phone]. Husband states that patient will get bloodwork done on Friday. Refill sent as requested. Patient updated via mychart

## 2020-07-23 ENCOUNTER — Inpatient Hospital Stay: Payer: Medicare Other | Admitting: Rehabilitative and Restorative Service Providers"

## 2020-07-25 ENCOUNTER — Inpatient Hospital Stay: Payer: Medicare Other | Admitting: Rehabilitative and Restorative Service Providers"

## 2020-07-25 NOTE — Progress Notes (Deleted)
Name:Cynthia Kim Age: 87 y.o.   Date of Service: 07/25/2020  Referring Physician:     Date of Injury: No data was found  Date Care Plan Established/Reviewed: No data was found  Date Treatment Started: No data was found  End of Certification Date: No data was found  Sessions in Plan of Care: No data was found  Surgery Date: No data was found      Visit Count: Visit count could not be calculated. Make sure you are using a visit which is associated with an episode.   Diagnosis: No diagnosis found.    Subjective     History of Present Illness   Functional Limitations (PLOF): ***    Outcome Measure   Tool Used/Details: FOTO      Precautions: No data was found  Allergies: Amoxicillin, Norco [hydrocodone-acetaminophen], and Other    Past Medical History:   Diagnosis Date   . A-fib    . Arthritis    . Atrial fibrillation        Objective     Integumentary   no wound, lesion or rash noted    Neurological Testing     Sensation     Shoulder   Left Shoulder   Intact: light touch    Right Shoulder   Intact: light touch              Cervical Spine ROM  Cervical Spine  AROM   Initial Eval           Flexion          Extension          Rotation R          Rotation L          SB R          SB L          Seated thoracic rotation            Upper Extremity ROM  Right  Initial Eval  AROM Right  Initial Eval  PROM R   R   UE ROM Left  Initial Eval  AROM Left  Initial Eval  PROM L   L       Shoulder flex           Shoulder abd        Functional ER:    Shoulder ER Functional ER:      Functional IR:    Shoulder IR Functional IR:          Elbow flex           Elbow ext           Wrist flex           Wrist ext                                          Upper Extremity MMT  /5  Right  Initial Eval R R R MMT  /5 Left  Initial Eval L L L       Shoulder flex           Shoulder abd           Elbow flex           Elbow ext           Shoulder ER  Shoulder IR           Wrist flex           Wrist ext                                             Treatment     Therapeutic Exercises - Justified to address any of the following: develop strength, endurance, ROM and/or flexibility.   Education on Dx, Prognosis, POC, anatomy. Pt consented to POC           Assessment   Akaisha is a 87 y.o. female presenting with *** who requires Physical Therapy for the following:  Impairments:   Pain that limits and interferes with functional ability.   Impaired postural alignment.  Decreased range of motion of shoulder  Decreased strength of the scapular musculature   Decreased scapular stabilizer strength  Decreased joint mobility of the GH, AC, SC  Decreased soft tissue mobility of the shoulder and cervical spine  Impaired scapulohumeral rhythm   Decreased/impaired motor control overhead reaching/lifting     Pain located: *** Shoulder    Clinical presentation: {Clinical Presentation:46537}  Barriers to therapy: {IPTC BARRIERS TO [name]:60454}    Functional Limitations (PLOF): ***  Prognosis: excellent  Plan   Visits per week: 2  Number of Sessions: 16  Direct One on One  09811: Therapeutic Exercise: To Develop Strength and Endurance, ROM and Flexibility  [phone]: Neuromuscular Reeducation (Proprioceptive Neuromuscular Faciliation)  97140: Manual Therapy techniques (mobilization, manipulation, manual traction) (Grade I-V to glenohumeral joint, AC joint, SC Joint, Shoulder Girdle, cervical spine, thoracic spine, and regionally interdependent joints, soft tissue mobilization, instrument assisted soft tissue mobilization.)  97530: Therapeutic Activities: Dynamic activities to improve functional performance  Dry Needling  Supervised Modalities  97010: Thermal modalities: hot/cold packs  29562: Mechnical traction  97014: Electrical stimulation  97016: Vasopneumatic devices  Plan for next session: ***          Reginal Lutes, DPT

## 2020-07-31 ENCOUNTER — Inpatient Hospital Stay: Payer: Medicare Other | Attending: Anesthesiology | Admitting: Rehabilitative and Restorative Service Providers"

## 2020-07-31 ENCOUNTER — Encounter: Payer: Self-pay | Admitting: Rehabilitative and Restorative Service Providers"

## 2020-07-31 VITALS — BP 135/73 | HR 71

## 2020-07-31 DIAGNOSIS — M25511 Pain in right shoulder: Secondary | ICD-10-CM | POA: Insufficient documentation

## 2020-07-31 DIAGNOSIS — M25611 Stiffness of right shoulder, not elsewhere classified: Secondary | ICD-10-CM | POA: Insufficient documentation

## 2020-07-31 NOTE — Progress Notes (Signed)
Name:Cynthia Kim Age: 87 y.o.   Date of Service: 07/31/2020  Referring Physician: Cathie Beams, MD   Date of Injury: 06/30/2020  Date Care Plan Established/Reviewed: 07/31/2020  Date Treatment Started: 07/31/2020  End of Certification Date: 10/28/2020  Sessions in Plan of Care: 16  Surgery Date: No data was found      Visit Count: 1   Diagnosis:   1. Acute pain of right shoulder    2. Stiffness of right shoulder joint        Subjective     History of Present Illness   History of Present Illness: Patient states for the last 3 years she has been having right shoulder pain. States the pain has been getting worse since then. States she is having trouble raising her shoulder up. States she has tried voltarin and pills but nothing has been helping. States there are times when she moves the shoulder she will get extreme pain. States she has tried doing exercises, heat, and cold but nothing seems to help. Denies N/T. States she did have a fall on August but landed on her other side and nose.     Worse: shoulder flex, abd, lifting  Pain: Sharp pain. Comes and goes  PMHx: A-Fib    Goals: Be able to work in the yard  Functional Limitations (PLOF): Difficulty reaching behind back for self hygiene and dressing tasks. (Able to reach fully behind back and complete all ADLs before onset of symptoms)  Difficulty reaching behind head for self care ADLs and personal hygiene. (Able to reach behind head and perform ADLs before symptom onset)  Difficulty reaching overhead for object manipulation. (Able to reach fully overhead before onset of symptoms)      Outcome Measure   Tool Used/Details: FOTO  Score: 41  Predicted Functional Outcome: 54    Social Support/Occupation  Lives in: multiple level home  Lives with: spouse  Occupation: Retired     Pt brought back 10 mins late to scheduled appt d/t completing new pat process limiting evaluation time.         Precautions: No data was found  Allergies: Amoxicillin, Norco  [hydrocodone-acetaminophen], and Other    Past Medical History:   Diagnosis Date   . A-fib    . Arthritis    . Atrial fibrillation        Objective     Posture     Head  Forward.    Shoulders  Rounded.    Integumentary   no wound, lesion or rash noted  07/31/2020 RS  Right shoulder depressed    Tests     Right Shoulder   Positive sulcus sign.     Neurological Testing     Sensation     Shoulder   Left Shoulder   Intact: light touch    Right Shoulder   Intact: light touch      BP: 135/73 Heart Rate: 71     Cervical Spine ROM  Cervical Spine  AROM   Initial Eval           Flexion   30       Extension   65       Rotation R   50       Rotation L   50                                   Upper Extremity  ROM  Right  Initial Eval  AROM Right  Initial Eval  PROM R   R   UE ROM Left  Initial Eval  AROM Left  Initial Eval  PROM L   L   90 100 P!   Shoulder flex 150 160     75 80 P!   Shoulder abd  175      Functional ER:C5    Shoulder ER Functional ER:T4      Functional IR:T10    Shoulder IR Functional IR:T8          Elbow flex           Elbow ext           Wrist flex           Wrist ext                                          Upper Extremity MMT  /5  Right  Initial Eval R R R MMT  /5 Left  Initial Eval L L L   3    Shoulder flex 4+      3    Shoulder abd 4+      4    Elbow flex 5      4+    Elbow ext 5      2+    Shoulder ER 4+      3    Shoulder IR 4+          Wrist flex           Wrist ext                                          08/01/2020 RS   Drop arm test (+)      Treatment     Therapeutic Exercises - Justified to address any of the following: develop strength, endurance, ROM and/or flexibility.   Education on Dx, Prognosis, POC, anatomy. Pt consented to POC      Manual Therapy - Justified to address any of the following:  Mobilization of joints and soft tissues, manipulation, manual lymphatic drainage, and/or manual traction.    STM to right UT, LS    AC joint mob grade 3 inf    A-P joint mob to G-H joint grade 3       ---       ---   Total Time    Timed Minutes 13 minutes   Untimed Minutes 17 minutes   Total Time 30 minutes        Assessment   Cynthia Kim is a 87 y.o. female presenting with right shoulder arthritis with possible underlying RTC tear who requires Physical Therapy for the following:  Impairments:   Pain that limits and interferes with functional ability.   Impaired postural alignment.  Decreased range of motion of shoulder  Decreased strength of the scapular musculature   Decreased scapular stabilizer strength  Decreased joint mobility of the GH, AC, SC  Decreased soft tissue mobility of the shoulder and cervical spine  Impaired scapulohumeral rhythm   Decreased/impaired motor control overhead reaching/lifting     Pain located: right Shoulder    Clinical presentation: stable - local pain without causing other issues/symptoms  Barriers to therapy:  Patient's age - delayed healing    Functional Limitations (PLOF): Difficulty reaching behind back for self hygiene and dressing tasks. (Able to reach fully behind back and complete all ADLs before onset of symptoms)  Difficulty reaching behind head for self care ADLs and personal hygiene. (Able to reach behind head and perform ADLs before symptom onset)  Difficulty reaching overhead for object manipulation. (Able to reach fully overhead before onset of symptoms)    Prognosis: good  Plan   Visits per week: 2  Number of Sessions: 16  Direct One on One  16109: Therapeutic Exercise: To Develop Strength and Endurance, ROM and Flexibility  [phone]: Neuromuscular Reeducation (Proprioceptive Neuromuscular Faciliation)  97140: Manual Therapy techniques (mobilization, manipulation, manual traction) (Grade I-V to glenohumeral joint, AC joint, SC Joint, Shoulder Girdle, cervical spine, thoracic spine, and regionally interdependent joints, soft tissue mobilization, instrument assisted soft tissue mobilization.)  97530: Therapeutic Activities: Dynamic activities to improve functional performance  Dry  Needling  Supervised Modalities  97010: Thermal modalities: hot/cold packs  09811: Mechnical traction  97014: Electrical stimulation  97016: Vasopneumatic devices  Plan for next session: PD,AE, pivot prone, RTC isometrics, postural exercises, scap stability       Goals    Goal 1: Pt will be able to demo HEP with zero to min cuing with good form to allow for independent performance at home and return to PLOF     Sessions: 16      Goal 2: Pt will score >/=58 /100 on FOTO FS Primary measure demonstrating improved functional mobility for return to PLOF     Sessions: 16      Goal 3: Patient will demonstrate shoulder flexion AROM of greater than or equal to 120 degrees to allow patient to lift objects into overhead cabinets without pain       Sessions: 16      Goal 4: Patient will demonstrate functional ER to T4 to allow patient to wash and comb hair without excessive effort       Sessions: 16                                Reginal Lutes, DPT

## 2020-07-31 NOTE — Telephone Encounter (Signed)
Pt's husband called and stated they want to go to different [facility] location than [address] for labs but they do not have the fax number. Advised will email the lab orders to pt's address so that they can print it and take it to lab. He v/u. Lab orders emailed securely to address on file, verified by husband.

## 2020-08-01 ENCOUNTER — Other Ambulatory Visit: Payer: Self-pay | Admitting: Cardiovascular Disease

## 2020-08-01 ENCOUNTER — Other Ambulatory Visit (FREE_STANDING_LABORATORY_FACILITY): Payer: Medicare Other

## 2020-08-01 DIAGNOSIS — I4821 Permanent atrial fibrillation: Secondary | ICD-10-CM

## 2020-08-01 DIAGNOSIS — M25511 Pain in right shoulder: Secondary | ICD-10-CM | POA: Insufficient documentation

## 2020-08-01 DIAGNOSIS — M25611 Stiffness of right shoulder, not elsewhere classified: Secondary | ICD-10-CM | POA: Insufficient documentation

## 2020-08-01 LAB — COMPREHENSIVE METABOLIC PANEL
ALT: 12 U/L (ref 0–55)
AST (SGOT): 15 U/L (ref 5–34)
Albumin/Globulin Ratio: 1.2 (ref 0.9–2.2)
Albumin: 4 g/dL (ref 3.5–5.0)
Alkaline Phosphatase: 85 U/L (ref 37–117)
Anion Gap: 6 (ref 5.0–15.0)
BUN: 18 mg/dL (ref 7.0–19.0)
Bilirubin, Total: 0.5 mg/dL (ref 0.2–1.2)
CO2: 29 mEq/L (ref 21–29)
Calcium: 9.8 mg/dL (ref 7.9–10.2)
Chloride: 103 mEq/L (ref 100–111)
Creatinine: 0.7 mg/dL (ref 0.4–1.5)
Globulin: 3.3 g/dL (ref 2.0–3.7)
Glucose: 97 mg/dL (ref 70–100)
Potassium: 4.3 mEq/L (ref 3.5–5.1)
Protein, Total: 7.3 g/dL (ref 6.0–8.3)
Sodium: 138 mEq/L (ref 136–145)

## 2020-08-01 LAB — GFR: EGFR: >60

## 2020-08-01 LAB — HEMOLYSIS INDEX: Hemolysis Index: 2 (ref 0–24)

## 2020-08-01 LAB — DIGOXIN LEVEL: Digoxin Level: 0.6 ng/mL (ref 0.5–2.0)

## 2020-08-01 NOTE — Addendum Note (Signed)
Addended by: Gretta Began on: 08/01/2020 03:23 PM     Modules accepted: Orders

## 2020-08-01 NOTE — Addendum Note (Signed)
Addended by: REYES-GARCIA, EUGENIA J on: 08/01/2020 03:23 PM     Modules accepted: Orders

## 2020-08-05 ENCOUNTER — Inpatient Hospital Stay: Payer: Medicare Other | Admitting: Rehabilitative and Restorative Service Providers"

## 2020-08-05 DIAGNOSIS — M25611 Stiffness of right shoulder, not elsewhere classified: Secondary | ICD-10-CM

## 2020-08-05 DIAGNOSIS — M25511 Pain in right shoulder: Secondary | ICD-10-CM

## 2020-08-05 NOTE — PT/OT Therapy Note (Signed)
Name: Cynthia Kim Age: 87 y.o.   Date of Service: 08/05/2020  Referring Physician: Cathie Beams, MD   Date of Injury: 06/30/2020  Date Care Plan Established/Reviewed: 07/31/2020  Date Treatment Started: 07/31/2020  End of Certification Date: 10/28/2020  Sessions in Plan of Care: 16  Surgery Date: No data was found    Visit Count: 2   Diagnosis:   1. Acute pain of right shoulder    2. Stiffness of right shoulder joint        Subjective     Social Support/Occupation  Lives in: multiple level home  Lives with: spouse  Occupation: Retired     Pt stated that she felt better after the last session, but the pain returned soon after. She still has a lot pain in the R shoulder when raising the shoulder and donning/doffing a coat.       Precautions: No data was found  Allergies: Amoxicillin, Norco [hydrocodone-acetaminophen], and Other                Treatment     Therapeutic Exercises - Justified to address any of the following: develop strength, endurance, ROM and/or flexibility.   Pt educated on anatomical structures that are relevant to her current issues.    Supine dowel flexion 2x5 with moderate verbal/tactile cuing to help improve stability of the R scap during the exercise. Pt told to stay under her threshold of pain.     Seated scap retractions x10 with verbal/tactile cueing for form.     Manual Therapy - Justified to address any of the following:  Mobilization of joints and soft tissues, manipulation, manual lymphatic drainage, and/or manual traction.    Gentle STM to the R bicep, deltoid, and infraspinatus. Extensive time taken to complete.        ---      ---   Total Time    Timed Minutes 25 minutes   Total Time 25 minutes        Assessment   Pt was able to complete all exercises fully this session. She struggled with supine dowel flexion, but was able to complete it when cued to stay under her threshold of pain. Continued tx to help improve R shoulder AAROM and AROM will be beneficial moving forward. Pt would  also benefit from NMR to improve R scap stabilization. Pt arrived 15 min late this session.   Plan   R scap stabilization  R shoulder AAROM and AROM      Goals    Goal 1: Pt will be able to demo HEP with zero to min cuing with good form to allow for independent performance at home and return to PLOF    Access Code: [email or web address]  URL: [email or web address]  Date: 08/05/2020  Prepared by: Lawernce Ion    Exercises  Supine Shoulder Flexion with Dowel - 1 x daily - 7 x weekly - 10 reps  Seated Scapular Retraction - 1 x daily - 7 x weekly - 20 reps     Sessions: 16      Goal 2: Pt will score >/=58 /100 on FOTO FS Primary measure demonstrating improved functional mobility for return to PLOF     Sessions: 16      Goal 3: Patient will demonstrate shoulder flexion AROM of greater than or equal to 120 degrees to allow patient to lift objects into overhead cabinets without pain       Sessions: 16      Goal 4: Patient will  demonstrate functional ER to T4 to allow patient to wash and comb hair without excessive effort       Sessions: 16                                Sunny Schlein, DPT

## 2020-08-08 ENCOUNTER — Inpatient Hospital Stay: Payer: Medicare Other | Admitting: Rehabilitative and Restorative Service Providers"

## 2020-08-11 ENCOUNTER — Inpatient Hospital Stay: Payer: Medicare Other | Admitting: Rehabilitative and Restorative Service Providers"

## 2020-08-11 DIAGNOSIS — M25611 Stiffness of right shoulder, not elsewhere classified: Secondary | ICD-10-CM

## 2020-08-11 DIAGNOSIS — M25511 Pain in right shoulder: Secondary | ICD-10-CM

## 2020-08-11 NOTE — PT/OT Therapy Note (Signed)
Name: Cynthia Kim Age: 87 y.o.   Date of Service: 08/11/2020  Referring Physician: Cathie Beams, MD   Date of Injury: 06/30/2020  Date Care Plan Established/Reviewed: 07/31/2020  Date Treatment Started: 07/31/2020  End of Certification Date: 10/28/2020  Sessions in Plan of Care: 16  Surgery Date: No data was found    Visit Count: 3   Diagnosis:   1. Acute pain of right shoulder    2. 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 Able to do the scap retractions though.       Precautions: No data was found  Allergies: Amoxicillin, Norco [hydrocodone-acetaminophen], and Other    Objective   08/11/2020 RS     Shoulder flex 85 deg                 Treatment     Therapeutic Exercises - Justified to address any of the following: develop strength, endurance, ROM and/or flexibility.   Reviewed current HEP with patient and discussed appropriate performance and progression of program.     S/L shoulder ER x 15 reps with cues to keep elbow at side and manual placement of shoulder into proper position     Shoulder Isomerics 10 repetitions for 10 seconds per repetition.  ER and IR with cues for appropriate pressure     Neuromuscular Re-Education - Justified to address any of the following: of movement, balance, coordination, kinesthetic sense, posture and/or proprioception for sitting and/or standing activities.   Pt in S/L - facilitation of scap anterior elevation (AE) pattern needed for reaching overhead   Setting of scap into posterior depression (PD), then taking scap through the pattern from PD into AE whille assessing for any mechanical limitations  End range hold scap AE to facilitate efficient core first strategy, then eccentric lengthening of cervical muscles (upper trap, levator scap) towards PD  followed by PH in slightly lengthened range. Followed by quick stretch resisting into AE. Continued combination of isotonics (COI) through increasing ROM    Pivot prone (to improve postural stability):  Use of verbal/tactile cues for pt to slightly elevate shoulders with UEs by side   Followed by externally rotating shoulders / palms facing forward while adducting scapulas   Then relaxing bilateral scaps into posterior depression      Manual Therapy - Justified to address any of the following:  Mobilization of joints and soft tissues, manipulation, manual lymphatic drainage, and/or manual traction.    STM to UT, bicep tendon, infraspinatus        ---      ---   Total Time    Timed Minutes 38 minutes   Total Time 38 minutes        Assessment   Patient cont to have difficulty with RTC engagement causing sup translation of humerus during shoulder elevation. Patient has pain during RTC isometrics and requires max cuing for form. Patient would continue to benefit from PT to improve posture and dec stress on G-H joint.   Plan   Progress scap stability       Goals    Goal 1: Pt will be able to demo HEP with zero to min cuing with good form to allow for  independent performance at home and return to [facility]    08/11/2020 RS Patient compliant with HEP    Access Code: [email or web address]  URL: [email or web address]  Date: 08/11/2020  Prepared by: Reginal Lutes    Exercises  .Supine Shoulder Flexion with Dowel - 1 x daily - 7 x weekly - 10 reps  .Seated Scapular Retraction - 1 x daily - 7 x weekly - 20 reps  .Standing Pivot Prone - 1 x daily - 5 x weekly - 3 sets - 10 reps       Sessions: 16      Goal 2: Pt will score >/=58 /100 on FOTO FS Primary measure demonstrating improved functional mobility for return to PLOF     Sessions: 16      Goal 3: Patient will demonstrate shoulder flexion AROM of greater than or equal to 120 degrees to allow patient to lift objects into overhead cabinets without pain       Sessions: 16      Goal 4:  Patient will demonstrate functional ER to T4 to allow patient to wash and comb hair without excessive effort       Sessions: 16                                Reginal Lutes, DPT

## 2020-08-14 ENCOUNTER — Inpatient Hospital Stay: Payer: Medicare Other | Attending: Anesthesiology | Admitting: Rehabilitative and Restorative Service Providers"

## 2020-08-14 DIAGNOSIS — M25511 Pain in right shoulder: Secondary | ICD-10-CM | POA: Insufficient documentation

## 2020-08-14 DIAGNOSIS — M25611 Stiffness of right shoulder, not elsewhere classified: Secondary | ICD-10-CM | POA: Insufficient documentation

## 2020-08-14 NOTE — PT/OT Therapy Note (Signed)
Name: Cynthia Kim Age: 87 y.o.   Date of Service: 08/14/2020  Referring Physician: Cathie Beams, MD   Date of Injury: 06/30/2020  Date Care Plan Established/Reviewed: 07/31/2020  Date Treatment Started: 07/31/2020  End of Certification Date: 10/28/2020  Sessions in Plan of Care: 16  Surgery Date: No data was found    Visit Count: 4   Diagnosis:   1. Acute pain of right shoulder    2. Stiffness of right shoulder joint        Subjective     Social Support/Occupation  Lives in: multiple level home  Lives with: spouse  Occupation: Retired     Pt stated that she hasn't been doing the dowel exercise because it was causing pain in the shoulder, but she tried it this morning and she was able to go through a smaller range without pain. Pt stated that she hasn't had the stabbing pain in the R shoulder recently, so the shoulder is feeling better overall. She felt fine after last session with Alycia Rossetti. She has relief in the shoulder for about 36 hours after therapy, but the pain always returns.       Precautions: No data was found  Allergies: Amoxicillin, Norco [hydrocodone-acetaminophen], and Other                Treatment     Therapeutic Exercises - Justified to address any of the following: develop strength, endurance, ROM and/or flexibility.   Pt educated on anatomical structures that are relevant to her current issues.    Review of current HEP with verbal cueing to help improve form with pivot prone. Extensive time taken to complete.     UBE x3 min (fwd) x3 min with verbal cueing for form.     Pulleys x2 min each (flex/abd) with verbal/tactile cueing for form.    Side lying abduction x10 with moderate verbal/tactile cueing to keep R scap in proper position to prevent R shoulder pain with the movement.  Extensive time taken to complete.     Neuromuscular Re-Education - Justified to address any of the following: of movement, balance, coordination, kinesthetic sense, posture and/or proprioception for sitting and/or standing  activities.   Rhythmic initiation of the R scap into AE/PD. Moderate verbal/tactile cueing needed for form.     Prolonged holds of the R scap into PD with COI into AE/PD. Moderate verbal/tactile cueing for form.        ---      ---   Total Time    Timed Minutes 40 minutes   Total Time 40 minutes        Assessment   Pt was able to complete all exercises fully this session, but mentioned that she felt some soreness in the muscles by the end of the session. Pt's weakness and decreased R scap stabilization continues to contribute to her R shoulder pain. Continued tx to address these issues will be beneficial moving forward.   Plan   R scap stabilization  R UE strength       Goals    Goal 1: Pt will be able to demo HEP with zero to min cuing with good form to allow for independent performance at home and return to [facility]    08/11/2020 RS Patient compliant with HEP    Access Code: [email or web address]  URL: [email or web address]  Date: 08/11/2020  Prepared by: Reginal Lutes    Exercises  .Supine Shoulder Flexion with Dowel - 1 x daily - 7 x weekly -  10 reps  .Seated Scapular Retraction - 1 x daily - 7 x weekly - 20 reps  .Standing Pivot Prone - 1 x daily - 5 x weekly - 3 sets - 10 reps       Sessions: 16      Goal 2: Pt will score >/=58 /100 on FOTO FS Primary measure demonstrating improved functional mobility for return to PLOF     Sessions: 16      Goal 3: Patient will demonstrate shoulder flexion AROM of greater than or equal to 120 degrees to allow patient to lift objects into overhead cabinets without pain       Sessions: 16      Goal 4: Patient will demonstrate functional ER to T4 to allow patient to wash and comb hair without excessive effort       Sessions: 16                                Sunny Schlein, DPT

## 2020-08-19 ENCOUNTER — Inpatient Hospital Stay: Payer: Medicare Other | Admitting: Rehabilitative and Restorative Service Providers"

## 2020-08-19 DIAGNOSIS — M25611 Stiffness of right shoulder, not elsewhere classified: Secondary | ICD-10-CM

## 2020-08-19 DIAGNOSIS — M25511 Pain in right shoulder: Secondary | ICD-10-CM

## 2020-08-19 NOTE — PT/OT Therapy Note (Signed)
Name: Cynthia Kim Age: 87 y.o.   Date of Service: 08/19/2020  Referring Physician: Cathie Beams, MD   Date of Injury: 06/30/2020  Date Care Plan Established/Reviewed: 07/31/2020  Date Treatment Started: 07/31/2020  End of Certification Date: 10/28/2020  Sessions in Plan of Care: 16  Surgery Date: No data was found    Visit Count: 5   Diagnosis:   1. Acute pain of right shoulder    2. Stiffness of right shoulder joint        Subjective     Social Support/Occupation  Lives in: multiple level home  Lives with: spouse  Occupation: Retired     Pt stated that she still has pain in the R shoulder, but she is able to do more ADL's with not as much pain. She stated that turning on the faucet is better for her, and she has less pain lying down on the R side. She still finds that putting on a jacket is still very painful for her.       Precautions: No data was found  Allergies: Amoxicillin, Norco [hydrocodone-acetaminophen], and Other                Treatment     Therapeutic Exercises - Justified to address any of the following: develop strength, endurance, ROM and/or flexibility.   Pt educated on anatomical structures that are relevant to her current issues.    Review of current HEP.    UBE x3 min (fwd) x3 min with verbal cueing for form.     Pulleys x2 min each (flex/abd) with verbal/tactile cueing for form.    Seated bicep curls 2# 3x10  Supine shoulder flexion 0# x15  Sport cord rows (red) 2x10   Pt required moderate verbal/tactile cueing to complete the above exercises properly. Extensive time taken to complete.        ---      ---   Total Time    Timed Minutes 40 minutes   Total Time 40 minutes        Assessment   Pt was able to complete all exercises without increased pain in the R shoulder this session, but she was fatigued and challneged with exercises today. Pt would benefit from continued exercises to help improve R shoulder strength and R scap stabilization.  She continues to get stronger each session.   Plan    R shoulder strength  R scap stabilization       Goals    Goal 1: Pt will be able to demo HEP with zero to min cuing with good form to allow for independent performance at home and return to [facility]    08/11/2020 RS Patient compliant with HEP    Access Code: [email or web address]  URL: [email or web address]  Date: 08/11/2020  Prepared by: Reginal Lutes    Exercises  .Supine Shoulder Flexion with Dowel - 1 x daily - 7 x weekly - 10 reps  .Seated Scapular Retraction - 1 x daily - 7 x weekly - 20 reps  .Standing Pivot Prone - 1 x daily - 5 x weekly - 3 sets - 10 reps       Sessions: 16      Goal 2: Pt will score >/=58 /100 on FOTO FS Primary measure demonstrating improved functional mobility for return to PLOF     Sessions: 16      Goal 3: Patient will demonstrate shoulder flexion AROM of greater than or equal to 120 degrees to allow patient to lift  objects into overhead cabinets without pain       Sessions: 16      Goal 4: Patient will demonstrate functional ER to T4 to allow patient to wash and comb hair without excessive effort       Sessions: 16                                Sunny Schlein, DPT

## 2020-08-21 ENCOUNTER — Inpatient Hospital Stay: Payer: Medicare Other | Admitting: Rehabilitative and Restorative Service Providers"

## 2020-08-21 DIAGNOSIS — M25511 Pain in right shoulder: Secondary | ICD-10-CM

## 2020-08-21 DIAGNOSIS — M25611 Stiffness of right shoulder, not elsewhere classified: Secondary | ICD-10-CM

## 2020-08-21 NOTE — PT/OT Therapy Note (Signed)
Name: Cynthia Kim Age: 87 y.o.   Date of Service: 08/21/2020  Referring Physician: Cathie Beams, MD   Date of Injury: 06/30/2020  Date Care Plan Established/Reviewed: 07/31/2020  Date Treatment Started: 07/31/2020  End of Certification Date: 10/28/2020  Sessions in Plan of Care: 16  Surgery Date: No data was found    Visit Count: 6   Diagnosis:   1. Acute pain of right shoulder    2. Stiffness of right shoulder joint        Subjective     Social Support/Occupation  Lives in: multiple level home  Lives with: spouse  Occupation: Retired     Pt stated that she had a lot of discomfort in the R shoulder yesterday while she was just sitting around.  Pt stated that this pain is different from what she usually feels. She had no issues the day of last session, and she feels okay today. She R shoulder is feeling stiffer than usual today.       Precautions: No data was found  Allergies: Amoxicillin, Norco [hydrocodone-acetaminophen], and Other                Treatment     Therapeutic Exercises - Justified to address any of the following: develop strength, endurance, ROM and/or flexibility.   Pt educated on anatomical structures that are relevant to her current issues. Pt also educated in what DOMS is and how it will present. Extensive time taken to complete.     Review of current HEP.    Supine dowel flexion x15   Supine TB (red) IR at neutral x15  Sidelying ER w/ 1# 2x10    Attempted supine TB ER at neutral (red), but discontinued due to pain in the R shoulder.     PROM of the R shoulder into IR/ER at 45 degrees abduction in supine.     UBE x3 min (fwd) x3 min with moderate verbal cueing for form.     FOTO survey was given to the pt this session. The results of the survey were reviewed and pt was made aware of their importance.       ---      ---   Total Time    Timed Minutes 40 minutes   Total Time 40 minutes        Assessment   Pt was able to complete all exercises fully, but stated that she had some discomfort at the R  bicep during the UBE machine. Pt continues to struggle with ROM and strength.  Decrease in FOTO score today showed that pt may need to return to her MD for further interventions to help with her R shoulder pain. Next session may be the pt's last if her symptoms do not improve.  Plan   Possible discharge to HEP next session      Goals    Goal 1: Pt will be able to demo HEP with zero to min cuing with good form to allow for independent performance at home and return to [facility]    08/11/2020 RS Patient compliant with HEP    Access Code: [email or web address]  URL: [email or web address]  Date: 08/11/2020  Prepared by: Reginal Lutes    Exercises  .Supine Shoulder Flexion with Dowel - 1 x daily - 7 x weekly - 10 reps  .Seated Scapular Retraction - 1 x daily - 7 x weekly - 20 reps  .Standing Pivot Prone - 1 x daily - 5 x weekly - 3 sets -  10 reps       Sessions: 16      Goal 2: Pt will score >/=58 /100 on FOTO FS Primary measure demonstrating improved functional mobility for return to PLOF    FOTO score = 40 KD 08/21/20   Sessions: 16      Goal 3: Patient will demonstrate shoulder flexion AROM of greater than or equal to 120 degrees to allow patient to lift objects into overhead cabinets without pain       Sessions: 16      Goal 4: Patient will demonstrate functional ER to T4 to allow patient to wash and comb hair without excessive effort       Sessions: 16                                Sunny Schlein, DPT

## 2020-08-28 IMAGING — CT CT BIOPSY
1 of 2 series · 14 of 32 positions shown, 19 images · non-contrast
Comparison: none

CLINICAL DATA: Metastatic thyroid carcinoma. Cavitary
hypermetabolic left lobe lung mass. Biopsy requested.

[Series 2: i-spiral 5.0 b40f · axial · 0.86mm/px · z∈[+1072,+1208]mm · 14 of 45 slices shown, 19 images]
[im 3/45  soft-tissue]
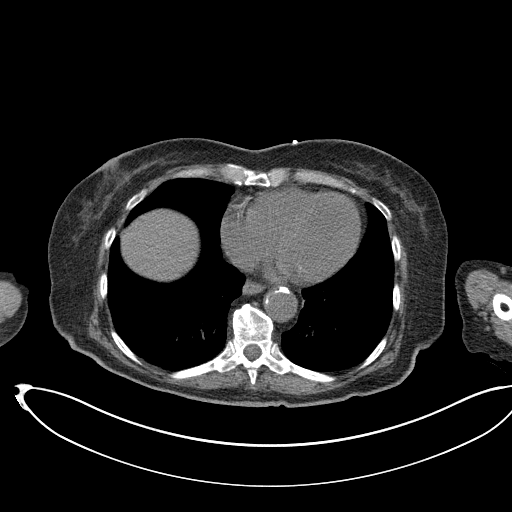
[im 3/45  bone]
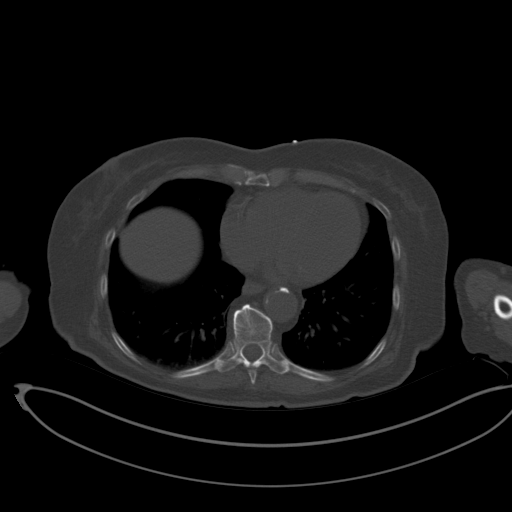
[im 7/45  soft-tissue]
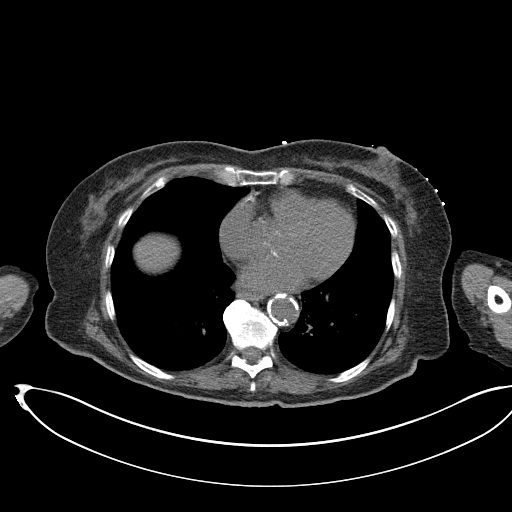
[im 9/45  soft-tissue]
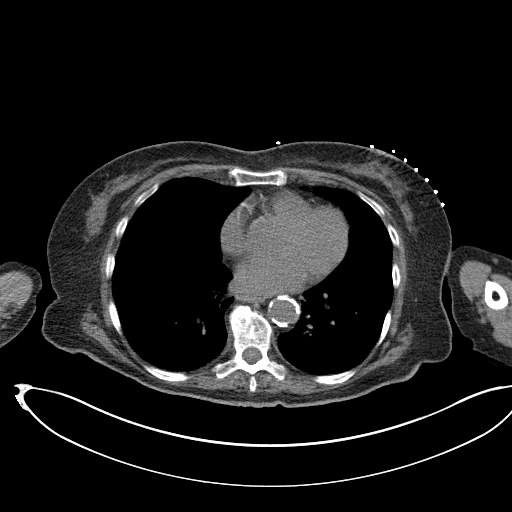
[im 14/45  soft-tissue]
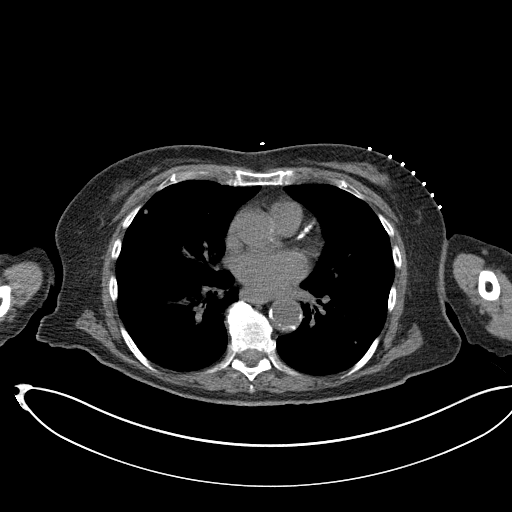
[im 16/45  soft-tissue]
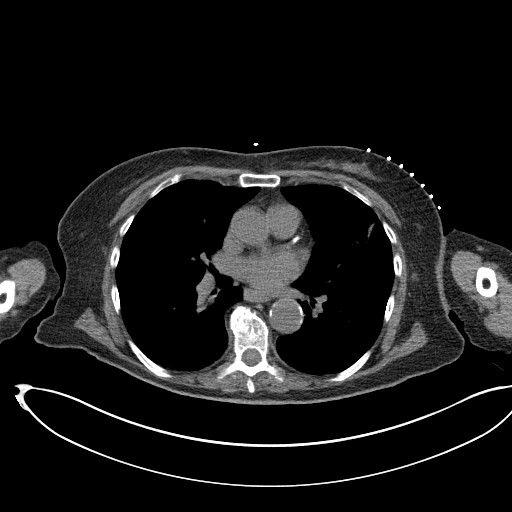
[im 20/45  soft-tissue]
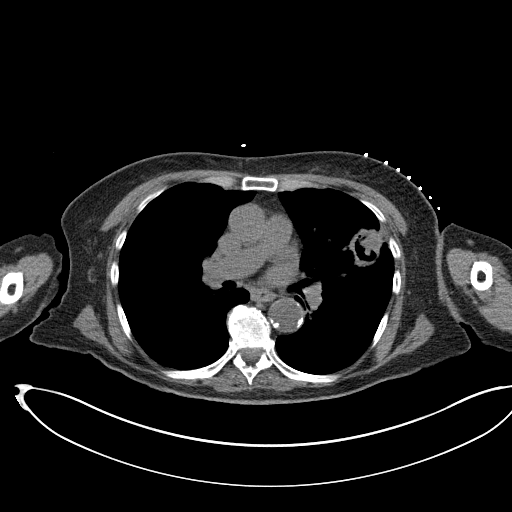
[im 23/45  soft-tissue]
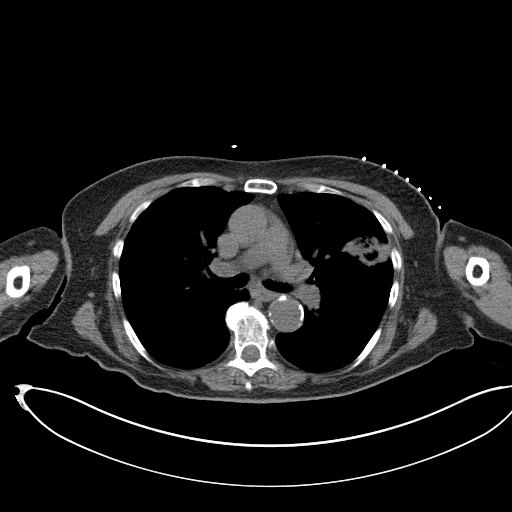
[im 25/45  soft-tissue]
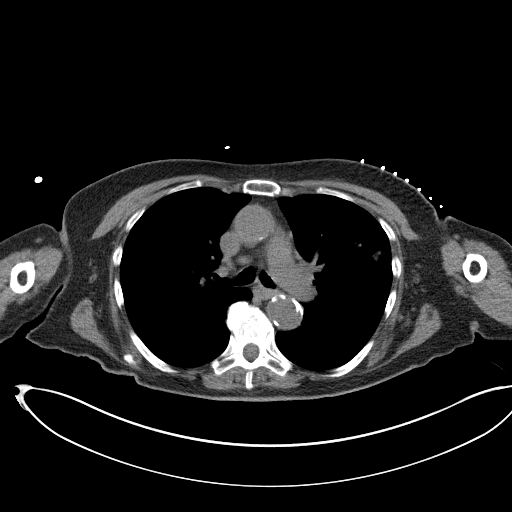
[im 29/45  soft-tissue]
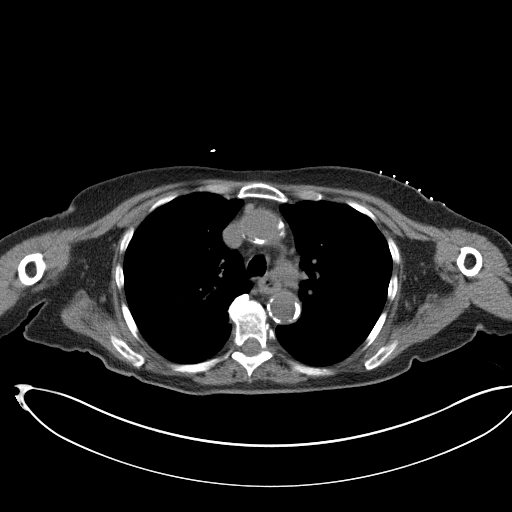
[im 29/45  bone]
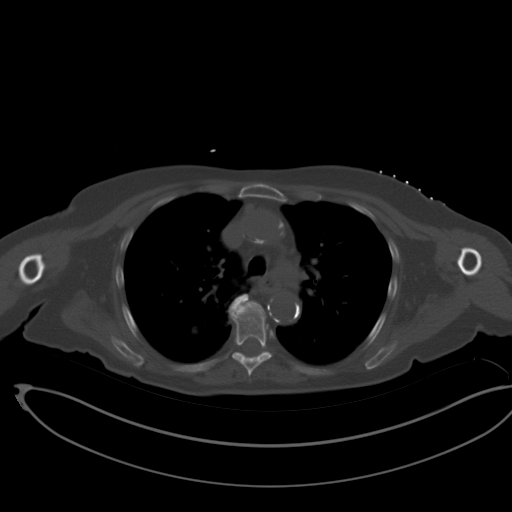
[im 31/45  soft-tissue]
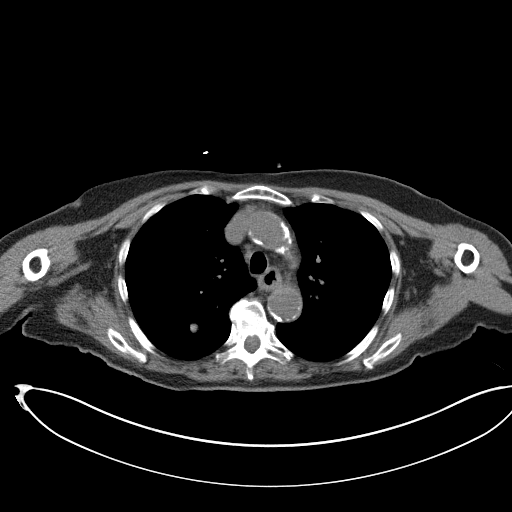
[im 36/45  soft-tissue]
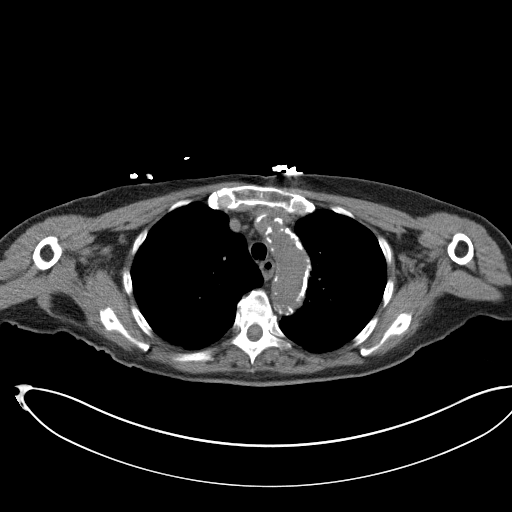
[im 36/45  lung]
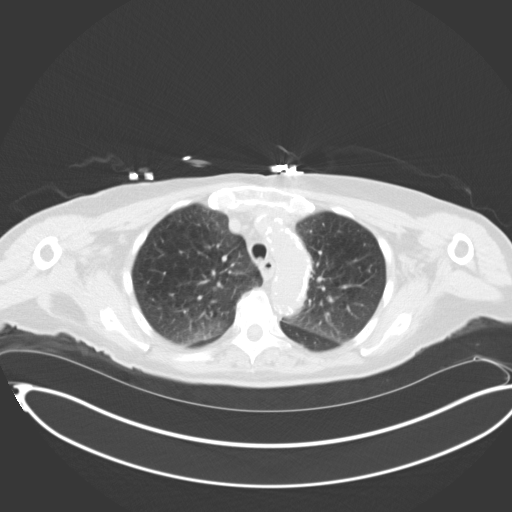
[im 38/45  soft-tissue]
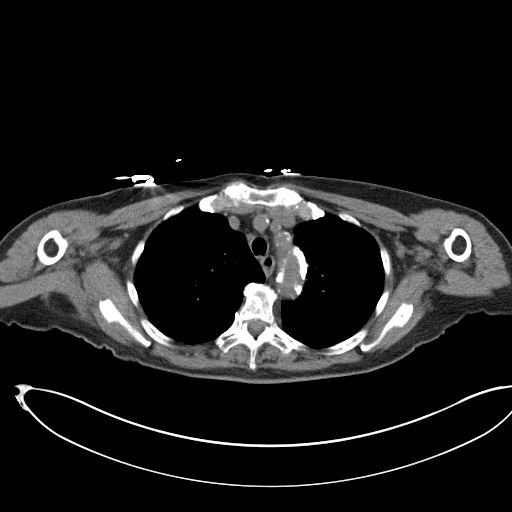
[im 38/45  lung]
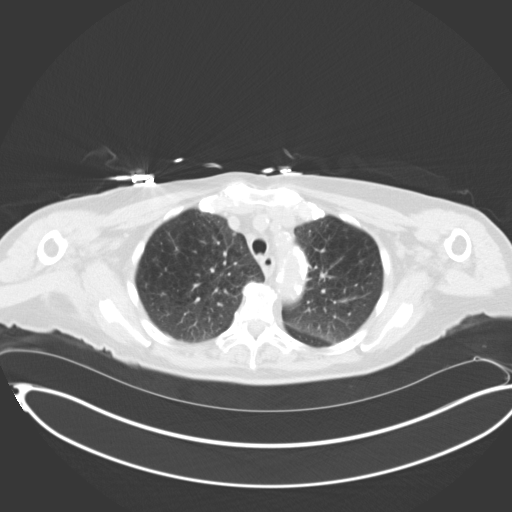
[im 40/45  lung]
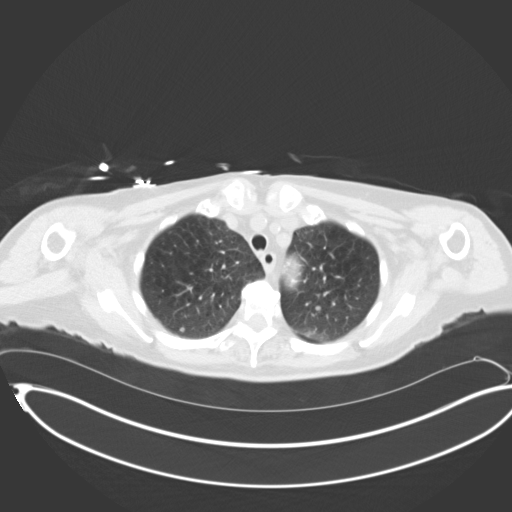
[im 42/45  soft-tissue]
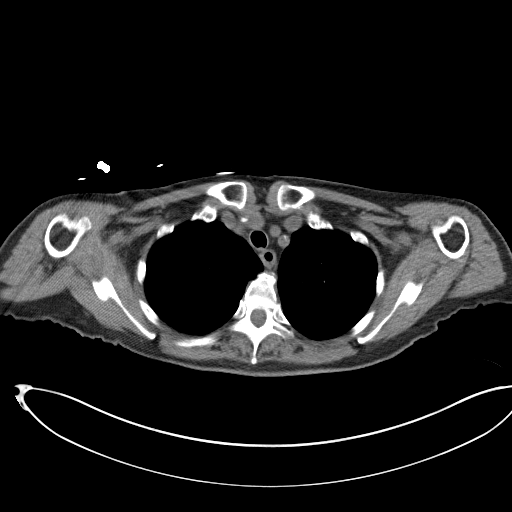
[im 42/45  lung]
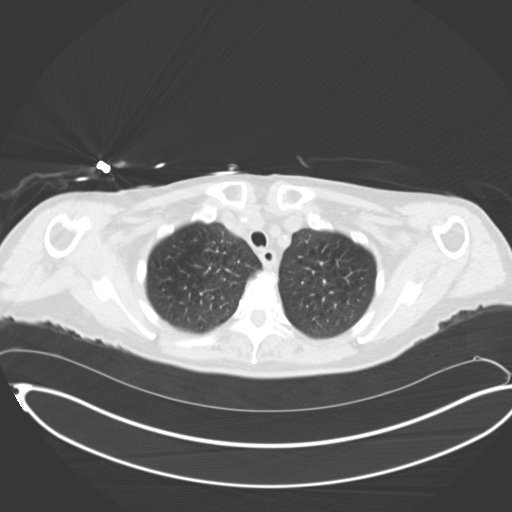

[14 of 32 positions shown; findings below may reference images not displayed]

EXAM:
CT GUIDED CORE BIOPSY OF LEFT LUNG MASS

ANESTHESIA/SEDATION:
Intravenous Fentanyl 36mcg and Versed 1mg were administered as
conscious sedation during continuous monitoring of the patient's
level of consciousness and physiological / cardiorespiratory status
by the radiology RN, with a total moderate sedation time of 10
minutes.

PROCEDURE:
The procedure risks, benefits, and alternatives were explained to
the patient. Questions regarding the procedure were encouraged and
answered. The patient understands and consents to the procedure.

Select axial scans through the thorax obtained. The cavitary left
lung lesion was localized and an appropriate skin entry site was
determined and marked.

The operative field was prepped with chlorhexidinein a sterile
fashion, and a sterile drape was applied covering the operative
field. A sterile gown and sterile gloves were used for the
procedure. Local anesthesia was provided with 1% Lidocaine.

Under CT fluoroscopic guidance, a 17 gauge trocar needle was
advanced to the margin of the lesion, taking care not to traverse
aerated lung. Once needle tip position was confirmed, coaxial
18-gauge core biopsy samples were obtained, submitted in formalin to
surgical pathology. The guide needle was removed.

Postprocedure scans show minimal regional alveolar hemorrhage. No
pneumothorax.

COMPLICATIONS:
None immediate
FINDINGS: Cavitary anterior left lung mass was again localized. Representative
core biopsy samples obtained as above.
IMPRESSION: 1. Technically successful CT-guided core biopsy, cavitary left lung
mass.

## 2020-08-30 ENCOUNTER — Inpatient Hospital Stay: Payer: Medicare Other | Admitting: Rehabilitative and Restorative Service Providers"

## 2020-08-30 DIAGNOSIS — M25511 Pain in right shoulder: Secondary | ICD-10-CM

## 2020-08-30 DIAGNOSIS — M25611 Stiffness of right shoulder, not elsewhere classified: Secondary | ICD-10-CM

## 2020-08-30 NOTE — Progress Notes (Signed)
Name:Cynthia Kim Age: 87 y.o.   Date of Service: 08/30/2020  Referring Physician: Cathie Beams, MD   Date of Injury: 06/30/2020  Date Care Plan Established/Reviewed: 07/31/2020  Date Treatment Started: 07/31/2020  End of Certification Date: 10/28/2020  Sessions in Plan of Care: 16  Surgery Date: No data was found      Visit Count: 7   Diagnosis:   1. Acute pain of right shoulder    2. Stiffness of right shoulder joint        Subjective     Social Support/Occupation  Lives in: multiple level home  Lives with: spouse  Occupation: Retired     Pt stated that she has been able to complete more ADL's with less pain, but the shoulder still becomes very painful for her if she tries to lift things above shoulder height. She felt fine after last session.       Precautions: No data was found  Allergies: Amoxicillin, Norco [hydrocodone-acetaminophen], and Other    Past Medical History:   Diagnosis Date   . A-fib    . Arthritis    . Atrial fibrillation                     Upper Extremity ROM  Right  Initial Eval  AROM Right  Initial Eval  PROM R  AROM  08/30/20 R  PROM  08/30/20 UE ROM Left  Initial Eval  AROM Left  Initial Eval  PROM L   L   90 100 P! 90 p! 100! Shoulder flex 150 160     75 80 P! 85  100 Shoulder abd  175      Functional ER:C5  C7  Shoulder ER Functional ER:T4      Functional IR:T10  T9  Shoulder IR Functional IR:T8          Elbow flex           Elbow ext           Wrist flex           Wrist ext                                          Upper Extremity MMT  /5  Right  Initial Eval R  08/30/20 R R MMT  /5 Left  Initial Eval L L L   3 4   Shoulder flex 4+      3 3+   Shoulder abd 4+      4 5   Elbow flex 5      4+ 5   Elbow ext 5      2+ 4+   Shoulder ER 4+      3 4+   Shoulder IR 4+          Wrist flex           Wrist ext                                             Treatment     Therapeutic Exercises - Justified to address any of the following: develop strength, endurance, ROM and/or flexibility.   Pt educated on anatomical structures that are relevant to her current issues.  Review of current HEP with verbal cueing to help improve form.     Supine flexion 0# 2x10  Sidelying abduction 0# 2x10  Verbal/tactile cueing for the above exercises. Added to HEP.     UBE x3 min (fwd) with verbal cueing for form.     Sport cord rows (green) 2x10 with verbal cueing for form.     FOTO survey was given to the pt this session. The results of the survey were reviewed and pt was made aware of their importance.    Time taken for updated objective measurements billed with TE.       ---      ---   Total Time    Timed Minutes 40 minutes   Total Time 40 minutes        Assessment   Patient has been seen for 7 visits in this case from 07/31/2020 until 08/30/2020. The primary encounter diagnosis was Acute pain of right shoulder. A diagnosis of Stiffness of right shoulder joint was also pertinent to this visit. Patient has made good  progress in range of motion, strength and soft tissue mobility since the initial evaluation. Patient is still demonstrating impairment in pain, range of motion, strength, functional stability and joint mobility.  Due to pt's level of pain with activity during physical therapy and her slow progress with physical therapy recently, pt has been advised to continue with her HEP independently for at least the next 6-8 week, and return to her MD if needed. Patient is ready for discharge from skilled therapy intervention at this time. Pt to be d/c'd to HEP at this time. Pt ed provided re when to seek out skilled PT again if symptoms return and if unable to manage with current HEP.    Goals    Goal 1: Pt will be able to demo HEP with zero to min cuing with good form to allow for independent performance at home and return to [facility]    08/11/2020 RS Patient compliant with  HEP    Access Code: [email or web address]  URL: [email or web address]  Date: 08/30/2020  Prepared by: Lawernce Ion    Exercises  Supine Shoulder Flexion with Dowel - 1 x daily - 7 x weekly - 10 reps  Seated Scapular Retraction - 1 x daily - 7 x weekly - 20 reps  Standing Pivot Prone - 1 x daily - 5 x weekly - 3 sets - 10 reps  Supine Shoulder Flexion Extension Full Range AROM - 5 x weekly - 3 sets - 10 reps  Sidelying Shoulder Abduction Palm Forward - 5 x weekly - 3 sets - 10 reps    MET  KD 08/30/20   Sessions: 16      Goal 2: Pt will score >/=58 /100 on FOTO FS Primary measure demonstrating improved functional mobility for return to PLOF    FOTO score = 40 KD 08/21/20  FOTO score = 50 KD 08/30/20   Sessions: 16      Goal 3: Patient will demonstrate shoulder flexion AROM of greater than or equal to 120 degrees to allow patient to lift objects into overhead cabinets without pain    See objective.    NOT MET   KD 08/30/20   Sessions: 16      Goal 4: Patient will demonstrate functional ER to T4 to allow patient to wash and comb hair without excessive effort    See objective.    NOT MET  KD 08/30/20   Sessions: 16  Shaune Pollack, DPT

## 2020-09-03 IMAGING — DX DG CHEST 2V
2 series · 2 of 2 positions shown · non-contrast
Comparison: PET-CT dated 10/25/2019

CLINICAL DATA: Worsening cough.

EXAM:
CHEST - 2 VIEW

[chest pa]
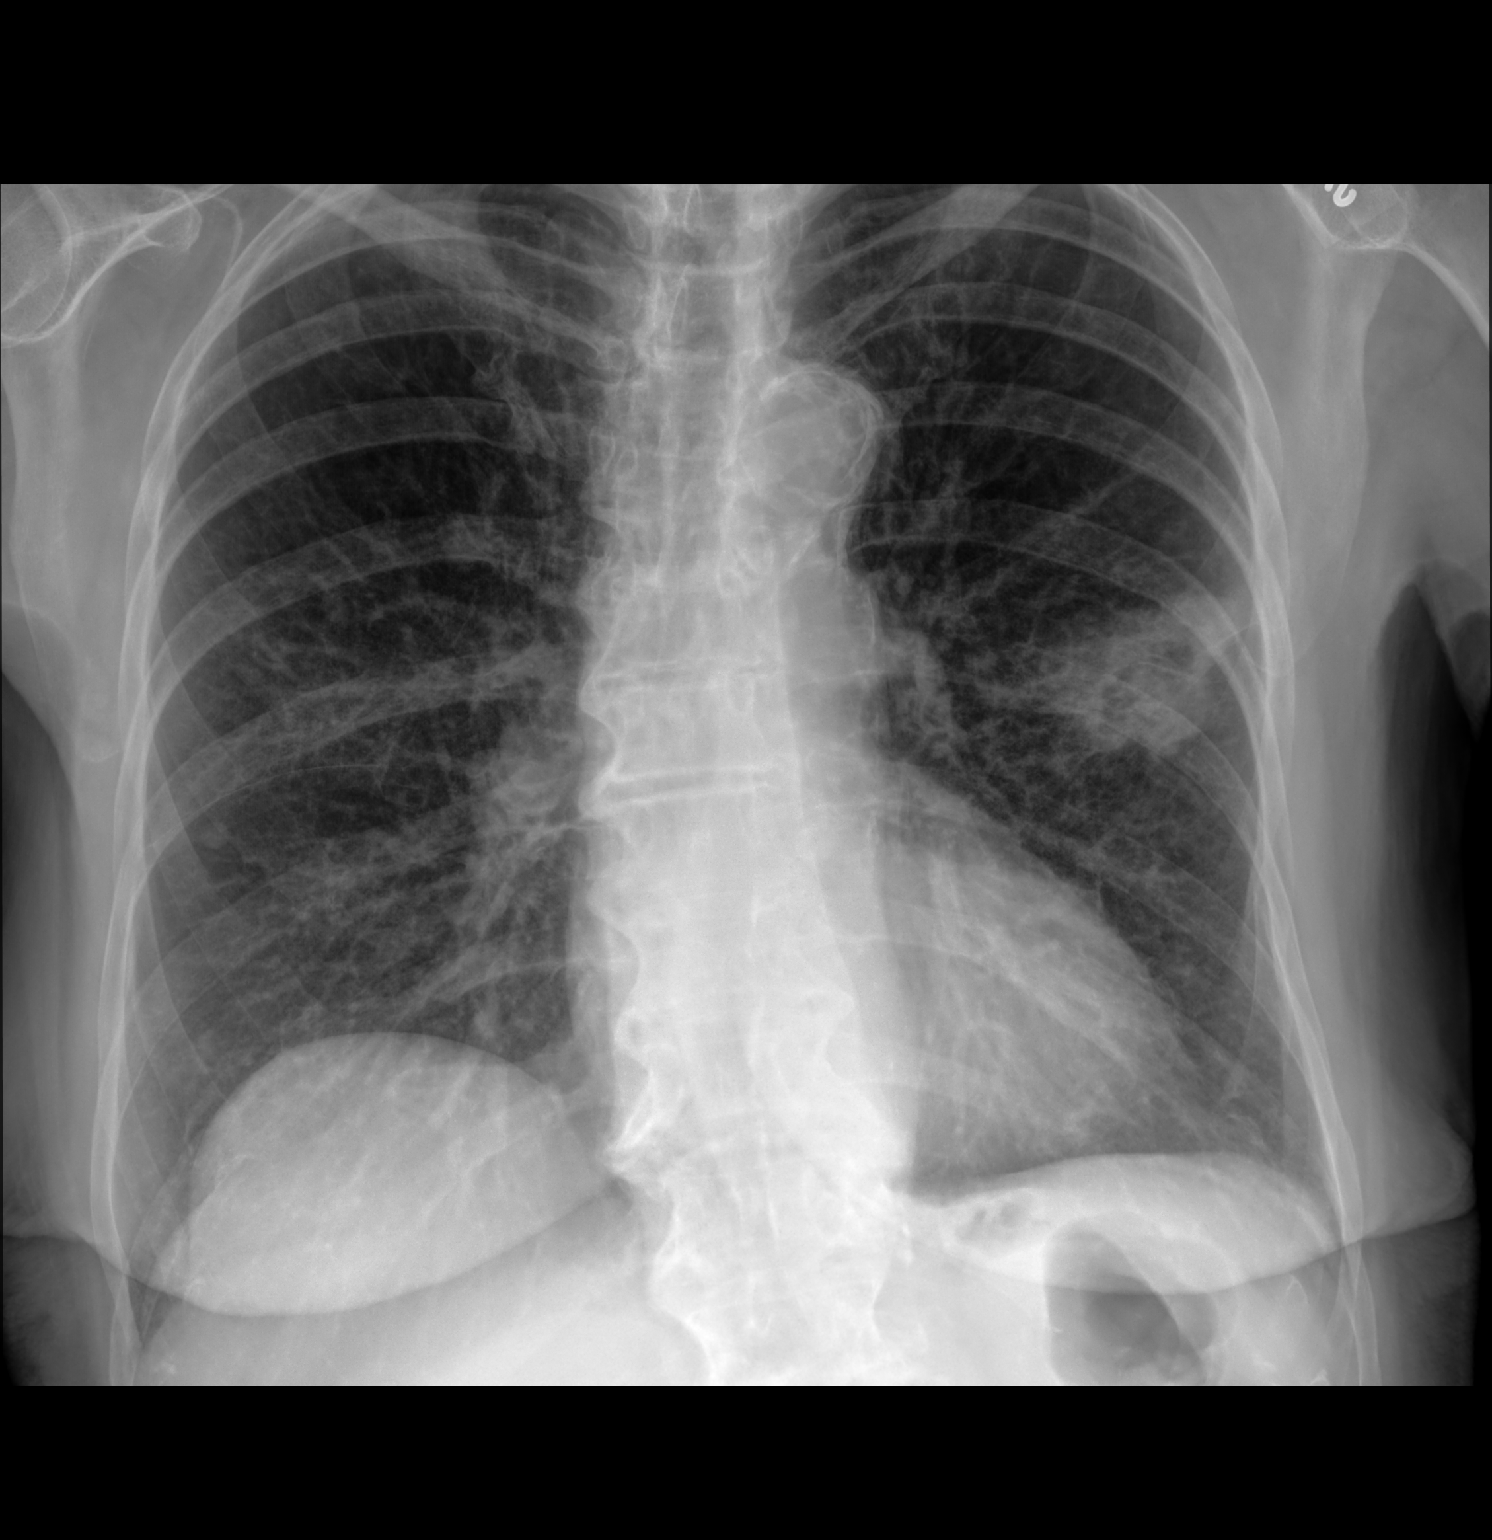

[chest lat]
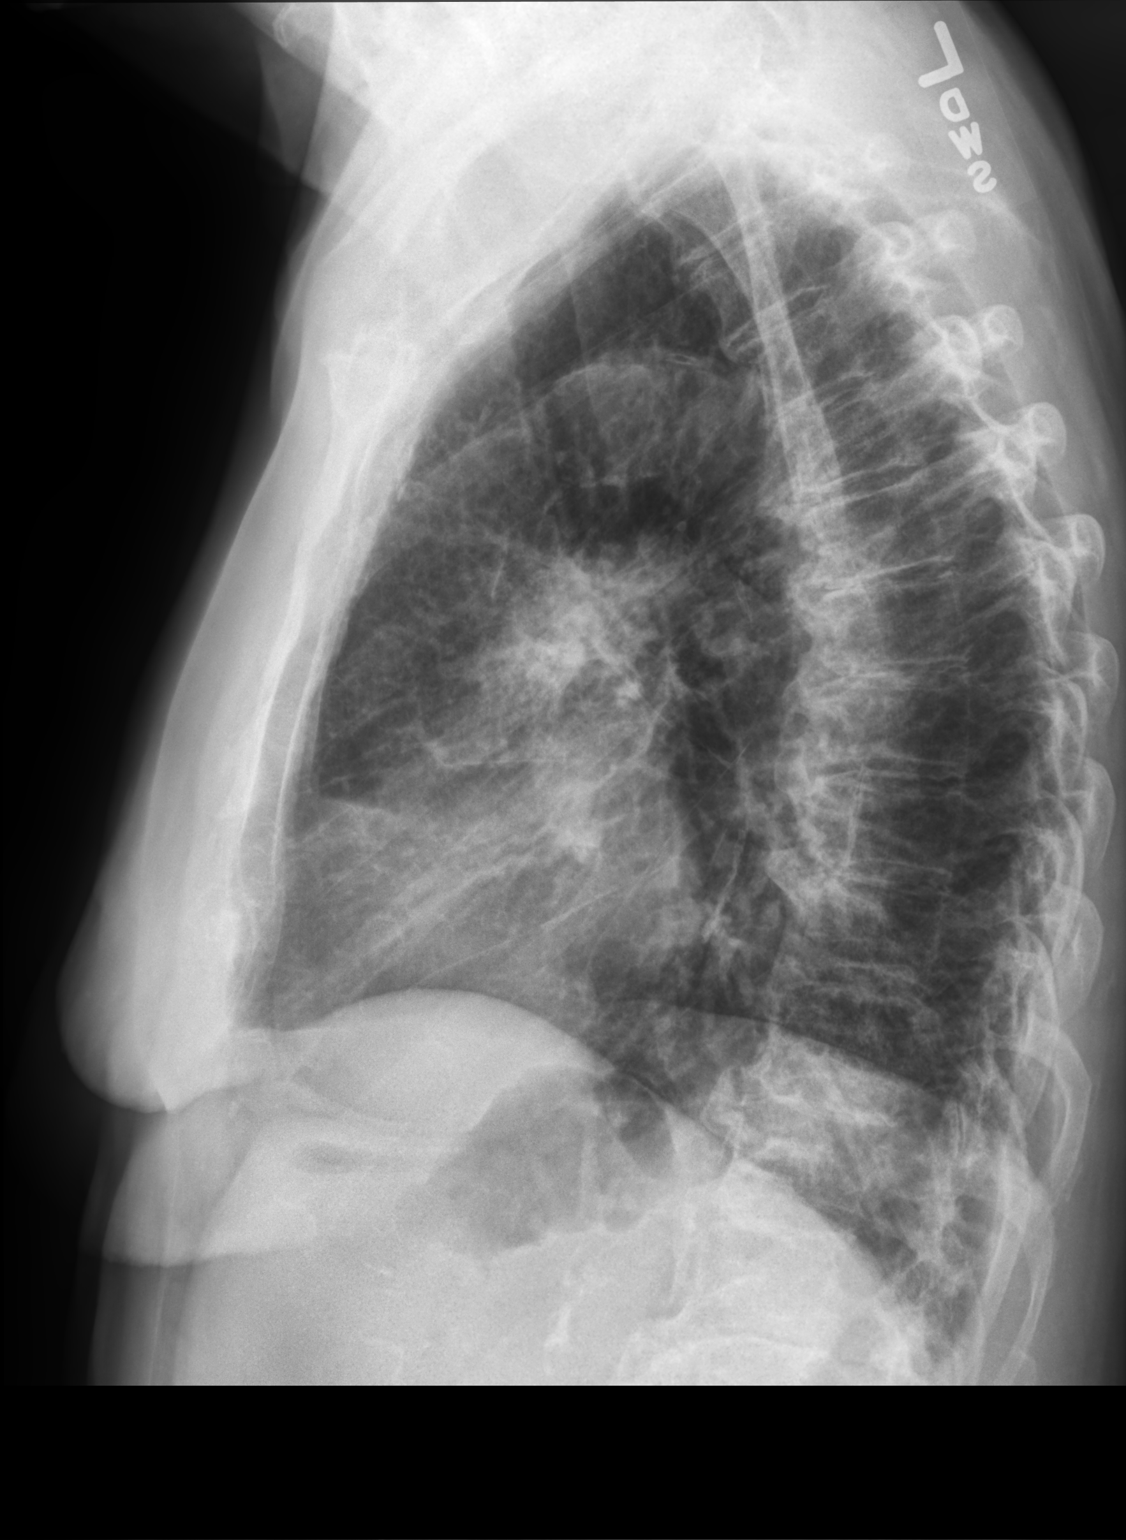

[2 of 2 positions shown; findings below may reference images not displayed]

FINDINGS: Again noted is a cavitary left upper lobe lung lesion as previously
described. There is no pneumothorax. The heart size is mildly
enlarged. Additional small bilateral pulmonary nodules are noted,
most evident on the right. There is no significant pleural effusion.
Atherosclerotic changes are noted of the thoracic aorta.
Degenerative changes are noted throughout the visualized
thoracolumbar spine. There is no definite acute displaced fracture.
IMPRESSION: 1. No acute cardiopulmonary process.
2. Persistent left upper lobe lung mass as previously described.
Additional smaller bilateral pulmonary nodules are again noted,
better visualized on the patient's prior PET-CT.

## 2020-10-13 NOTE — Progress Notes (Signed)
[facility]  [address] [place], [place] [address]     [name]    Date of Visit: 04/12/2018   Date of Birth: 1931/12/28  Age: 87 yrs.  Medical  Record Number: [id number]  __  CURRENT DIAGNOSES     1. Persistent atrial fibrillation, I48.1   2. Pre-op cardiovascular examination, Z01.810  3. Syncope and collapse, R55  4. Abnormal electrocardiogram [ECG] [EKG], R94.31  __  ALLERGIES     NKA  __  MEDICATIONS     1. Aspirin 325 mg Tablet, 1 p.o. q.d.  2. digoxin 125 mcg tablet,  1 po qd  3. Toprol XL 100 mg tablet,extended release, 1 p.o. q.d.  4. Zoloft 25 mg tablet, 1 po qd  __  CHIEF COMPLAINT/REASON FOR VISIT   Followup of Persistent atrial fibrillation  __  HISTORY OF PRESENT ILLNESS  Ms. Usery returns for followup. She is an 87 year old woman with chronic  atrial fibrillation, with a personal choice not to take anticoagulation, and history of remote syncope without recurrence. She is doing well, active, and working on a daily basis and in her garden without any symptoms. She reiterates her desire not to  take anticoagulation.   __  PAST HISTORY     Past Medical Illnesses : No previous history of significant medical illnesses.;  Past Cardiac Illnesses: Atrial fibrillation,  Palpitations; Infectious Diseases: Measles Rubella-German, Chicken Pox; Surgical Procedures : Breast Biopsy, Hysterectomy; Trauma History: No previous history of significant trauma.; Cardiology  Procedures-Invasive: No previous interventional or invasive cardiology procedures.; Cardiology Procedures-Noninvasive : Echocardiogram May 2006, Treadmill Dual Isotope -Normal June 2006, Treadmill Stress Test December 2008, Echocardiogram January 2008, Echocardiogram March 2013; Left Ventricular Ejection Fraction : LVEF of 65.8% documented via echocardiogram on 09/04/2013  ___  FAMILY HISTORY   MaternalAunt -- Hypertension  Sister -- Hypertension     __  CARDIAC RISK FACTORS     Tobacco Abuse: has never used tobacco;  Family  History of Heart Disease: negative; Hyperlipidemia: negative;  Hypertension: negative;  Diabetes Mellitus: negative;  Prior History of Heart Disease: negative; Obesity: negative;  Sedentary Life Style:negative; [id number]; Menopausal :positive  __  SOCIAL HISTORY    Alcohol Use : Does not use alcohol; Smoking: Does not smoke; Never smoker ([id number]); Diet : Regular diet without modifications and Caffeine use-1-2 per day; Exercise: Exercises regularly five times a week and cycling five times a week;    __  REVIEW OF SYSTEMS    General:  feels well, no change in exercise tolerance.; Integumentary: Denies any change in hair or nails, rashes,  or skin lesions.; Eyes: Denies diplopia, history of glaucoma or visual field defects.; Ears, Nose,  Throat, Mouth: Denies any hearing loss, epistaxis, hoarseness or difficulty speaking.;Respiratory : Denies dyspnea, cough, wheezing or hemoptysis.; Cardiovascular:  palpitations; Abdominal : Denies ulcer disease, hematochezia or melena.; Musculoskeletal:Denies any history of venous insufficiency, arthritic symptoms or back problems.; Neurological  : Denies any history of recurrent strokes, TIA, or seizure disorder.; Psychiatric:  anxiety, stress; Endocrine: Denies any history of weight change, heat/cold intolerance, polydipsia, or  polyuria; Hematologic/Immunologic: seasonal allergies  __   PHYSICAL EXAMINATION    Vital Signs:  Blood Pressure:   128/70 Sitting, Left arm, regular cuff  128/72 Sitting, Right arm, regular cuff    Weight: 117.00 lbs.   Height: 64"  BMI: 20.08   Pulse:  68/min.       Constitutional: Cooperative, alert and oriented,well developed, well nourished,  in no acute distress.  Skin: Warm and dry to touch, no apparent skin lesions, or masses noted. Head : Normocephalic, normal hair pattern, no masses or tenderness Eyes: EOMS Intact, PERRL, conjunctivae and  lids unremarkable. Funduscopic exam and visual fields not performed. ENT: Ears, Nose and throat reveal   no gross abnormalities. No pallor or cyanosis. Dentition good. Neck: No palpable masses or adenopathy,  no thyromegaly, no JVD, carotid pulses are full and equal bilaterally without bruits. Chest: Normal symmetry, no tenderness to palpation, normal respiratory  excursion, no intercostal retraction, no use of accessory muscles, normal diaphragmatic excursion, clear to auscultation and percussion. Cardiac: Irregularly  irregular rhythm with variable 1st heart sound, normal 2nd heart sound Abdomen: Abdomen soft, bowel sounds normoactive, no masses, no hepatosplenomegaly,  non-tender, no bruits Peripheral Pulses: The femoral, popliteal, dorsalis pedis, and posterior tibial  pulses are full and equal bilaterally with no bruits auscultated. Extremities/Back: No deformities, clubbing, cyanosis, erythema or edema observed. There  are no spinal abnormalities noted. Normal muscle strength and tone. Neurological: No gross motor or sensory  deficits noted, affect appropriate, oriented to time, person and place.   __    Medications added today by the physician:       IMPRESSIONS:  1. Chronic atrial fibrillation.  2. Remote syncope without recurrence.   3. Personal choice not to take anticoagulation.     PLANS:  1. Continue current doses of digoxin and Toprol.   2. Barring any change in symptoms, I will see her again in six months.    Rogelio Seen, MD, [facility]    JJ/tutbh    cc:  DODD A. SIMS MD   ____________________________  TODAYS ORDERS  12 Lead ECG Today  Return Visit 15 MIN 6 months

## 2020-10-13 NOTE — Progress Notes (Signed)
[facility]  [address] [place], [place] [address]     Cynthia Kim    Date of Visit: 02/20/2016   Date of Birth: 1931/08/08  Age: 87 yrs.  Medical  Record Number: [id number]  __  CURRENT DIAGNOSES     1. Persistent atrial fibrillation, I48.1   2. Syncope and collapse, R55  3. Abnormal electrocardiogram [ECG] [EKG], R94.31  __  ALLERGIES     NKA  __  MEDICATIONS     1. Aspirin 325 mg Tablet, 1 p.o. q.d.  2. Zoloft 25 mg tablet,  1 po qd  3. digoxin 250 mcg tablet, 1 po qd  4. Toprol XL 100 mg tablet,extended release, 1 p.o. q.d.  5. Toprol XL 50 mg tablet,extended release, 1 po qd  6. naproxen 250 mg tablet, PRN  __   CHIEF COMPLAINT/REASON FOR VISIT  Followup of Persistent atrial fibrillation  __  HISTORY OF PRESENT ILLNESS   Cynthia Kim returns for followup. She had an episode of syncope where she started to have vertigo. She is taking multiple medications including pain killers, Naprosyn, and Antivert. She developed nausea and vomiting in that setting and lost consciousness  and hit her head. She is orthostatic in the hospital. She was hydrated and discharged home. She has had no subsequent symptoms. She has a history of chronic atrial fibrillation with a personal choice not to take anticoagulation. She had an echocardiogram.   _____  PAST HISTORY     Past Medical Illnesses : No previous history of significant medical illnesses.;  Past Cardiac Illnesses: Atrial fibrillation,  Palpitations; Infectious Diseases: Measles Rubella-German, Chicken Pox; Surgical Procedures : Breast Biopsy, Hysterectomy; Trauma History: No previous history of significant trauma.; Cardiology  Procedures-Invasive: No previous interventional or invasive cardiology procedures.; Cardiology Procedures-Noninvasive : Echocardiogram May 2006, Treadmill Dual Isotope -Normal June 2006, Treadmill Stress Test December 2008, Echocardiogram January 2008, Echocardiogram March 2013; Left Ventricular Ejection Fraction : LVEF of 65.8%  documented via echocardiogram on 09/04/2013  ___  FAMILY HISTORY   MaternalAunt -- Hypertension  Sister -- Hypertension     __  CARDIAC RISK FACTORS     Tobacco Abuse: has never used tobacco;  Family History of Heart Disease: negative; Hyperlipidemia: negative;  Hypertension: negative;  Diabetes Mellitus: negative;  Prior History of Heart Disease: negative; Obesity: negative;  Sedentary Life Style:negative; UEA:VWUJWJXB; Menopausal :positive  __  SOCIAL HISTORY    Alcohol Use : Does not use alcohol; Smoking: Does not smoke; Never smoker ([id number]); Diet : Regular diet without modifications and Caffeine use-1-2 per day; Exercise: Exercises regularly five times a week and cycling five times a week;    __  PHYSICAL EXAMINATION    Vital Signs:   Blood Pressure:  120/74 Sitting, Left arm, regular cuff    Weight: 121.00 lbs.   Height: 64"  BMI: 21   Pulse:  86/min.   Respirations: 14/min.       Constitutional:  Cooperative, alert and oriented,well developed, well nourished, in no acute distress. Skin: Warm and dry to touch, no apparent skin lesions, or masses  noted. Head: Normocephalic, normal hair pattern, no masses or tenderness  Eyes: EOMS Intact, PERRL, conjunctivae and lids unremarkable. Funduscopic exam and visual fields not performed. ENT : Ears, Nose and throat reveal no gross abnormalities. No pallor or cyanosis. Dentition good. Neck:  No palpable masses or adenopathy, no thyromegaly, no JVD, carotid pulses are full and equal bilaterally without bruits. Chest: Normal symmetry, no tenderness  to palpation,  normal respiratory excursion, no intercostal retraction, no use of accessory muscles, normal diaphragmatic excursion, clear to auscultation and percussion. Cardiac : Irregularly irregular rhythm with variable 1st heart sound, normal 2nd heart sound Abdomen: Abdomen soft, bowel sounds normoactive, no masses, no hepatosplenomegaly,  non-tender, no bruits Peripheral Pulses: The femoral, popliteal, dorsalis pedis,  and posterior tibial  pulses are full and equal bilaterally with no bruits auscultated. Extremities/Back: No deformities, clubbing, cyanosis, erythema or edema observed. There  are no spinal abnormalities noted. Normal muscle strength and tone. Neurological: No gross motor or sensory  deficits noted, affect appropriate, oriented to time, person and place.   __    Medications added today by the physician:     IMPRESSIONS:  1. Syncope related to dehydration.  2. Chronic atrial fibrillation not on anticoagulation by personal choice.  3. Advanced age.  4. Hypertension.     RECOMMENDATIONS:  1. Continue current medications.  2. If Cynthia Kim has subsequent symptoms will consider long term monitor. Alternatively I will   see her in six months.  3. Cynthia Kim is  stable from a cardiac standpoint.    Rogelio Seen, MD, [facility]    JJ/tutjm/ds    cc: DODD A. SIMS MD    SJ   ____________________________  Christianne Dolin   Return Visit 30 MIN 6 months  12 Lead ECG Today

## 2020-10-13 NOTE — Progress Notes (Signed)
[facility]  [address] [place], [place] [address]     Cynthia Kim    Date of Visit: 03/29/2017   Date of Birth: September 27, 1931  Age: 87 yrs.  Medical  Record Number: [id number]  __  CURRENT DIAGNOSES     1. Persistent atrial fibrillation, I48.1   2. Pre-op cardiovascular examination, Z01.810  3. Syncope and collapse, R55  4. Abnormal electrocardiogram [ECG] [EKG], R94.31  __  ALLERGIES     NKA  __  MEDICATIONS     1. Aspirin 325 mg Tablet, 1 p.o. q.d.  2. digoxin 125 mcg tablet,  1 po qd  3. naproxen 250 mg tablet, PRN  4. Toprol XL 100 mg tablet,extended release, 1 p.o. q.d.  5. Zoloft 25 mg tablet, 1 po qd  __  CHIEF COMPLAINT/REASON FOR VISIT   Followup of Persistent atrial fibrillation  __  HISTORY OF PRESENT ILLNESS  Cynthia Kim returns for follow up. She is an 87 year old woman with  a history of chronic atrial fibrillation and a personal choice not to take anticoagulation. Since our last visit she had an echocardiogram that showed biatrial dilatation, borderline pulmonary hypertension and no change from prior studies. She opted against  going through carpal tunnel syndrome. She is doing well. She continues to work on a daily basis. She denies any chest pain, shortness of breath or neurological symptoms.   __  PAST HISTORY      Past Medical Illnesses: No previous history of significant medical illnesses.;  Past Cardiac Illnesses : Atrial fibrillation, Palpitations; Infectious Diseases: Measles Rubella-German, Chicken Pox; Surgical  Procedures: Breast Biopsy, Hysterectomy; Trauma History: No previous history of significant trauma.;  Cardiology Procedures-Invasive: No previous interventional or invasive cardiology procedures.; Cardiology Procedures-Noninvasive : Echocardiogram May 2006, Treadmill Dual Isotope -Normal June 2006, Treadmill Stress Test December 2008, Echocardiogram January 2008, Echocardiogram March 2013; Left Ventricular Ejection Fraction : LVEF of 65.8% documented via  echocardiogram on 09/04/2013  ___  FAMILY HISTORY   MaternalAunt -- Hypertension  Sister -- Hypertension     __  CARDIAC RISK FACTORS     Tobacco Abuse: has never used tobacco;  Family History of Heart Disease: negative; Hyperlipidemia: negative;  Hypertension: negative;  Diabetes Mellitus: negative;  Prior History of Heart Disease: negative; Obesity: negative;  Sedentary Life Style:negative; UEA:VWUJWJXB; Menopausal :positive  __  SOCIAL HISTORY    Alcohol Use : Does not use alcohol; Smoking: Does not smoke; Never smoker ([id number]); Diet : Regular diet without modifications and Caffeine use-1-2 per day; Exercise: Exercises regularly five times a week and cycling five times a week;    __  PHYSICAL EXAMINATION    Vital Signs:   Blood Pressure:  114/80 Sitting, Left arm, regular cuff  114/80 Sitting, Right arm, regular cuff    Weight:  120.00 lbs.  Height: 64"  BMI: 20    Pulse: 84/min. ekg       Constitutional: Cooperative,  alert and oriented,well developed, well nourished, in no acute distress. Skin: Warm and dry to touch, no apparent skin lesions, or masses noted.  Head: Normocephalic, normal hair pattern, no masses or tenderness Eyes : EOMS Intact, PERRL, conjunctivae and lids unremarkable. Funduscopic exam and visual fields not performed. ENT:  Ears, Nose and throat reveal no gross abnormalities. No pallor or cyanosis. Dentition good. Neck: No  palpable masses or adenopathy, no thyromegaly, no JVD, carotid pulses are full and equal bilaterally without bruits. Chest: Normal symmetry, no tenderness  to palpation, normal respiratory excursion,  no intercostal retraction, no use of accessory muscles, normal diaphragmatic excursion, clear to auscultation and percussion. Cardiac : Irregularly irregular rhythm with variable 1st heart sound, normal 2nd heart sound Abdomen: Abdomen soft, bowel sounds normoactive, no masses, no hepatosplenomegaly,  non-tender, no bruits Peripheral Pulses: The femoral, popliteal, dorsalis  pedis, and posterior tibial  pulses are full and equal bilaterally with no bruits auscultated. Extremities/Back: No deformities, clubbing, cyanosis, erythema or edema observed. There  are no spinal abnormalities noted. Normal muscle strength and tone. Neurological: No gross motor or sensory  deficits noted, affect appropriate, oriented to time, person and place.   __    Medications added today by the physician:       IMPRESSIONS:  Chronic atrial fibrillation with a personal choice not to take anticoagulation.    PLAN:   Ms. Klatt appears stable from a cardiovascular standpoint. No changes at this time. Barring any changes in symptoms, I will see her in six months.    Rogelio Seen, M.D., F.A.C.C.    JJ/tuld     cc: DODD A. SIMS MD    MG   ____________________________  TODAYS ORDERS  12 Lead ECG Today  Return Visit 15 MIN 6 months

## 2020-10-13 NOTE — Progress Notes (Signed)
[facility]  [address] [place], [place] [address]     TELEMEDICINE VISIT       Cynthia, Kim    Date of Visit: 10/12/2018   Date of Birth: 09-15-1931  Age: 87 yrs.  Medical  Record Number: [id number]  __  CURRENT DIAGNOSES     1. Longstanding persistent atrial fibrillation,  I48.11  2. Pre-op cardiovascular examination, Z01.810  3. Syncope and collapse, R55  4. Abnormal electrocardiogram [ECG] [EKG], R94.31  __  ALLERGIES     NKA  __  MEDICATIONS     1. Aspirin 325 mg Tablet, 1 p.o. q.d.  2. Zoloft 25 mg tablet,  1 po qd  3. Toprol XL 100 mg tablet,extended release, 1 p.o. q.d.  4. digoxin 125 mcg (0.125 mg) tablet, 1 po qd  5. Vitamin D3 25 mcg (1,000 unit) tablet, 1 po qd  6. Flonase Allergy Relief 50 mcg/actuation nasal spray,suspension, PRN   __  CHIEF COMPLAINT/REASON FOR VISIT  Followup of Persistent atrial fibrillation and Followup of Syncope and collapse  __   HISTORY OF PRESENT ILLNESS  The visit today was conducted via telemedicine due to COVID-19 precautions. The patient was in their home and was informed and gave verbal consent to proceed. Cynthia Kim is  seen as a televisit, secondary to the coronavirus epidemic.     Cynthia Kim is an 87 year old woman with persistent atrial fibrillation with personal choice not to take anticoagulation. She has a history of remote syncope without recurrence.      Last echocardiogram was in 2018: Preserved left ventricular function, borderline pulmonary hypertension, and mild mitral regurgitation; biatrial enlargement was noted.    She is doing well at home without any chest pain or shortness of breath.  No subsequent syncope. She denies any neurological symptoms.   __  PAST HISTORY     Past  Medical Illnesses: No previous history of significant medical illnesses.;  Past Cardiac Illnesses : Atrial fibrillation, Palpitations; Infectious Diseases: Measles Rubella-German, Chicken Pox; Surgical  Procedures: Breast Biopsy, Hysterectomy; Trauma History: No  previous history of significant trauma.;  Cardiology Procedures-Invasive: No previous interventional or invasive cardiology procedures.; Cardiology Procedures-Noninvasive : Echocardiogram May 2006, Treadmill Dual Isotope -Normal June 2006, Treadmill Stress Test December 2008, Echocardiogram January 2008, Echocardiogram March 2013; Left Ventricular Ejection Fraction : LVEF of 65.8% documented via echocardiogram on 09/04/2013  ___  FAMILY HISTORY  MaternalAunt  -- Hypertension  Sister -- Hypertension    __  CARDIAC RISK FACTORS      Tobacco Abuse: has never used tobacco; Family History of Heart Disease: negative;  Hyperlipidemia: negative; Hypertension: negative;   Diabetes Mellitus: negative; Prior History of Heart Disease: negative;  Obesity: negative; Sedentary Life Style:negative;  UEA:VWUJWJXB; Menopausal:positive  __   SOCIAL HISTORY    Alcohol Use: Does not use alcohol;  Smoking: Does not smoke; Never smoker ([id number]); Diet: Regular diet without modifications and Caffeine  use-1-2 per day; Exercise: Exercises regularly five times a week and cycling five times a week;   __   PHYSICAL EXAMINATION    Vital Signs:  Blood Pressure:  135/75     Weight: 120.00 lbs.  Height: 64"  BMI:  20.60       Constitutional: Cooperative, alert and oriented,well developed, well nourished, in no acute distress.  Skin: Warm and dry to touch, no apparent skin lesions, or masses noted. Head: Normocephalic, normal  hair pattern, no masses or tenderness Eyes: EOMS Intact, PERRL, conjunctivae and lids unremarkable. Funduscopic  exam and visual fields not performed.  ENT: Ears, Nose and throat reveal no gross abnormalities. No pallor or cyanosis. Dentition good. Neck : No palpable masses or adenopathy, no thyromegaly, no JVD, carotid pulses are full and equal bilaterally without bruits. Chest: Normal symmetry, no  tenderness to palpation, normal respiratory excursion, no intercostal retraction, no use of accessory muscles, normal  diaphragmatic excursion, clear to auscultation and percussion. Cardiac : Irregularly irregular rhythm with variable 1st heart sound, normal 2nd heart sound Abdomen: Abdomen soft, bowel sounds normoactive, no masses, no hepatosplenomegaly,  non-tender, no bruits Peripheral Pulses: The femoral, popliteal, dorsalis pedis, and posterior tibial pulses are full and equal bilaterally with no bruits  auscultated. Extremities/Back: No deformities, clubbing, cyanosis, erythema or edema observed. There are no spinal abnormalities noted. Normal muscle  strength and tone. Neurological: No gross motor or sensory deficits noted, affect appropriate, oriented to time, person and place.   __     Medications added today by the physician:      IMPRESSIONS:  1. Persistent atrial fibrillation with CHADS-vascular score of 3.0, with personal choice not to take anticoagulation.   2. Remote syncope.   3. Preserved left ventricular  function, borderline pulmonary hypertension on echocardiogram in 2018.     PLANS:  1. Ms. Siebels is stable from a cardiovascular standpoint.   2. We will see her again in six months, unless she develops symptoms in the interim.      Rogelio Seen, MD, [facility]    JJ/tutbh    cc: DODD A. SIMS MD  ____________________________  Christianne Dolin  Return Visit 15 MIN 6 months

## 2020-10-13 NOTE — Progress Notes (Signed)
[facility] OFFICE  [address] [place], [place] [address]     Cynthia Kim    Date of Visit: 08/26/2016   Date of Birth: July 31, 1931  Age: 87 yrs.  Medical  Record Number: [id number]  __  CURRENT DIAGNOSES     1. Persistent atrial fibrillation, I48.1   2. Syncope and collapse, R55  3. Pre-op cardiovascular examination, Z01.810  4. Abnormal electrocardiogram [ECG] [EKG], R94.31  __  ALLERGIES     NKA  __  MEDICATIONS     1. Aspirin 325 mg Tablet, 1 p.o. q.d.  2. Zoloft 25 mg tablet,  1 po qd  3. Toprol XL 100 mg tablet,extended release, 1 p.o. q.d.  4. naproxen 250 mg tablet, PRN  5. digoxin 125 mcg tablet, 1 po qd  __  CHIEF COMPLAINT/REASON FOR VISIT   Followup of Persistent atrial fibrillation, Followup of Syncope and collapse and Pre-Operative Cardiovascular Evaluation  __  HISTORY OF PRESENT ILLNESS   [name] returns for follow-up. She is an 87 year old woman with chronic atrial fibrillation, personal choice not to take anticoagulation, as well as hypertension. She had a syncopal episode over the summer thought to be due to dehydration. There has  been no recurrence. She is considering carpal tunnel surgery. We are asked to provide preop risk stratification. She denies any chest pain, shortness of breath. She is fairly active, able to walk up two flights of stairs without any symptoms.     She had indicated she had an echocardiogram during her hospitalization for syncope, however, my review of the hospital chart shows that none was performed.  __  PAST HISTORY      Past Medical Illnesses: No previous history of significant medical illnesses.;  Past Cardiac Illnesses : Atrial fibrillation, Palpitations; Infectious Diseases: Measles Rubella-German, Chicken Pox; Surgical  Procedures: Breast Biopsy, Hysterectomy; Trauma History: No previous history of significant trauma.;  Cardiology Procedures-Invasive: No previous interventional or invasive cardiology procedures.; Cardiology  Procedures-Noninvasive : Echocardiogram May 2006, Treadmill Dual Isotope -Normal June 2006, Treadmill Stress Test December 2008, Echocardiogram January 2008, Echocardiogram March 2013; Left Ventricular Ejection Fraction : LVEF of 65.8% documented via echocardiogram on 09/04/2013  ___  FAMILY HISTORY   MaternalAunt -- Hypertension  Sister -- Hypertension     __  CARDIAC RISK FACTORS     Tobacco Abuse: has never used tobacco;  Family History of Heart Disease: negative; Hyperlipidemia: negative;  Hypertension: negative;  Diabetes Mellitus: negative;  Prior History of Heart Disease: negative; Obesity: negative;  Sedentary Life Style:negative; UEA:VWUJWJXB; Menopausal :positive  __  SOCIAL HISTORY    Alcohol Use : Does not use alcohol; Smoking: Does not smoke; Never smoker ([id number]); Diet : Regular diet without modifications and Caffeine use-1-2 per day; Exercise: Exercises regularly five times a week and cycling five times a week;    __  PHYSICAL EXAMINATION    Vital Signs:   Blood Pressure:  120/70 Sitting, Left arm, regular cuff  120/74 Sitting, Right arm, regular cuff    Weight:  116.00 lbs.  Height: 64"  BMI: 20    Pulse: 92/min.   Respirations: 14/min.        Constitutional: Cooperative, alert and oriented,well developed, well nourished, in no acute distress. Skin:  Warm and dry to touch, no apparent skin lesions, or masses noted. Head: Normocephalic, normal hair pattern,  no masses or tenderness Eyes: EOMS Intact, PERRL, conjunctivae and lids unremarkable. Funduscopic exam and visual fields not performed.  ENT: Ears, Nose and throat reveal  no gross abnormalities. No pallor or cyanosis. Dentition good. Neck : No palpable masses or adenopathy, no thyromegaly, no JVD, carotid pulses are full and equal bilaterally without bruits. Chest : Normal symmetry, no tenderness to palpation, normal respiratory excursion, no intercostal retraction, no use of accessory muscles, normal diaphragmatic excursion, clear to  auscultation and percussion.  Cardiac: Irregularly irregular rhythm with variable 1st heart sound, normal 2nd heart sound Abdomen: Abdomen  soft, bowel sounds normoactive, no masses, no hepatosplenomegaly, non-tender, no bruits Peripheral Pulses:  The femoral, popliteal, dorsalis pedis, and posterior tibial pulses are full and equal bilaterally with no bruits auscultated. Extremities/Back: No deformities,  clubbing, cyanosis, erythema or edema observed. There are no spinal abnormalities noted. Normal muscle strength and tone. Neurological:  No gross motor or sensory deficits noted, affect appropriate, oriented to time, person and place.   __    Medications added today by the physician:   digoxin 125 mcg tablet, 1 po qd, 90      IMPRESSIONS:  1. Chronic atrial fibrillation.  2. Hypertension.  3. Pending carpal tunnel surgery.     RECOMMENDATION:  Check echocardiogram. If that is unremarkable, she exercises greater than four minutes without any symptoms, I would consider her an acceptable risk to proceed with a low risk procedure  from a cardiovascular standpoint. If any more extensive surgery is planned, would consider a Lexiscan study prior to proceeding.    Thank you for allowing me to participate in her care. Feel free to contact me with any questions.    Rogelio Seen, MD, [facility]    JJ/tubbh    cc: DODD A. SIMS MD     AP  ____________________________  TODAYS ORDERS  2D, color flow, doppler First Available  Return Visit 15 MIN 6 months  12 Lead ECG Today

## 2020-10-13 NOTE — Progress Notes (Signed)
[facility] OFFICE  [address] [place], [place] [address]     Cynthia Kim    Date of Visit: 10/12/2017   Date of Birth: 1931-12-31  Age: 87 yrs.  Medical  Record Number: [id number]  __  CURRENT DIAGNOSES     1. Persistent atrial fibrillation, I48.1   2. Pre-op cardiovascular examination, Z01.810  3. Syncope and collapse, R55  4. Abnormal electrocardiogram [ECG] [EKG], R94.31  __  ALLERGIES     NKA  __  MEDICATIONS     1. Aspirin 325 mg Tablet, 1 p.o. q.d.  2. digoxin 125 mcg tablet,  1 po qd  3. naproxen 250 mg tablet, PRN  4. Toprol XL 100 mg tablet,extended release, 1 p.o. q.d.  5. Zoloft 25 mg tablet, 1 po qd  __  CHIEF COMPLAINT/REASON FOR VISIT   Followup of Persistent atrial fibrillation and Followup of Syncope and collapse  __  HISTORY OF PRESENT ILLNESS  Cynthia Kim returns for follow up.  She is an 87 year old woman with a history of chronic atrial fibrillation with a personal choice not to take anticoagulation. She had a history of remote syncope without recurrence that happened in the setting of dehydration. She is doing well and is  active, working in the yard without any chest pain or shortness of breath. She denies any neurological symptoms.   __  PAST HISTORY      Past Medical Illnesses: No previous history of significant medical illnesses.;  Past Cardiac Illnesses : Atrial fibrillation, Palpitations; Infectious Diseases: Measles Rubella-German, Chicken Pox; Surgical  Procedures: Breast Biopsy, Hysterectomy; Trauma History: No previous history of significant trauma.;  Cardiology Procedures-Invasive: No previous interventional or invasive cardiology procedures.; Cardiology Procedures-Noninvasive : Echocardiogram May 2006, Treadmill Dual Isotope -Normal June 2006, Treadmill Stress Test December 2008, Echocardiogram January 2008, Echocardiogram March 2013; Left Ventricular Ejection Fraction : LVEF of 65.8% documented via echocardiogram on 09/04/2013  ___  FAMILY HISTORY   MaternalAunt --  Hypertension  Sister -- Hypertension     __  CARDIAC RISK FACTORS     Tobacco Abuse: has never used tobacco;  Family History of Heart Disease: negative; Hyperlipidemia: negative;  Hypertension: negative;  Diabetes Mellitus: negative;  Prior History of Heart Disease: negative; Obesity: negative;  Sedentary Life Style:negative; UEA:[id number]; Menopausal :positive  __  SOCIAL HISTORY    Alcohol Use : Does not use alcohol; Smoking: Does not smoke; Never smoker ([id number]); Diet : Regular diet without modifications and Caffeine use-1-2 per day; Exercise: Exercises regularly five times a week and cycling five times a week;    __  PHYSICAL EXAMINATION    Vital Signs:   Blood Pressure:  134/78 Sitting, Left arm, regular cuff    Weight: 117.00 lbs.   Height: 64"  BMI: 20   Pulse:  84/min.       Constitutional: Cooperative, alert and oriented,well developed, well nourished,  in no acute distress. Skin: Warm and dry to touch, no apparent skin lesions, or masses noted. Head : Normocephalic, normal hair pattern, no masses or tenderness Eyes: EOMS Intact, PERRL, conjunctivae and  lids unremarkable. Funduscopic exam and visual fields not performed. ENT: Ears, Nose and throat reveal  no gross abnormalities. No pallor or cyanosis. Dentition good. Neck: No palpable masses or adenopathy,  no thyromegaly, no JVD, carotid pulses are full and equal bilaterally without bruits. Chest: Normal symmetry, no tenderness to palpation, normal respiratory  excursion, no intercostal retraction, no use of accessory muscles, normal diaphragmatic excursion, clear to auscultation and  percussion. Cardiac: Irregularly  irregular rhythm with variable 1st heart sound, normal 2nd heart sound Abdomen: Abdomen soft, bowel sounds normoactive, no masses, no hepatosplenomegaly,  non-tender, no bruits Peripheral Pulses: The femoral, popliteal, dorsalis pedis, and posterior tibial  pulses are full and equal bilaterally with no bruits auscultated. Extremities/Back:  No deformities, clubbing, cyanosis, erythema or edema observed. There  are no spinal abnormalities noted. Normal muscle strength and tone. Neurological: No gross motor or sensory  deficits noted, affect appropriate, oriented to time, person and place.   __    Medications added today by the physician:       IMPRESSIONS:  1. Chronic atrial fibrillation.  2. Remote syncope without recurrence.     PLAN :  1. Ms. Balzarini is stable. She understands the risks of not taking anticoagulation.  2. Barring any changes, I will see her again in six months.     Rogelio Seen, M.D., F.A.C.C.     JJ/tubks     cc: DODD A. SIMS MD    mg   ____________________________  TODAYS ORDERS  12 Lead ECG Today  Return Visit 15 MIN 6 months

## 2020-10-13 NOTE — Progress Notes (Signed)
[facility] OFFICE  [address] [place], [place] [address]           [name]    Date of Visit: 03/23/2019   Date of Birth: 1932-03-04  Age: 87 yrs.  Medical  Record Number: [id number]  __  CURRENT DIAGNOSES     1. Longstanding persistent atrial fibrillation,  I48.11  2. Pre-op cardiovascular examination, Z01.810  3. Syncope and collapse, R55  4. Abnormal electrocardiogram [ECG] [EKG], R94.31  __  ALLERGIES     NKA  __  MEDICATIONS     1. Aspirin 325 mg Tablet, 1 p.o. q.d.  2. Zoloft 25 mg tablet,  1 po qd  3. Toprol XL 100 mg tablet,extended release, 1 p.o. q.d.  4. digoxin 125 mcg (0.125 mg) tablet, 1 po qd  5. Vitamin D3 25 mcg (1,000 unit) tablet, 1 po qd  6. Flonase Allergy Relief 50 mcg/actuation nasal spray,suspension, PRN   7. diclofenac ER 100 mg tablet,extended release 24 hr, PRN  __  CHIEF COMPLAINT/REASON FOR VISIT  Followup of Longstanding persistent atrial fibrillation  and Followup of Syncope and collapse  __  HISTORY OF PRESENT ILLNESS  [name] returns for followup. She is an 87 year old woman with permanent  atrial fibrillation with a personal choice not to take anticoagulation. She is doing well, working in her yard for at least an hour a day without any chest pain or shortness of breath. She had a single episode of syncope over a year ago in the setting  of dehydration, without recurrence.   ___  FAMILY HISTORY  MaternalAunt --  Hypertension  Sister -- Hypertension    __  CARDIAC RISK FACTORS      Tobacco Abuse: has never used tobacco; Family History of Heart Disease: negative;  Hyperlipidemia: negative; Hypertension: negative;   Diabetes Mellitus: negative; Prior History of Heart Disease: negative;  Obesity: negative; Sedentary Life Style:negative; Age :positive; Menopausal:positive  __  SOCIAL HISTORY     Alcohol Use: Does not use alcohol; Smoking: Does not smoke; Never smoker ([id number]);  Diet: Regular diet without modifications and Caffeine use-1-2 per day; Exercise:  Exercises regularly  five times a week and cycling five times a week;   __  REVIEW OF SYSTEMS    General : feels well, no change in exercise tolerance.; Integumentary: Denies any change in hair or nails, rashes, or skin lesions.;  Eyes: Denies diplopia, history of glaucoma or visual field defects.; Ears, Nose, Throat, Mouth: Denies  any hearing loss, epistaxis, hoarseness or difficulty speaking.;Respiratory: Denies dyspnea, cough, wheezing or hemoptysis.;  Cardiovascular: palpitations; Abdominal : Denies ulcer disease, hematochezia or melena.; Musculoskeletal:Denies any history of venous insufficiency, arthritic symptoms or back problems.; Neurological  : Denies any history of recurrent strokes, TIA, or seizure disorder.; Psychiatric: anxiety, stress;  Endocrine: Denies any history of weight change, heat/cold intolerance, polydipsia, or polyuria; Hematologic/Immunologic : seasonal allergies  __  PHYSICAL EXAMINATION    Vital Signs:  Blood Pressure:   124/82 Sitting, Right arm, regular cuff  120/80 Sitting, Left arm, regular cuff    Weight: 122.00 lbs.    Pulse: 89/min.       Constitutional: Cooperative, alert and oriented,well developed, well  nourished, in no acute distress. Skin: Warm and dry to touch, no apparent skin lesions, or masses noted.  Head: Normocephalic, normal hair pattern, no masses or tenderness Eyes: EOMS Intact, PERRL, conjunctivae  and lids unremarkable. Funduscopic exam and visual fields not performed. ENT: Ears, Nose and throat reveal no  gross abnormalities. No pallor or cyanosis.  Dentition good. Neck: No palpable masses or adenopathy, no thyromegaly, no JVD, carotid pulses are full and equal bilaterally without bruits.  Chest: Normal symmetry, no tenderness to palpation, normal respiratory excursion, no intercostal retraction, no use of accessory muscles, normal diaphragmatic excursion, clear to auscultation and percussion.  Cardiac: Irregularly irregular rhythm with variable 1st heart sound,  normal 2nd heart sound Abdomen : Abdomen soft, bowel sounds normoactive, no masses, no hepatosplenomegaly, non-tender, no bruits Peripheral Pulses: The femoral, popliteal, dorsalis  pedis, and posterior tibial pulses are full and equal bilaterally with no bruits auscultated. Extremities/Back: No deformities, clubbing, cyanosis, erythema  or edema observed. There are no spinal abnormalities noted. Normal muscle strength and tone. Neurological: No gross motor or sensory deficits noted,  affect appropriate, oriented to time, person and place.   __    Medications added today by the physician:      ECG:  ECG today is atrial fibrillation at a controlled rate.    Last echocardiogram was February of 2018 and  showed normal left ventricular function, biatrial dilatation, and borderline pulmonary hypertension with right ventricular systolic pressure of 36 mmHg.    IMPRESSIONS:  1. Chronic atrial fibrillation, asymptomatic. Personal choice not to take  anticoagulation.   2. Abnormal echocardiogram as described above.     PLANS:  1. We discussed a repeat echocardiogram. She wants to wait until COVID is better controlled before returning to the office. She is asymptomatic.   2. Barring  any change in symptoms, I will see her in six months.   3. She reiterates her desire not to take anticoagulation.    Rogelio Seen, MD, [facility]    JJ/tutbh    cc: DODD A. SIMS MD  ____________________________  TODAYS ORDERS   12 Lead ECG Today  Return Visit 15 MIN 1 year

## 2020-10-22 ENCOUNTER — Other Ambulatory Visit (INDEPENDENT_AMBULATORY_CARE_PROVIDER_SITE_OTHER): Payer: Self-pay | Admitting: Cardiovascular Disease

## 2020-10-22 ENCOUNTER — Other Ambulatory Visit (INDEPENDENT_AMBULATORY_CARE_PROVIDER_SITE_OTHER): Payer: Self-pay

## 2020-10-22 MED ORDER — DIGOXIN 125 MCG PO TABS
125.0000 ug | ORAL_TABLET | Freq: Every day | ORAL | 2 refills | Status: DC
Start: 2020-10-22 — End: 2020-10-23

## 2020-10-22 NOTE — Telephone Encounter (Addendum)
Refill voicemail: Digoxin 0.125mg  once a day. Chart reviewed- sent to pharmacy on file, [facility].

## 2020-10-23 ENCOUNTER — Other Ambulatory Visit (INDEPENDENT_AMBULATORY_CARE_PROVIDER_SITE_OTHER): Payer: Self-pay

## 2020-10-23 MED ORDER — DIGOXIN 125 MCG PO TABS
125.0000 ug | ORAL_TABLET | Freq: Every day | ORAL | 0 refills | Status: DC
Start: 2020-10-23 — End: 2021-01-26

## 2020-10-23 NOTE — Telephone Encounter (Signed)
Pt's husband Jomarie Longs ([employer]IP[employer]) called requesting [facility] supply [facility] take up to 10 days to deliver. Med reviewed. [facility] to [facility].

## 2021-01-24 ENCOUNTER — Other Ambulatory Visit (INDEPENDENT_AMBULATORY_CARE_PROVIDER_SITE_OTHER): Payer: Self-pay | Admitting: Cardiovascular Disease

## 2021-02-06 ENCOUNTER — Encounter (INDEPENDENT_AMBULATORY_CARE_PROVIDER_SITE_OTHER): Payer: Self-pay | Admitting: Cardiovascular Disease

## 2021-02-06 ENCOUNTER — Ambulatory Visit (INDEPENDENT_AMBULATORY_CARE_PROVIDER_SITE_OTHER): Payer: Medicare Other | Admitting: Cardiovascular Disease

## 2021-02-06 VITALS — BP 122/65 | HR 66 | Ht 63.0 in | Wt 120.0 lb

## 2021-02-06 DIAGNOSIS — I482 Chronic atrial fibrillation, unspecified: Secondary | ICD-10-CM

## 2021-02-06 DIAGNOSIS — I4811 Longstanding persistent atrial fibrillation: Secondary | ICD-10-CM

## 2021-02-06 NOTE — Progress Notes (Signed)
[facility] PROGRESS NOTE    [facility] -CARDIOLOGY  [address]  [place] [address]  Dept: [phone]  Dept Fax: [phone]       Patient Name: Cynthia Kim    Date of Visit:  February 06, 2021  Date of Birth: Nov 20, 1931  AGE: 87 y.o.  Medical Record #: [id number]  Requesting Physician: Hayden Pedro, MD      CHIEF COMPLAINT: 6 Month Follow Up and Atrial Fibrillation      HISTORY OF PRESENT ILLNESS:    She is a pleasant 87 y.o. female who presents today for follow-up.  Patient has a history of atrial fibrillation with a personal choice not to take anticoagulation.  She had an echocardiogram last year which showed biatrial enlargement and moderate pulmonary hypertension she opted not to pursue further evaluation she is asymptomatic active walking without any chest pain or shortness of breath she denies lower extremity edema or palpitations.  She had fallen and broken her nose last year she has had no subsequent issues with falls.  She does have difficulty hearing her husband accompanies her today      PAST MEDICAL HISTORY: She has a past medical history of A-fib, Arthritis, and Atrial fibrillation. She has a past surgical history that includes Hysterectomy.    ALLERGIES:   Allergies   Allergen Reactions    Amoxicillin Diarrhea    Norco [Hydrocodone-Acetaminophen] Other (See Comments)     Pt reports dizziness within 2 hours of taking; taken with small meal.     Other Other (See Comments)     Mosquito bite causes her to have blisters.       MEDICATIONS:   Patient's current medications were reviewed. ONLY Cardiac medications were updated unless others were addressed in assessment and plan.    Current Outpatient Medications:     aspirin EC 325 MG EC tablet, Take 325 mg by mouth daily., Disp: , Rfl:     Azelastine-Fluticasone (DYMISTA NA), by Nasal route., Disp: , Rfl:     Cholecalciferol (VITAMIN D) 1000 UNIT tablet, Take 1,000 Units by  mouth daily., Disp: , Rfl:     digoxin (LANOXIN) 0.125 MG tablet, Take 1 tablet (125 mcg total) by mouth daily, Disp: 90 tablet, Rfl: 1    fluocinonide (LIDEX) 0.05 % external solution, APPLY TO ITCHY AREAS OF SCALP ONE TO TWICE DAILY AS NEEDED FOR UP TO 2 WEEKS AT A TIME, USE AS NEEDED, Disp: , Rfl:     metoprolol (TOPROL-XL) 100 MG 24 hr tablet, Take 1 tablet (100 mg total) by mouth daily., Disp: , Rfl:     sertraline (ZOLOFT) 25 MG tablet, Take 25 mg by mouth daily., Disp: , Rfl:     valACYclovir HCL (VALTREX) 500 MG tablet, Take 500 mg by mouth daily, Disp: , Rfl:      FAMILY HISTORY: family history is not on file.    SOCIAL HISTORY: She reports that she has never smoked. She has never used smokeless tobacco. She reports that she does not drink alcohol and does not use drugs.    PHYSICAL EXAMINATION    Visit Vitals  BP 122/65 (BP Site: Right arm, Patient Position: Sitting, Cuff Size: Small)   Pulse 66   Ht 1.6 m (5\' 3" )   Wt 54.4 kg (120 lb)   BMI 21.26 kg/m       General Appearance:  A well-appearing female in no acute distress.  Skin: Warm and dry to touch, no apparent skin lesions, or masses noted.  Head: Normocephalic, normal hair pattern, no masses or tenderness   Eyes: EOMS Intact, PERRL, conjunctivae and lids unremarkable.  ENT: Ears, Nose and throat reveal no gross abnormalities.  No pallor or cyanosis.  Dentition good.   Neck: JVP normal, no carotid bruit, thyroid not enlarged   Chest: Clear to auscultation bilaterally with good air movement and respiratory effort and no wheezes, rales, or rhonchi   Cardiovascular: Irregular 1 out of 6 systolic ejection murmur abdomen: Soft, nontender, nondistended, with normoactive bowel sounds. No organomegaly.  No pulsatile masses, or bruits.   Extremities: Warm without edema. No clubbing, or cyanosis. All peripheral pulses are full and equal.   Neuro: Alert and oriented x3. No gross motor or sensory deficits noted, affect appropriate.        ECG: Atrial  fibrillation 66 bpm      LABS:   Lab Results   Component Value Date    WBC 7.30 01/17/2016    HGB 12.1 01/17/2016    HCT 36.8 (L) 01/17/2016    PLT 344 01/17/2016     Lab Results   Component Value Date    GLU 97 08/01/2020    BUN 18.0 08/01/2020    CREAT 0.7 08/01/2020    NA 138 08/01/2020    K 4.3 08/01/2020    CL 103 08/01/2020    CO2 29 08/01/2020    AST 15 08/01/2020    ALT 12 08/01/2020     Lab Results   Component Value Date    TSH 5.92 (H) 01/17/2016     Lab Results   Component Value Date    CHOL 187 03/20/2007    TRIG 134 03/20/2007    HDL 59 (L) 03/20/2007    LDL 101 03/20/2007             Most recent echo and nuclear study reviewed.      IMPRESSION:   Cynthia Kim is a 87 y.o. female with the following problems:    Chronic atrial fibrillation  Biatrial enlargement moderate pulmonary hypertension echocardiogram 2021 personal choice not to pursue further evaluation she reiterates that choice today  Personal choice not take anticoagulation      RECOMMENDATIONS:    Cynthia Kim appears compensated she is active without any chest pain or shortness of breath there is no lower extremity edema or other signs of right heart failure.  She and her husband reiterate their desire not to take anticoagulation and not to pursue further evaluation of her pulmonary hypertension.  I therefore will not order a follow-up echocardiogram as it will not change her care    Barring any change in symptoms I will see her in 6 months                                                     Orders Placed This Encounter   Procedures    ECG 12 lead (Normal)    Office Visit ([facility] [place])       No orders of the defined types were placed in this encounter.      SIGNED:    Lysle Rubens, MD          This note was generated by the Dragon speech recognition and may contain errors or  omissions not intended by the user. Grammatical errors, random word insertions, deletions, pronoun errors, and incomplete sentences are occasional consequences of this  technology due to software limitations. Not all errors are caught or corrected. If there are questions or concerns about the content of this note or information contained within the body of this dictation, they should be addressed directly with the author for clarification.

## 2021-02-20 ENCOUNTER — Other Ambulatory Visit: Payer: Self-pay | Admitting: Internal Medicine

## 2021-02-20 DIAGNOSIS — Z1231 Encounter for screening mammogram for malignant neoplasm of breast: Secondary | ICD-10-CM

## 2021-03-05 ENCOUNTER — Ambulatory Visit: Payer: Medicare Other | Attending: Internal Medicine

## 2021-03-05 DIAGNOSIS — Z1231 Encounter for screening mammogram for malignant neoplasm of breast: Secondary | ICD-10-CM | POA: Insufficient documentation

## 2021-03-06 ENCOUNTER — Other Ambulatory Visit: Payer: Self-pay

## 2021-03-06 ENCOUNTER — Ambulatory Visit
Admission: RE | Admit: 2021-03-06 | Discharge: 2021-03-06 | Disposition: A | Discharge status: Home / Self Care | Payer: Self-pay | Source: Ambulatory Visit

## 2021-03-06 DIAGNOSIS — R928 Other abnormal and inconclusive findings on diagnostic imaging of breast: Secondary | ICD-10-CM

## 2021-06-10 ENCOUNTER — Other Ambulatory Visit (INDEPENDENT_AMBULATORY_CARE_PROVIDER_SITE_OTHER): Payer: Self-pay | Admitting: Cardiovascular Disease

## 2021-07-02 ENCOUNTER — Encounter (INDEPENDENT_AMBULATORY_CARE_PROVIDER_SITE_OTHER): Payer: Self-pay

## 2021-07-31 ENCOUNTER — Ambulatory Visit (INDEPENDENT_AMBULATORY_CARE_PROVIDER_SITE_OTHER): Payer: Medicare Other | Admitting: Cardiovascular Disease

## 2021-09-02 ENCOUNTER — Encounter (INDEPENDENT_AMBULATORY_CARE_PROVIDER_SITE_OTHER): Payer: Self-pay | Admitting: Cardiovascular Disease

## 2021-09-10 ENCOUNTER — Ambulatory Visit (INDEPENDENT_AMBULATORY_CARE_PROVIDER_SITE_OTHER): Payer: Medicare Other | Admitting: Cardiovascular Disease

## 2022-05-15 ENCOUNTER — Emergency Department
Admission: EM | Admit: 2022-05-15 | Discharge: 2022-05-15 | Disposition: A | Discharge status: Home / Self Care | Payer: Medicare Other | Attending: Emergency Medicine | Admitting: Emergency Medicine

## 2022-05-15 DIAGNOSIS — W57XXXA Bitten or stung by nonvenomous insect and other nonvenomous arthropods, initial encounter: Secondary | ICD-10-CM | POA: Insufficient documentation

## 2022-05-15 DIAGNOSIS — S60362A Insect bite (nonvenomous) of left thumb, initial encounter: Secondary | ICD-10-CM | POA: Insufficient documentation

## 2022-05-15 MED ORDER — DIPHENHYDRAMINE HCL 25 MG PO CAPS
25.0000 mg | ORAL_CAPSULE | Freq: Once | ORAL | Status: AC
Start: 2022-05-15 — End: 2022-05-15
  Administered 2022-05-15: 25 mg via ORAL
  Filled 2022-05-15: qty 1

## 2022-05-15 NOTE — Discharge Instructions (Addendum)
You may 2 tylenol every 6-8 hours as needed for pain (no more than 12 tablets over today and tomorrow).    Then you can ice your thumb and/or apply the lidocaine you have at home. This will help with pain and swelling.    Further you can take 1 benadryl (diphenhydramine 25mg ) every 6 hours for pain/itchiness/redness.    As your doctor if you have had a tetanus booster shot within the last 10 years. If not you will need this shot.

## 2022-05-15 NOTE — ED Provider Notes (Signed)
EMERGENCY DEPARTMENT HISTORY AND PHYSICAL EXAM    Date: 05/15/2022  Patient Name: Cynthia Kim  Attending Physician:  Kelly Splinter, MD    History of Presenting Illness     Chief Complaint   Patient presents with    Insect Bite     History Provided By: patient with husband  Chief Complaint: wasp sting    Additional History: Cynthia Kim is a 88 y.o. female. Was gardening when she felt a lot of pain and stinging in her left thumb. It wouldn't go away so she looked and saw a small bee/wasp. Managed to get stung over the joint in the thumb and maybe also an area more proximal to this. Husband tried to ice it but she screamed in pain and also couldn't stand topical lidocaine. So he gave her 2 tylenol and brought her in. Pain already better and she can tolerate ice pack here. Didn't try benadryl since she wasn't sure this is a good idea with afib. No troubles breathing/dizziness. Redness in finger already better.     PCP: Hayden Pedro, MD      Current Facility-Administered Medications:     diphenhydrAMINE (BENADRYL) capsule 25 mg, 25 mg, Oral, Once, Joya San, MD    Current Outpatient Medications:     aspirin EC 325 MG EC tablet, Take 325 mg by mouth daily., Disp: , Rfl:     Azelastine-Fluticasone (DYMISTA NA), by Nasal route., Disp: , Rfl:     Cholecalciferol (VITAMIN D) 1000 UNIT tablet, Take 1,000 Units by mouth daily., Disp: , Rfl:     digoxin (LANOXIN) 0.125 MG tablet, TAKE 1 TABLET DAILY, Disp: 90 tablet, Rfl: 3    fluocinonide (LIDEX) 0.05 % external solution, APPLY TO ITCHY AREAS OF SCALP ONE TO TWICE DAILY AS NEEDED FOR UP TO 2 WEEKS AT A TIME, USE AS NEEDED, Disp: , Rfl:     metoprolol (TOPROL-XL) 100 MG 24 hr tablet, Take 1 tablet (100 mg total) by mouth daily., Disp: , Rfl:     sertraline (ZOLOFT) 25 MG tablet, Take 25 mg by mouth daily., Disp: , Rfl:     valACYclovir HCL (VALTREX) 500 MG tablet, Take 500 mg by mouth daily, Disp: , Rfl:     Past Medical History     Past Medical History:    Diagnosis Date    A-fib     Arthritis     Atrial fibrillation      Past Surgical History:   Procedure Laterality Date    HYSTERECTOMY         Family History     History reviewed. No pertinent family history.    Social History     Social History     Socioeconomic History    Marital status: Married     Spouse name: Not on file    Number of children: Not on file    Years of education: Not on file    Highest education level: Not on file   Occupational History    Not on file   Tobacco Use    Smoking status: Never    Smokeless tobacco: Never   Vaping Use    Vaping Use: Never used   Substance and Sexual Activity    Alcohol use: No    Drug use: Never    Sexual activity: Not on file   Other Topics Concern    Not on file   Social History Narrative    ** Merged History Encounter **  Social Determinants of Health     Financial Resource Strain: Not on file   Food Insecurity: Not on file   Transportation Needs: Not on file   Physical Activity: Not on file   Stress: Not on file   Social Connections: Not on file   Intimate Partner Violence: Not on file   Housing Stability: Not on file         Allergies     Allergies   Allergen Reactions    Amoxicillin Diarrhea    Norco [Hydrocodone-Acetaminophen] Other (See Comments)     Pt reports dizziness within 2 hours of taking; taken with small meal.     Other Other (See Comments)     Mosquito bite causes her to have blisters.       Physical Exam     BP 143/82   Pulse 74   Temp 97.7 F (36.5 C) (Oral)   Resp 18   Ht 5\' 4"  (1.626 m)   Wt 55.3 kg   SpO2 97%   BMI 20.94 kg/m   Pulse Oximetry Analysis - Normal 97% On RA    Physical Exam   Constitutional: She appears well-developed and well-nourished. mmm  Head: Normocephalic and atraumatic.   Eyes: No scleral icterus.   Neck: Normal range of motion. Neck supple.   Cardiovascular: Intact distal pulses.    Pulmonary/Chest: Effort normal. No respiratory distress.   Neurological: She is alert and oriented to person, place, and  time.   Skin: Skin is warm and dry.   Psychiatric: She has a normal mood and affect. Her behavior is normal. Judgment and thought content normal.   BUE: arthritic changes to hands.   LUE: there is redness of the thumb's ip joint and no palpable masses. Good flexion and extension of the thumb and no numbness. No extension of the redness to mtp nor more proximally  No resp distress. Clear phonation    Diagnostic Study Results     Labs -  Results       ** No results found for the last 24 hours. **            Radiologic Studies -   Radiology Results (24 Hour)       ** No results found for the last 24 hours. **        .    Doctor's Notes     Throughout the stay in the Emergency Department, questions and concerns surrounding pain control, care plans, diagnostic studies, effects of medications administered or prescribed, and future prognostic dilemmas were assessed and addressed.    VITALS:  Patient Vitals for the past 12 hrs:   BP Temp Pulse Resp   05/15/22 1558 143/82 97.7 F (36.5 C) 74 18     ----------MEDICAL DECISION MAKING----------    I personally reviewed the past medical history, social history, family history, and social determinants of health listed above. All labs and imaging in this ED provider note were reviewed as part of my medical decision making. Any personally interpreted imaging or lab/ekg/monitor/vitals will be noted below (pulse ox interpretation above).     OUR OLD RECORDS: last seen by [place] for a fall on 02/25/20 (had a facial injury)    OUTSIDE RECORDS: seen by [facility] for afib and also toes to ortho and PT for shoulder pains among other things    IMP & PLAN: bee/wasp sting. She isn't certain about her tetanus but believes up to date - will check with pcp. No  palpable mass and good rom finger. No systemic sxs. Patient was not able to tolerate home care by husband so they came in. She is already feeling better and so we will d/c home.    ED MEDICATIONS:   Medications   diphenhydrAMINE (BENADRYL)  capsule 25 mg (has no administration in time range)     _______________________________    4:38 PM - Rx use and side effects, results, home self care, discharge instructions, and return precautions discussed extensively with patient with her husband at bedside. Possibility of evolving illness reviewed. All questions solicited and addressed. Patient is amenable to discharge. Tolerating ice now. Will add benadryl and write out proper use of this and tylenol instructions. No systemic reaction.  _______________________________  Medical DeMedical Decision Makingcision Making  Attestations:    Kelly Splinter, MD is the primary emergency doctor of record.    _______________________________    Diagnosis and Disposition     Final ED Diagnosis:   1. Insect bite of left thumb, initial encounter        Disposition:   ED Disposition       ED Disposition   Discharge    Condition   --    Date/Time   Sat May 15, 2022  4:35 PM    Comment   Ramanda Paules discharge to home/self care.    Condition at disposition: Stable                        Joya San, MD  05/15/22 [phone]

## 2022-06-19 ENCOUNTER — Emergency Department
Admission: EM | Admit: 2022-06-19 | Discharge: 2022-06-20 | Disposition: A | Discharge status: Home / Self Care | Payer: Medicare Other | Attending: Emergency Medicine | Admitting: Emergency Medicine

## 2022-06-19 DIAGNOSIS — R197 Diarrhea, unspecified: Secondary | ICD-10-CM | POA: Insufficient documentation

## 2022-06-19 NOTE — ED Notes (Signed)
Bed: BL22  Expected date:   Expected time:   Means of arrival:   Comments:  207

## 2022-06-20 LAB — COMPREHENSIVE METABOLIC PANEL
ALT: 12 U/L (ref 0–55)
AST (SGOT): 23 U/L (ref 5–41)
Albumin/Globulin Ratio: 1.3 (ref 0.9–2.2)
Albumin: 4.5 g/dL (ref 3.5–5.0)
Alkaline Phosphatase: 83 U/L (ref 37–117)
Anion Gap: 13 (ref 5.0–15.0)
BUN: 10 mg/dL (ref 7.0–21.0)
Bilirubin, Total: 1.6 mg/dL — ABNORMAL HIGH (ref 0.2–1.2)
CO2: 25 mEq/L (ref 17–29)
Calcium: 9.9 mg/dL (ref 7.9–10.2)
Chloride: 100 mEq/L (ref 99–111)
Creatinine: 0.7 mg/dL (ref 0.4–1.0)
Globulin: 3.6 g/dL (ref 2.0–3.6)
Glucose: 99 mg/dL (ref 70–100)
Potassium: 4.3 mEq/L (ref 3.5–5.3)
Protein, Total: 8.1 g/dL (ref 6.0–8.3)
Sodium: 138 mEq/L (ref 135–145)
eGFR: >60 mL/min/{1.73_m2} (ref >=60)

## 2022-06-20 LAB — MAGNESIUM: Magnesium: 2 mg/dL (ref 1.6–2.6)

## 2022-06-20 LAB — CBC AND DIFFERENTIAL
Absolute NRBC: 0 10*3/uL (ref 0.00–0.00)
Basophils Absolute Automated: 0.02 10*3/uL (ref 0.00–0.08)
Basophils Automated: 0.3 %
Eosinophils Absolute Automated: 0.13 10*3/uL (ref 0.00–0.44)
Eosinophils Automated: 1.7 %
Hematocrit: 40.2 % (ref 34.7–43.7)
Hgb: 13.5 g/dL (ref 11.4–14.8)
Immature Granulocytes Absolute: 0.02 10*3/uL (ref 0.00–0.07)
Immature Granulocytes: 0.3 %
Instrument Absolute Neutrophil Count: 4.47 10*3/uL (ref 1.10–6.33)
Lymphocytes Absolute Automated: 2.08 10*3/uL (ref 0.42–3.22)
Lymphocytes Automated: 27.8 %
MCH: 29.4 pg (ref 25.1–33.5)
MCHC: 33.6 g/dL (ref 31.5–35.8)
MCV: 87.6 fL (ref 78.0–96.0)
MPV: 8.8 fL — ABNORMAL LOW (ref 8.9–12.5)
Monocytes Absolute Automated: 0.76 10*3/uL (ref 0.21–0.85)
Monocytes: 10.2 %
Neutrophils Absolute: 4.47 10*3/uL (ref 1.10–6.33)
Neutrophils: 59.7 %
Nucleated RBC: 0 /100 WBC (ref 0.0–0.0)
Platelets: 373 10*3/uL — ABNORMAL HIGH (ref 142–346)
RBC: 4.59 10*6/uL (ref 3.90–5.10)
RDW: 13 % (ref 11–15)
WBC: 7.48 10*3/uL (ref 3.10–9.50)

## 2022-06-20 MED ORDER — SODIUM CHLORIDE 0.9 % IV BOLUS
500.0000 mL | Freq: Once | INTRAVENOUS | Status: AC
Start: 2022-06-20 — End: 2022-06-20
  Administered 2022-06-20: 500 mL via INTRAVENOUS

## 2022-06-20 NOTE — ED Triage Notes (Signed)
Cynthia Kim is a 88 y.o. female BIBA for diarrhea since 12/3. She visited PCP 12/5 and 12/7, labs and EKG were normal, was not given any meds. Denies N/V, pain. Hx of Afib on aspirin.     BP 164/87   Pulse 91   Temp 98 F (36.7 C)   Resp 16   SpO2 100%

## 2022-06-20 NOTE — ED Provider Notes (Signed)
History     Chief Complaint   Patient presents with    Diarrhea     This is a 88 year old female with past medical history of A-fib and arthritis presents for diarrhea.  Patient states that since December 3 she has been having diarrhea.  She has been having watery diarrhea 3-4 times per day.  Does admit to prior lightheadedness.  Patient has been on a liquid diet as per recommendation by her primary care doctor this week.  Denies any fever, nausea, vomiting, abdominal pain, blood in stool, black stool.  Patient does admits to prior diarrhea on and off for the past few years.  Denies any antibiotic use within the past 3 months.    PMH: See HPI  PSH: See nursing notes  Allergies: See nursing notes  Social: Denies alcohol, cigarettes         Past Medical History:   Diagnosis Date    A-fib     Arthritis     Atrial fibrillation        Past Surgical History:   Procedure Laterality Date    HYSTERECTOMY         History reviewed. No pertinent family history.    Social  Social History     Tobacco Use    Smoking status: Never    Smokeless tobacco: Never   Vaping Use    Vaping Use: Never used   Substance Use Topics    Alcohol use: No    Drug use: Never       .     Allergies   Allergen Reactions    Amoxicillin Diarrhea    Norco [Hydrocodone-Acetaminophen] Other (See Comments)     Pt reports dizziness within 2 hours of taking; taken with small meal.     Other Other (See Comments)     Mosquito bite causes her to have blisters.       Home Medications       Med List Status: Complete Set By: Leonia Corona, RN at 06/20/2022 12:13 AM              aspirin EC 325 MG EC tablet     Take 1 tablet (325 mg) by mouth daily     Azelastine-Fluticasone (DYMISTA NA)     by Nasal route.     Cholecalciferol (VITAMIN D) 1000 UNIT tablet     Take 1,000 Units by mouth daily.     digoxin (LANOXIN) 0.125 MG tablet     TAKE 1 TABLET DAILY     fluocinonide (LIDEX) 0.05 % external solution     APPLY TO ITCHY AREAS OF SCALP ONE TO TWICE DAILY AS  NEEDED FOR UP TO 2 WEEKS AT A TIME, USE AS NEEDED     metoprolol (TOPROL-XL) 100 MG 24 hr tablet     Take 1 tablet (100 mg total) by mouth daily.     sertraline (ZOLOFT) 25 MG tablet     Take 1 tablet (25 mg) by mouth daily     valACYclovir HCL (VALTREX) 500 MG tablet     Take 500 mg by mouth daily             Review of Systems  The ROS documented in this emergency department record has been reviewed and confirmed by me. Those systems with pertinent positive or negative responses have been documented in the history of present illness. All other systems are otherwise negative and/or noncontributory.  Physical Exam    BP: 164/87, Heart Rate:  91, Temp: 98 F (36.7 C), Resp Rate: 16, SpO2: 100 %    Physical Exam  Vitals and nursing note reviewed.   Constitutional:       Appearance: She is not ill-appearing or toxic-appearing.   HENT:      Head: Normocephalic and atraumatic.      Mouth/Throat:      Comments: Mildly dry  Eyes:      Extraocular Movements: Extraocular movements intact.      Pupils: Pupils are equal, round, and reactive to light.   Cardiovascular:      Rate and Rhythm: Normal rate.      Pulses: Normal pulses.   Pulmonary:      Effort: Pulmonary effort is normal.      Breath sounds: Normal breath sounds.   Abdominal:      Palpations: Abdomen is soft.      Tenderness: There is no abdominal tenderness.   Musculoskeletal:         General: Normal range of motion.   Skin:     General: Skin is warm.   Neurological:      General: No focal deficit present.      Mental Status: She is alert. Mental status is at baseline.           MDM and ED Course     ED Medication Orders (From admission, onward)      Start Ordered     Status Ordering Provider    06/20/22 0241 06/20/22 0240  sodium chloride 0.9 % bolus 500 mL  Once        Route: Intravenous  Ordered Dose: 500 mL       Last MAR action: Stopped Synthia Fairbank, Marylene Land J               Medical Decision Making  This is a 88 year old female with past medical history of A-fib and  arthritis who presents for diarrhea.  At time presentation, patient's vital signs were stable and within normal limits.  Workup was initiated including blood work and stool tests to evaluate for electrolyte abnormalities.  Patient given small bolus of IV fluid due to persistent diarrhea.  Patient was advised on the importance of stool cultures prior to any antibiotics or other antidiarrheals and she will attempt to give stool sample in the emergency department.    Pulse ox reviewed and is normal at this time.  Patient's old records were reviewed.    Amount and/or Complexity of Data Reviewed  Labs: ordered. Decision-making details documented in ED Course.          ED Course as of 06/22/22 1559   Sun Jun 20, 2022   0211 Magnesium: 2.0 [AD]   0211 WBC: 7.48 [AD]   0211 Hemoglobin: 13.5 [AD]   0211 Hematocrit: 40.2 [AD]   0211 Platelet Count(!): 373 [AD]   0211 BUN: 10.0 [AD]   0211 Creatinine: 0.7 [AD]   0211 Sodium: 138 [AD]   0211 Potassium: 4.3 [AD]   0211 AST: 23 [AD]   0211 ALT: 12 [AD]   0211 Alkaline Phosphatase: 83 [AD]   0211 Bilirubin Total(!): 1.6 [AD]   0433 On reevaluation, patient otherwise appears resting comfortably.  She and family were updated on results and plan for discharge home.  Stool sample which was provided was not able to be used as it was contaminated with urine.  Patient was not able to provide additional stool sample in the emergency department.  Advised to monitor for any new  or worsening symptoms and follow-up with gastroenterologist as well as primary care doctor for stool tests.  Advised to advance diet as tolerated.  Upon time of discharge, patient was in no worsening distress, ambulating at baseline and tolerating oral intake.  Patient discharged home with follow-up [AD]      ED Course User Index  [AD] Emma Schupp, Etta Quill, MD             Procedures    Clinical Impression & Disposition     Clinical Impression  Final diagnoses:   Diarrhea, unspecified type        ED Disposition       ED  Disposition   Discharge    Condition   --    Date/Time   Sun Jun 20, 2022  4:40 AM    Comment   Asher Babilonia discharge to home/self care.    Condition at disposition: Stable                  Discharge Medication List as of 06/20/2022  4:40 AM

## 2022-06-25 ENCOUNTER — Telehealth: Payer: Self-pay

## 2022-06-25 NOTE — Telephone Encounter (Signed)
This Encounter is being sent to [facility] due to Patients related symptoms to the following:  (BOLD appropriate symptoms to patient)      Dysphagia/inability to swallow  Cancer Diagnosis or suspected cancer  Need for EUS staging  Jaundice  Referral for urgent ERCP  Rectal bleeding  Ongoing abdominal pain that is not improving or getting worse  Bleeding in the week after a procedure  Abdominal pain in the week after a procedure     For Front Desk:   The patient indicates that their referring MD wanted them to be seen after 3 weeks  Patient was not able to be scheduled within or after 3 weeks    Diarrhea, unspecified type                I understand this encounter is being sent to [facility]astroenterology Navigator for all visits scheduled due to above symptoms:    Patient scheduled at: (Add Office Location here)    Patient Name:Cynthia Kim    Best Preferred Call Back #:Mobile#:[phone]       Home#:[phone] (home)

## 2022-06-29 ENCOUNTER — Other Ambulatory Visit (INDEPENDENT_AMBULATORY_CARE_PROVIDER_SITE_OTHER): Payer: Self-pay | Admitting: Cardiovascular Disease

## 2022-07-20 ENCOUNTER — Telehealth (INDEPENDENT_AMBULATORY_CARE_PROVIDER_SITE_OTHER): Payer: Self-pay

## 2022-07-20 NOTE — Telephone Encounter (Signed)
Called patient to learn of diarrhea symptoms and if an appointment for evaluation was needed. Patient states she had a virus, with dizziness, chest discomfort and diarrhea, which as since resolved. Patient is not interested in scheduling an appointment at this time.

## 2022-08-16 ENCOUNTER — Other Ambulatory Visit: Payer: Self-pay | Admitting: Otolaryngic Allergy

## 2022-08-16 DIAGNOSIS — H818X9 Other disorders of vestibular function, unspecified ear: Secondary | ICD-10-CM

## 2022-08-16 DIAGNOSIS — H8111 Benign paroxysmal vertigo, right ear: Secondary | ICD-10-CM

## 2022-08-16 DIAGNOSIS — R42 Dizziness and giddiness: Secondary | ICD-10-CM

## 2022-08-16 DIAGNOSIS — H903 Sensorineural hearing loss, bilateral: Secondary | ICD-10-CM

## 2022-08-18 ENCOUNTER — Encounter (INDEPENDENT_AMBULATORY_CARE_PROVIDER_SITE_OTHER): Payer: Self-pay | Admitting: Cardiovascular Disease

## 2022-08-20 ENCOUNTER — Encounter (INDEPENDENT_AMBULATORY_CARE_PROVIDER_SITE_OTHER): Payer: Self-pay | Admitting: Cardiovascular Disease

## 2022-08-20 ENCOUNTER — Ambulatory Visit (INDEPENDENT_AMBULATORY_CARE_PROVIDER_SITE_OTHER): Payer: Medicare Other | Admitting: Cardiovascular Disease

## 2022-08-20 ENCOUNTER — Other Ambulatory Visit (INDEPENDENT_AMBULATORY_CARE_PROVIDER_SITE_OTHER): Payer: Medicare Other

## 2022-08-20 VITALS — BP 124/74 | HR 75 | Ht 64.0 in | Wt 120.0 lb

## 2022-08-20 DIAGNOSIS — I4811 Longstanding persistent atrial fibrillation: Secondary | ICD-10-CM

## 2022-08-20 DIAGNOSIS — R42 Dizziness and giddiness: Secondary | ICD-10-CM

## 2022-08-20 NOTE — Progress Notes (Signed)
[facility] PROGRESS NOTE    [facility] [place] [facility] -CARDIOLOGY  [id number]  [place] [address]  Dept: [phone]  Dept Fax: [phone]       Patient Name: Cynthia Kim, Cynthia Kim    Date of Visit:  August 20, 2022  Date of Birth: March 23, 1932  AGE: 89 y.o.  Medical Record #: [id number]  Requesting Physician: Veleta Miners, MD      CHIEF COMPLAINT: Annual Exam and Atrial Fibrillation      HISTORY OF PRESENT ILLNESS:    She is a pleasant 89 y.o. female who presents today for follow-up.  Patient with a history of persistent atrial fibrillation personal choice not take anticoagulation.  December and January she was having a lot of gastrointestinal illnesses mild diarrhea and poor p.o. intakeComprehensive metabolic panel on [id number] showed normal renal function and normal potassium the last couple weeks her GI symptoms have resolved.  She is having persistent dizziness there is a plan for cranial MRI.  She denies any falls syncope or near syncope.  She had an echocardiogram in 2021 that showed severe biatrial enlargement moderate pulmonary hyper tension normal LV function she declined further evaluation at that time.  Her blood pressure is stable but she does have a tachycardic response to postural changes      PAST MEDICAL HISTORY: She has a past medical history of A-fib, Arthritis, and Atrial fibrillation. She has a past surgical history that includes Hysterectomy.    ALLERGIES:   Allergies   Allergen Reactions    Amoxicillin Diarrhea    Norco [Hydrocodone-Acetaminophen] Other (See Comments)     Pt reports dizziness within 2 hours of taking; taken with small meal.     Other Other (See Comments)     Mosquito bite causes her to have blisters.       MEDICATIONS:   Patient's current medications were reviewed. ONLY Cardiac medications were updated unless others were addressed in assessment and plan.    Current Outpatient Medications:     aspirin EC 325  MG EC tablet, Take 1 tablet (325 mg) by mouth daily, Disp: , Rfl:     Azelastine-Fluticasone (DYMISTA NA), by Nasal route., Disp: , Rfl:     Cholecalciferol (VITAMIN D) 1000 UNIT tablet, Take 1 tablet (1,000 Units) by mouth daily, Disp: , Rfl:     digoxin (LANOXIN) 0.125 MG tablet, TAKE 1 TABLET DAILY, Disp: 90 tablet, Rfl: 0    fluocinonide (LIDEX) 0.05 % external solution, APPLY TO ITCHY AREAS OF SCALP ONE TO TWICE DAILY AS NEEDED FOR UP TO 2 WEEKS AT A TIME, USE AS NEEDED, Disp: , Rfl:     metoprolol (TOPROL-XL) 100 MG 24 hr tablet, Take 1 tablet (100 mg total) by mouth daily., Disp: , Rfl:     sertraline (ZOLOFT) 25 MG tablet, Take 1 tablet (25 mg) by mouth daily, Disp: , Rfl:     valACYclovir HCL (VALTREX) 500 MG tablet, Take 1 tablet (500 mg) by mouth daily, Disp: , Rfl:      FAMILY HISTORY: family history is not on file.    SOCIAL HISTORY: She reports that she has never smoked. She has never used smokeless tobacco. She reports that she does not drink alcohol and does not use drugs.    PHYSICAL EXAMINATION    Visit Vitals  BP 124/74 (BP Site: Right arm, Patient Position: Standing, Cuff Size: Medium)   Pulse 75   Ht 1.626 m (  $5' 4"M$ )   Wt 54.4 kg (120 lb)   BMI 20.60 kg/m       Constitutional: Cooperative, alert, no acute distress.  Neck: No carotid bruits, JVP normal.  Cardiac: Irregular 1 out of 6 systolic murmur fourth intercostal space.  Pulmonary: Clear to auscultation bilaterally, no wheezing, no rhonchi, no rales.  Extremities: no edema.  Vascular: +2 pulses in radial artery bilaterally, 2+ pedal pulses bilaterally.    ECG: Atrial fibrillation 77 bpm      LABS REVIEWED:   Lab Results   Component Value Date    WBC 7.48 06/20/2022    HGB 13.5 06/20/2022    HCT 40.2 06/20/2022    PLT 373 (H) 06/20/2022     Lab Results   Component Value Date    GLU 99 06/20/2022    BUN 10.0 06/20/2022    CREAT 0.7 06/20/2022    NA 138 06/20/2022    K 4.3 06/20/2022    CL 100 06/20/2022    CO2 25 06/20/2022    AST 23  06/20/2022    ALT 12 06/20/2022     Lab Results   Component Value Date    MG 2.0 06/20/2022    TSH 5.92 (H) 01/17/2016     Lab Results   Component Value Date    CHOL 187 03/20/2007    TRIG 134 03/20/2007    HDL 59 (L) 03/20/2007    LDL 101 03/20/2007             Most recent echo and nuclear study reviewed.      IMPRESSION:   Cynthia Kim is a 89 y.o. female with the following problems:    Persistent atrial fibrillation personal choice not to take anticoagulation  GI illness now apparently resolved however with persistent intermittent dizziness brain MRI pending  Biatrial enlargement pulmonary hypertension echo 2021 patient had deferred further evaluation at the time  Advanced age      RECOMMENDATIONS:    We reviewed the need for adequate hydration.  As she is on digoxin I like to check a comprehensive metabolic panel as well as a digoxin level we have tried alternatives digoxin in the past but have been unable to control her rate I do have some concerns as she gets older.    Will also repeat echocardiogram and sign 3-day cardiac monitor to see if her heart rate or other arrhythmias are contributing to her dizziness    Will see her again in a month review                                                   Orders Placed This Encounter   Procedures    Comprehensive metabolic panel    Digoxin level    Holter Monitor 7 Day    ECG 12 lead (Normal)    Echo 2D Complete    APP Office Visit ([facility] [place])       No orders of the defined types were placed in this encounter.      SIGNED:    Tarri Glenn, MD          This note was generated by the Dragon speech recognition and may contain errors or omissions not intended by the user. Grammatical errors, random word insertions, deletions, pronoun errors, and incomplete sentences are occasional consequences of this technology  due to software limitations. Not all errors are caught or corrected. If there are questions or concerns about the content of this note or information contained  within the body of this dictation, they should be addressed directly with the author for clarification.

## 2022-08-27 ENCOUNTER — Ambulatory Visit
Admission: RE | Admit: 2022-08-27 | Discharge: 2022-08-27 | Disposition: A | Discharge status: Home / Self Care | Payer: Medicare Other | Source: Ambulatory Visit | Attending: Otolaryngic Allergy | Admitting: Otolaryngic Allergy

## 2022-08-27 DIAGNOSIS — R42 Dizziness and giddiness: Secondary | ICD-10-CM | POA: Insufficient documentation

## 2022-08-27 DIAGNOSIS — H903 Sensorineural hearing loss, bilateral: Secondary | ICD-10-CM

## 2022-08-27 DIAGNOSIS — H818X9 Other disorders of vestibular function, unspecified ear: Secondary | ICD-10-CM

## 2022-08-27 DIAGNOSIS — H8111 Benign paroxysmal vertigo, right ear: Secondary | ICD-10-CM | POA: Insufficient documentation

## 2022-08-27 MED ORDER — GADOTERATE MEGLUMINE 7.5 MMOL/15ML IV SOLN (CLARISCAN)
15.0000 mL | Freq: Once | INTRAVENOUS | Status: AC | PRN
Start: 2022-08-27 — End: 2022-08-27
  Administered 2022-08-27: 15 mL via INTRAVENOUS

## 2022-09-03 ENCOUNTER — Ambulatory Visit
Admission: RE | Admit: 2022-09-03 | Discharge: 2022-09-03 | Disposition: A | Discharge status: Home / Self Care | Payer: Medicare Other | Source: Ambulatory Visit | Attending: Cardiovascular Disease | Admitting: Cardiovascular Disease

## 2022-09-03 ENCOUNTER — Other Ambulatory Visit: Payer: Medicare Other

## 2022-09-03 DIAGNOSIS — I4811 Longstanding persistent atrial fibrillation: Secondary | ICD-10-CM | POA: Insufficient documentation

## 2022-09-03 LAB — COMPREHENSIVE METABOLIC PANEL
ALT: 12 U/L (ref 0–55)
AST (SGOT): 17 U/L (ref 5–41)
Albumin/Globulin Ratio: 1.2 (ref 0.9–2.2)
Albumin: 4.1 g/dL (ref 3.5–5.0)
Alkaline Phosphatase: 93 U/L (ref 37–117)
Anion Gap: 8 (ref 5.0–15.0)
BUN: 18 mg/dL (ref 7.0–21.0)
Bilirubin, Total: 0.4 mg/dL (ref 0.2–1.2)
CO2: 29 mEq/L (ref 17–29)
Calcium: 10 mg/dL (ref 7.9–10.2)
Chloride: 101 mEq/L (ref 99–111)
Creatinine: 0.7 mg/dL (ref 0.4–1.0)
Globulin: 3.3 g/dL (ref 2.0–3.6)
Glucose: 86 mg/dL (ref 70–100)
Potassium: 4.3 mEq/L (ref 3.5–5.3)
Protein, Total: 7.4 g/dL (ref 6.0–8.3)
Sodium: 138 mEq/L (ref 135–145)
eGFR: >60 mL/min/{1.73_m2} (ref >=60)

## 2022-09-03 LAB — HEMOLYSIS INDEX: Hemolysis Index: 5 Index (ref 0–24)

## 2022-09-03 LAB — DIGOXIN LEVEL: Digoxin Level: 0.7 ng/mL (ref 0.5–2.0)

## 2022-09-08 ENCOUNTER — Encounter (INDEPENDENT_AMBULATORY_CARE_PROVIDER_SITE_OTHER): Payer: Self-pay | Admitting: Cardiovascular Disease

## 2022-09-10 ENCOUNTER — Ambulatory Visit
Admission: RE | Admit: 2022-09-10 | Discharge: 2022-09-10 | Disposition: A | Discharge status: Home / Self Care | Payer: Medicare Other | Source: Ambulatory Visit | Attending: Cardiovascular Disease | Admitting: Cardiovascular Disease

## 2022-09-10 DIAGNOSIS — R42 Dizziness and giddiness: Secondary | ICD-10-CM | POA: Insufficient documentation

## 2022-09-10 DIAGNOSIS — I4811 Longstanding persistent atrial fibrillation: Secondary | ICD-10-CM | POA: Insufficient documentation

## 2022-09-13 ENCOUNTER — Encounter (INDEPENDENT_AMBULATORY_CARE_PROVIDER_SITE_OTHER): Payer: Self-pay | Admitting: Cardiovascular Disease

## 2022-09-13 LAB — ECHO ADULT TTE COMPLETE
AV Area (Cont Eq VTI): 2.057
AV Area (Cont Eq VTI): 2.06
AV Mean Gradient: 3
AV Mean Gradient: 4
AV Peak Velocity: 1.24
AV Peak Velocity: 1.3
AV Peak Velocity: 1.32
Ao Root Diameter (2D): 2.9
BP Mod LV Ejection Fraction: 57.1
IVS Diastolic Thickness (2D): 0.9
LA Dimension (2D): 4.8
LA Volume Index (BP A-L): 70
LVID diastole (2D): 4.4
LVID systole (2D): 2.9
MV Area (PHT): 4.215
MV E/e' (Average): 14.728
Prox Ascending Aorta Diameter: 3
RV Basal Diastolic Dimension: 3.9
RV Systolic Pressure: 41
RV Systolic Pressure: 41.44
Summary: NORMAL
Summary: NORMAL
TAPSE: 1.7

## 2022-09-27 ENCOUNTER — Other Ambulatory Visit (INDEPENDENT_AMBULATORY_CARE_PROVIDER_SITE_OTHER): Payer: Self-pay | Admitting: Cardiovascular Disease

## 2022-10-19 ENCOUNTER — Encounter (INDEPENDENT_AMBULATORY_CARE_PROVIDER_SITE_OTHER): Payer: Medicare Other

## 2022-10-26 ENCOUNTER — Other Ambulatory Visit (FREE_STANDING_LABORATORY_FACILITY): Payer: Medicare Other

## 2022-10-26 DIAGNOSIS — R197 Diarrhea, unspecified: Secondary | ICD-10-CM

## 2022-10-29 LAB — MISCELLANEOUS QUEST TEST

## 2022-11-05 ENCOUNTER — Encounter (INDEPENDENT_AMBULATORY_CARE_PROVIDER_SITE_OTHER): Payer: Self-pay | Admitting: Cardiovascular Disease

## 2022-11-05 ENCOUNTER — Ambulatory Visit (INDEPENDENT_AMBULATORY_CARE_PROVIDER_SITE_OTHER): Payer: Medicare Other | Admitting: Cardiovascular Disease

## 2022-11-05 VITALS — BP 120/80 | HR 90 | Resp 14 | Ht 64.0 in | Wt 118.0 lb

## 2022-11-05 DIAGNOSIS — I4811 Longstanding persistent atrial fibrillation: Secondary | ICD-10-CM

## 2022-11-05 DIAGNOSIS — R42 Dizziness and giddiness: Secondary | ICD-10-CM

## 2022-11-05 NOTE — Progress Notes (Signed)
[facility] [facility] PROGRESS NOTE    [facility] -CARDIOLOGY  [address]  [place] [address]  Dept: [phone]  Dept Fax: [phone]       Patient Name: Cynthia Kim, Cynthia Kim    Date of Visit:  November 05, 2022  Date of Birth: 1931-09-05  AGE: 89 y.o.  Medical Record #: [id number]  Requesting Physician: Hayden Pedro, MD      CHIEF COMPLAINT: Follow-up and Results      HISTORY OF PRESENT ILLNESS:    She is a pleasant 89 y.o. female who presents today for follow-up.  Since her last visit she had an echocardiogram which showed massive biatrial enlargement with preserved LV function and severe tricuspid regurgitation mild aortic regurgitation this is unchanged compared to prior studies.  She has persistent atrial fibrillation with a personal choice not to take anticoagulation.  She had an MRI of her brain which interestingly showed no evidence of strokes or embolic events.  She continues to suffer with dizziness.  She wore a 3-day cardiac monitor unfortunately there were technical issues and we are unable to review at this time.  She is engaging in balance therapy      PAST MEDICAL HISTORY: She has a past medical history of A-fib, Arthritis, and Atrial fibrillation. She has a past surgical history that includes Hysterectomy.    ALLERGIES:   Allergies   Allergen Reactions    Amoxicillin Diarrhea    Norco [Hydrocodone-Acetaminophen] Other (See Comments)     Pt reports dizziness within 2 hours of taking; taken with small meal.     Other Other (See Comments)     Mosquito bite causes her to have blisters.       MEDICATIONS:   Patient's current medications were reviewed. ONLY Cardiac medications were updated unless others were addressed in assessment and plan.    Current Outpatient Medications:     aspirin EC 325 MG EC tablet, Take 1 tablet (325 mg) by mouth daily, Disp: , Rfl:     Cholecalciferol (VITAMIN D) 1000 UNIT tablet, Take 1 tablet (1,000 Units)  by mouth daily, Disp: , Rfl:     digoxin (LANOXIN) 0.125 MG tablet, TAKE 1 TABLET DAILY, Disp: 90 tablet, Rfl: 3    fluocinonide (LIDEX) 0.05 % external solution, APPLY TO ITCHY AREAS OF SCALP ONE TO TWICE DAILY AS NEEDED FOR UP TO 2 WEEKS AT A TIME, USE AS NEEDED, Disp: , Rfl:     metoprolol (TOPROL-XL) 100 MG 24 hr tablet, Take 1 tablet (100 mg total) by mouth daily., Disp: , Rfl:     sertraline (ZOLOFT) 25 MG tablet, Take 1 tablet (25 mg) by mouth daily, Disp: , Rfl:     Azelastine-Fluticasone (DYMISTA NA), by Nasal route., Disp: , Rfl:     valACYclovir HCL (VALTREX) 500 MG tablet, Take 1 tablet (500 mg) by mouth daily, Disp: , Rfl:      FAMILY HISTORY: family history is not on file.    SOCIAL HISTORY: She reports that she has never smoked. She has never used smokeless tobacco. She reports that she does not drink alcohol and does not use drugs.    PHYSICAL EXAMINATION    Visit Vitals  BP 120/80 (BP Site: Left arm, Patient Position: Sitting, Cuff Size: Medium)   Pulse 90   Resp 14   Ht 1.626 m (5\' 4" )   Wt 53.5 kg (118 lb)   BMI 20.25 kg/m  Constitutional: Cooperative, alert, no acute distress.  Neck: No carotid bruits, JVP normal.  Cardiac: Irregular pulmonary: Clear to auscultation bilaterally, no wheezing, no rhonchi, no rales.  Extremities: no edema.  Vascular: +2 pulses in radial artery bilaterally, 2+ pedal pulses bilaterally.    ECG:        LABS REVIEWED:   Lab Results   Component Value Date    WBC 7.48 06/20/2022    HGB 13.5 06/20/2022    HCT 40.2 06/20/2022    PLT 373 (H) 06/20/2022     Lab Results   Component Value Date    GLU 86 09/03/2022    BUN 18.0 09/03/2022    CREAT 0.7 09/03/2022    NA 138 09/03/2022    K 4.3 09/03/2022    CL 101 09/03/2022    CO2 29 09/03/2022    AST 17 09/03/2022    ALT 12 09/03/2022     Lab Results   Component Value Date    MG 2.0 06/20/2022    TSH 5.92 (H) 01/17/2016     Lab Results   Component Value Date    CHOL 187 03/20/2007    TRIG 134 03/20/2007    HDL 59 (L)  03/20/2007    LDL 101 03/20/2007             Most recent echo and nuclear study reviewed.      IMPRESSION:   Ms. Rye is a 89 y.o. female with the following problems:    Persistent atrial fibrillation personal choice not to take anticoagulation  Echocardiogram with normal ventricular function massive biatrial enlargement severe tricuspid regurgitation  Dizziness  Advanced age      RECOMMENDATIONS:    We again discussed treatments of atrial fibrillation as well as structural heart evaluation.  She and her husband defer.  She is remarkably asymptomatic clear that her dizziness is related to her underlying cardiac issues    Barring any change in symptoms I will see her in 63-month                                                     Orders Placed This Encounter   Procedures    Office Visit ([facility] [place])       No orders of the defined types were placed in this encounter.      SIGNED:    Lysle Rubens, MD          This note was generated by the Dragon speech recognition and may contain errors or omissions not intended by the user. Grammatical errors, random word insertions, deletions, pronoun errors, and incomplete sentences are occasional consequences of this technology due to software limitations. Not all errors are caught or corrected. If there are questions or concerns about the content of this note or information contained within the body of this dictation, they should be addressed directly with the author for clarification.

## 2022-12-20 ENCOUNTER — Emergency Department: Payer: Medicare Other

## 2022-12-20 ENCOUNTER — Emergency Department
Admission: EM | Admit: 2022-12-20 | Discharge: 2022-12-20 | Disposition: A | Discharge status: Home / Self Care | Payer: Medicare Other | Attending: Emergency Medicine | Admitting: Emergency Medicine

## 2022-12-20 DIAGNOSIS — R42 Dizziness and giddiness: Secondary | ICD-10-CM | POA: Insufficient documentation

## 2022-12-20 LAB — COMPREHENSIVE METABOLIC PANEL
ALT: 11 U/L (ref 0–55)
AST (SGOT): 18 U/L (ref 5–41)
Albumin/Globulin Ratio: 1.3 (ref 0.9–2.2)
Albumin: 3.8 g/dL (ref 3.5–5.0)
Alkaline Phosphatase: 82 U/L (ref 37–117)
Anion Gap: 8 (ref 5.0–15.0)
BUN: 17 mg/dL (ref 7–21)
Bilirubin, Total: 0.6 mg/dL (ref 0.2–1.2)
CO2: 25 mEq/L (ref 17–29)
Calcium: 9.6 mg/dL (ref 7.9–10.2)
Chloride: 100 mEq/L (ref 99–111)
Creatinine: 0.7 mg/dL (ref 0.4–1.0)
GFR: >60 mL/min/{1.73_m2} (ref >=60.0)
Globulin: 3 g/dL (ref 2.0–3.6)
Glucose: 85 mg/dL (ref 70–100)
Potassium: 4.3 mEq/L (ref 3.5–5.3)
Protein, Total: 6.8 g/dL (ref 6.0–8.3)
Sodium: 133 mEq/L — ABNORMAL LOW (ref 135–145)

## 2022-12-20 LAB — LAB USE ONLY - CBC WITH DIFFERENTIAL
Absolute Basophils: 0.02 10*3/uL (ref 0.00–0.08)
Absolute Eosinophils: 0.16 10*3/uL (ref 0.00–0.44)
Absolute Immature Granulocytes: 0.01 10*3/uL (ref 0.00–0.07)
Absolute Lymphocytes: 2.11 10*3/uL (ref 0.42–3.22)
Absolute Monocytes: 0.6 10*3/uL (ref 0.21–0.85)
Absolute Neutrophils: 3.49 10*3/uL (ref 1.10–6.33)
Absolute nRBC: 0 10*3/uL (ref <=0.00)
Basophils %: 0.3 %
Eosinophils %: 2.5 %
Hematocrit: 34.3 % — ABNORMAL LOW (ref 34.7–43.7)
Hemoglobin: 11.3 g/dL — ABNORMAL LOW (ref 11.4–14.8)
Immature Granulocytes %: 0.2 %
Lymphocytes %: 33 %
MCH: 28.8 pg (ref 25.1–33.5)
MCHC: 32.9 g/dL (ref 31.5–35.8)
MCV: 87.3 fL (ref 78.0–96.0)
MPV: 8.3 fL — ABNORMAL LOW (ref 8.9–12.5)
Monocytes %: 9.4 %
Neutrophils %: 54.6 %
Platelet Count: 307 10*3/uL (ref 142–346)
Preliminary Absolute Neutrophil Count: 3.49 10*3/uL (ref 1.10–6.33)
RBC: 3.93 10*6/uL (ref 3.90–5.10)
RDW: 13 % (ref 11–15)
WBC: 6.39 10*3/uL (ref 3.10–9.50)
nRBC %: 0 /100 WBC (ref <=0.0)

## 2022-12-20 LAB — LAB USE ONLY - URINALYSIS WITH REFLEX TO MICROSCOPIC EXAM AND CULTURE
Urine Bilirubin: NEGATIVE
Urine Glucose: NEGATIVE
Urine Ketones: NEGATIVE mg/dL
Urine Leukocyte Esterase: NEGATIVE
Urine Nitrite: NEGATIVE
Urine Protein: NEGATIVE
Urine Specific Gravity: 1.01 (ref 1.001–1.035)
Urine Urobilinogen: NORMAL mg/dL (ref 0.2–2.0)
Urine pH: 6 (ref 5.0–8.0)

## 2022-12-20 LAB — HIGH SENSITIVITY TROPONIN-I: hs Troponin: 5 ng/L (ref <=14.0)

## 2022-12-20 LAB — LAB USE ONLY - URINE GRAY CULTURE HOLD TUBE

## 2022-12-20 MED ORDER — MECLIZINE HCL 12.5 MG PO TABS
25.0000 mg | ORAL_TABLET | Freq: Once | ORAL | Status: AC
Start: 2022-12-20 — End: 2022-12-20
  Administered 2022-12-20: 25 mg via ORAL
  Filled 2022-12-20: qty 2

## 2022-12-20 MED ORDER — MECLIZINE HCL 25 MG PO TABS
25.0000 mg | ORAL_TABLET | Freq: Three times a day (TID) | ORAL | 0 refills | Status: AC | PRN
Start: 2022-12-20 — End: ?

## 2022-12-20 NOTE — ED Provider Notes (Signed)
EMERGENCY DEPARTMENT HISTORY AND PHYSICAL EXAM      History of Presenting Illness:  History Provided By: Patient, patient's husband    Cynthia Kim is a 89 y.o. female with a hx of vertigo, afib co dizziness x 2-3 weeks. Dizzines is a lightheaded sensation, feels like she is going to pass out. States she has had several similar episodes in the past. No chest pain, no sob, no nausea or vomiting.      Past Medical History:   Diagnosis Date    A-fib     Arthritis     Atrial fibrillation       Social Determinants of Health     Tobacco Use: Low Risk  (12/20/2022)    Patient History     Smoking Tobacco Use: Never     Smokeless Tobacco Use: Never     Passive Exposure: Not on file   Alcohol Use: Not on file   Financial Resource Strain: Not on file   Food Insecurity: Not on file   Transportation Needs: Not on file   Physical Activity: Not on file   Stress: Not on file   Social Connections: Not on file   Intimate Partner Violence: Not At Risk (12/20/2022)    Humiliation, Afraid, Rape, and Kick questionnaire     Fear of Current or Ex-Partner: No     Emotionally Abused: No     Physically Abused: No     Sexually Abused: No   Depression: Not on file   Housing Stability: Not on file   Utilities: Not on file   Health Literacy: Not on file       Reviewed and confirmed past medical, surgical, family and social history as documented.    PCP: Hayden Pedro, MD  SPECIALISTS:    Physical Exam:  Vitals:    12/20/22 1609 12/20/22 1619   BP: 138/82    Pulse: 64 72   Resp: 18    Temp: 97.5 F (36.4 C)    TempSrc: Oral    SpO2: 97% 98%   Weight: 56 kg    Height: 5\' 4"  (1.626 m)      Pulse Oximetry Interpretation: Normal     Physical Exam  Vitals and nursing note reviewed.   Constitutional:       General: She is not in acute distress.     Appearance: Normal appearance. She is not ill-appearing, toxic-appearing or diaphoretic.   HENT:      Head: Normocephalic and atraumatic.      Nose: Nose normal. No congestion or rhinorrhea.       Mouth/Throat:      Mouth: Mucous membranes are moist.      Pharynx: No oropharyngeal exudate or posterior oropharyngeal erythema.   Eyes:      General: No scleral icterus.     Extraocular Movements: Extraocular movements intact.      Pupils: Pupils are equal, round, and reactive to light.   Neck:      Vascular: No carotid bruit.   Cardiovascular:      Rate and Rhythm: Normal rate and regular rhythm.      Pulses: Normal pulses.      Heart sounds: Normal heart sounds. No murmur heard.     No friction rub. No gallop.   Pulmonary:      Effort: Pulmonary effort is normal. No respiratory distress.      Breath sounds: Normal breath sounds. No stridor. No wheezing, rhonchi or rales.   Chest:  Chest wall: No tenderness.   Abdominal:      General: Abdomen is flat. There is no distension.      Tenderness: There is no abdominal tenderness. There is no right CVA tenderness or left CVA tenderness.   Musculoskeletal:         General: No tenderness. Normal range of motion.      Cervical back: Normal range of motion and neck supple. No rigidity or tenderness.      Right lower leg: No edema.      Left lower leg: No edema.   Lymphadenopathy:      Cervical: No cervical adenopathy.   Skin:     General: Skin is warm and dry.      Capillary Refill: Capillary refill takes less than 2 seconds.   Neurological:      General: No focal deficit present.      Mental Status: She is alert and oriented to person, place, and time.      Cranial Nerves: No cranial nerve deficit.      Sensory: No sensory deficit.      Motor: No weakness.      Coordination: Coordination normal.      Gait: Gait normal.      Deep Tendon Reflexes: Reflexes normal.   Psychiatric:         Mood and Affect: Mood normal.         Behavior: Behavior normal.         Thought Content: Thought content normal.         Judgment: Judgment normal.          Cardiac Monitor  Rate: 72  Rhythm: Normal Sinus Rhythm     EKG:  Interpreted by the EP.   Time Interpreted: 1603 12/20/22   Rate:  58   Rhythm: Atrial Fibrillation    Interpretation: afib with slow ventricular rate   Comparison:  12/17/15 , afib seen again    Labs:   Abnormal Labs Reviewed - No abnormal labs to display    All labs have been personally reviewed.     Imaging (Xray/CT/US/MRI):      CT Head without Contrast    (Results Pending)     Reviewed and interpreted by me, confirmed by radiology report.    Summary of Prior Notes (date, source and summary):  Previous Notes: Yes. Reviewed Cardiology notes from 11/05/22 that state long standing dizziness, she is starting balance therapy  Previous labs or imaging review: Yes.   HISTORY: Bilateral hearing loss. Benign paroxysmal vertigo of the right  ear.     COMPARISON: Comparison is made to CT of the brain dated 02/25/2020 and MRI  of the brain dated 05/10/2013.     TECHNIQUE: MRI of the brain performed on a 3 Tesla scanner without and with  intravenous contrast. Examination performed per IAC protocol with high  resolution images through the internal auditory canals.      CONTRAST: gadoterate meglumine (CLARISCAN) 7.5 MMOL/15ML injection 15 mL     FINDINGS:   There is diffuse enlargement of the ventricles and sulci, consistent with  age related cerebral volume loss. Confluent periventricular as well as  scattered deep and subcortical T2 hyperintensity is nonspecific, but likely  reflects the sequela of chronic small vessel ischemic disease. No  intracranial hemorrhage, midline shift or mass-effect is present. No  extra-axial fluid collections are seen. No diffusion restriction or  pathologic enhancement is present.     The cerebellopontine angles and internal  auditory canals are unremarkable  in appearance. No pathologic enhancement or mass lesion is demonstrated.  The vestibular cochlear systems are normal in appearance.        The orbital contents are unremarkable. The visualized paranasal sinuses and  mastoids are clear.     IMPRESSION:      1. Unremarkable MR evaluation of the internal  auditory canals and CP  angles. No MR evidence of acute intracranial abnormality.  2. Generalized cerebral volume loss and sequela of chronic microvascular  change.     Neldon Mc, MD  09/02/2022 8:12 AM    Name:     Alysa Chrysler  Age:     70 years  DOB:     [birth year]  Gender:     Female  MRN:     [id number]  Wt:     120 lb  BSA:     1.57 m2  Systolic BP:     540 mmHg  Diastolic BP:     75 mmHg  Technical Quality:     Adequate  Exam Date/Time:     09/10/2022 9:54 AM  Study Site:     AH  Exam Type:     ECHO ADULT TTE COMPLETE     Study Info  Indications      I48.11-Longstanding persistent atrial fibrillation -       R42-Dizziness and giddiness -  Procedure    Complete two-dimensional, color flow and spectral Doppler transthoracic  echocardiogram is performed.     Staff  Sonographer:     Haroldine Laws RDCS  Ordering Physician:     Lysle Rubens MD, [facility]     64 in        Prior Cardiac Studies  Date of Prior Echo:     03-May-2020        Summary    * Very severe biatrial dilation.    * Massive tricuspid regurgitation.    * Mild aortic regurgitation.    * Normal left ventricular ejection fraction.    * Normal right ventricular function by TAPSE.    * By visual assessment, LV EF is 55%.    * Overall, this study is similar to prior 05-03-2020.        Findings  Left Ventricle    Normal left ventricular size. Concentric left ventricular remodeling.  Variable LV EF due to irregular RR interval. Normal left ventricular ejection  fraction. By visual assessment, LV EF is 55%.  Right Ventricle    Normal right ventricular function by TAPSE.        Left Atrium    Very Severe left atrial dilation.     Right Atrium    Very severe right atrial dilation.        Aortic Valve    No aortic stenosis. Mild aortic regurgitation. Tricuspid aortic valve.     Pulmonary Valve    The pulmonic valve is not well visualized. Mild pulmonic regurgitation.     Mitral Valve    No mitral stenosis. Moderate mitral regurgitation.     Tricuspid  Valve    No tricuspid stenosis. Massive tricuspid regurgitation. Extent of TR  precludes assessment of RVSP.        Other Findings    The heart rhythm is irregular.     Aorta    Normal size aortic root.     Inferior Vena Cava    IVC measures 2.3cm. The estimated RA pressure is 8-79mmHg.     Pericardium /  Pleural Effusion    No pericardial effusion.        Measurements  2D Measurements  ----------------------------------------------------------------------  Name                                 Value        Normal  ----------------------------------------------------------------------     Parasternal 2D  ----------------------------------------------------------------------   IVS Diastolic Thickness  (2D)                               0.90 cm     0.60-0.90   LVID Diastole (2D)                 4.40 cm     3.80-5.20   LVID Diastolic Index (2D)        2.8 cm/m2       2.3-3.1    LVIW Diastolic Thickness  (2D)                               1.10 cm     0.60-0.95   LVID Systole (2D)                  2.90 cm     2.20-3.50   LVOT Diameter                      2.00 cm                  LV Fractional Shortening  (2D)                                  34 %         27-45   LA Dimension (2D)                  4.80 cm     2.70-3.80   LA/Ao (2D)                            1.66                 Ao Root Diameter (2D)              2.90 cm     2.70-3.70   Ao Root Diameter (2D) Index     1.85 cm/m2         <2.20   Prox Asc Ao Diameter               3.00 cm     2.30-3.10   Prox Asc Ao Diameter Index      1.91 cm/m2     1.30-1.90      LV Ejection Fraction 2D  ----------------------------------------------------------------------  LV EF (BP MOD)                        57 %         54-74      Apical 2D Dimensions  ----------------------------------------------------------------------   RV Basal Diastolic  Dimension                          3.90 cm  2.50-4.10   LA Volume Index (BP A-L)        69.79 ml/m2   16.00-34.00   RA Area (4C)                      32.80 cm2       <=18.00  M-mode Measurements  ----------------------------------------------------------------------  Name                                 Value        Normal  ----------------------------------------------------------------------     M-Mode  ----------------------------------------------------------------------  TAPSE                              1.70 cm        >=1.60  LVOT/Aortic Valve Doppler Measurements  ----------------------------------------------------------------------  Name                                 Value        Normal  ----------------------------------------------------------------------     LVOT Doppler  ----------------------------------------------------------------------  LVOT VTI                          16.70 cm                 LVOT Peak Velocity                0.83 m/s                 LVOT Peak Gradient               3.00 mmHg                 LVOT Mean Gradient               1.00 mmHg                 LVOT SV                              52 ml                 LVOT SV Index                    33 ml/m\S\2                    AoV Doppler  ----------------------------------------------------------------------  AV VTI                            25.50 cm                 AV Peak Velocity                  1.32 m/s                 AV Peak Gradient                    7 mmHg                 AV Mean Gradient  4 mmHg           <20   AV Area (Cont Eq Vel)             1.98 cm2                 AV Area Index (Cont Eq Vel)     1.3 cm2/m2                 AV Area (Cont Eq VTI)             2.06 cm2        >=3.00   AV Area Index (Cont Eq VTI)     1.31 cm2/m2                 AV V1/V2 Ratio                        0.63                 LV Stroke Volume (4C MOD)         23.00 ml                    AoV Regurgitation Doppler  ----------------------------------------------------------------------  AR Decel Slope                   1.91 m/s2                 AR PHT                               600 ms                 AR Peak Gradient                61.00 mmHg  RVOT/Pulmonic Valve Doppler Measurements  ----------------------------------------------------------------------  Name                                 Value        Normal  ----------------------------------------------------------------------     PV Doppler  ----------------------------------------------------------------------  PV Peak Velocity                  0.80 m/s                 PV Peak Gradient                 3.00 mmHg                    PV Regurgitation Doppler  ----------------------------------------------------------------------   PR Peak End Diastolic  Velocity                          1.20 m/s  Mitral Valve Measurements  ----------------------------------------------------------------------  Name                                 Value        Normal  ----------------------------------------------------------------------     MV Doppler  ----------------------------------------------------------------------  MV E Peak Velocity                1.10 m/s  MV Decel Time                       180 ms                    MV Annular TDI  ----------------------------------------------------------------------  MV Septal e' Velocity             0.05 m/s                 MV E/e' (Septal)                     22.45        <=8.00   MV Lateral e' Velocity            0.16 m/s                 MV E/e' (Lateral)                     7.01                 MV E/e' (Average)                    14.73  Tricuspid Valve Measurements  ----------------------------------------------------------------------  Name                                 Value        Normal  ----------------------------------------------------------------------     TV Doppler  ----------------------------------------------------------------------  TV E Peak Velocity                0.80 m/s                    TV Regurgitation  Doppler  ----------------------------------------------------------------------  TR Peak Velocity                  3.10 m/s                 TR Peak Gradient                   38 mmHg                 RA Pressure                         3 mmHg           <=3   RV Systolic Pressure               41 mmHg           <36  Aorta / Venous Measurements  ----------------------------------------------------------------------  Name                                 Value        Normal  ----------------------------------------------------------------------     IVC/SVC  ----------------------------------------------------------------------  IVC Diameter (Exp 2D)              2.10 cm        <=2.10        Report Signatures  Finalized by Elsie Amis  MD on 09/13/2022 01:24 PM  Preliminary by Haroldine Laws  RDCS on 09/10/2022 11:26 AM  Patient Update Notes:  Medication given in the ED:    ED Medication Orders (From admission, onward)      Start Ordered     Status Ordering Provider    12/20/22 1636 12/20/22 1635  meclizine (ANTIVERT) tablet 25 mg  Once        Route: Oral  Ordered Dose: 25 mg       Last MAR action: Given ,  C          NIH Stroke Score      Flowsheet Row Most Recent Value   NIH Stroke Scale    Interval Baseline admission to ED   1a. Level of Consciousness 0   1b. LOC Questions (age, month) 0   1c. LOC Commands (Open and close eyes, Grip AND release good hand) 0   2. Best Gaze 0   3. Visual Fields 0   4. Facial Palsy 0   5a. Motor Left Arm: (Arms with palm down X 10 seconds. Sitting = arms at 90 degrees. Supine = arms 45 degrees) 0   5b. Motor Right Arm: (Arms with palm down X 10 seconds. Sitting = arms at 90 degrees Supine = arms 45 degrees) 0   Total Motor Arms 0   6a. Motor Left Leg: (Leg elevated X 5 seconds Supine = Leg 30 degrees) 0   6b. Motor Right Leg: (Leg elevated X 5 seconds Supine = Leg 30 degrees) 0   Total Motor Legs 0   7. Limb Ataxia (Finger to nose, heel to shin) 0   8. Sensory  (Sensation or grimace to pin prick on face, arm, trunk, and leg) 0   9. Best language (Describe picture, name items, read sentences from NIHSS booklet) 0   10. Dysarthria (Read list from NIHSS booklet) 0   11. Extinction and Inattention (formerly Neglect) - Test tactile and visual stimulation 0   NIHSS Total 0             Provider Notes: ddx: cva, vertigo, chronic dizziness, acs    Feels improved after meclizine  Neuro intact, ambulating without difficulty  Labs unremarkable, EKG with no acute changes  Patient with intermittent dizziness for several months, neg MRI, echo reviewed  Felt that meclizine helped her, will [place] with rx for meclizine  She will follow up with her pcp and will return tot he the ed if her symptoms persist or worsen.    Considered (meds/labs/imaging/admission): Yes. Considered admission  for MRI, patient neuro intact for same    Prescribed (and considered but not prescribed):   New Prescriptions    MECLIZINE (ANTIVERT) 25 MG TABLET    Take 1 tablet (25 mg) by mouth 3 (three) times daily as needed for Dizziness       Clinical Impression:   1. Dizziness        ED Disposition       ED Disposition   Discharge    Condition   --    Date/Time   Mon Dec 20, 2022  6:56 PM    Comment   Moline Eklund discharge to home/self care.    Condition at disposition: Stable                     CHART OWNERSHIP: Dr. Alycia Rossetti is the primary emergency physician of record.    This note was generated by the Epic EMR system/ Dragon speech recognition and may contain inherent errors or omissions not intended by the user. Grammatical errors, random word  insertions, deletions and pronoun errors  are occasional consequences of this technology due to software limitations. Not all errors are caught or corrected. If there are questions or concerns about the content of this note or information contained within the body of this dictation they should be addressed directly with the author for clarification     Alphonzo Severance,  DO  12/20/22 1901

## 2022-12-22 ENCOUNTER — Encounter (INDEPENDENT_AMBULATORY_CARE_PROVIDER_SITE_OTHER): Payer: Self-pay

## 2022-12-22 LAB — ECG 12-LEAD
Q-T Interval: 392 ms
QRS Duration: 90 ms
QTC Calculation (Bezet): 384 ms
R Axis: 72 degrees
T Axis: 54 degrees
Ventricular Rate: 58 {beats}/min

## 2023-02-11 ENCOUNTER — Inpatient Hospital Stay
Admission: AD | Admit: 2023-02-11 | Discharge: 2023-02-13 | DRG: 177 | Disposition: A | Discharge status: Home Health | Payer: Medicare Other | Source: Other Acute Inpatient Hospital | Attending: Internal Medicine | Admitting: Internal Medicine

## 2023-02-11 ENCOUNTER — Emergency Department
Admission: EM | Admit: 2023-02-11 | Discharge: 2023-02-11 | Payer: Medicare Other | Attending: Emergency Medicine | Admitting: Emergency Medicine

## 2023-02-11 ENCOUNTER — Emergency Department: Payer: Medicare Other

## 2023-02-11 DIAGNOSIS — Z79899 Other long term (current) drug therapy: Secondary | ICD-10-CM

## 2023-02-11 DIAGNOSIS — E871 Hypo-osmolality and hyponatremia: Secondary | ICD-10-CM | POA: Insufficient documentation

## 2023-02-11 DIAGNOSIS — J9811 Atelectasis: Secondary | ICD-10-CM | POA: Diagnosis present

## 2023-02-11 DIAGNOSIS — I517 Cardiomegaly: Secondary | ICD-10-CM | POA: Diagnosis present

## 2023-02-11 DIAGNOSIS — R0902 Hypoxemia: Secondary | ICD-10-CM | POA: Insufficient documentation

## 2023-02-11 DIAGNOSIS — R531 Weakness: Secondary | ICD-10-CM

## 2023-02-11 DIAGNOSIS — U071 COVID-19: Secondary | ICD-10-CM | POA: Insufficient documentation

## 2023-02-11 DIAGNOSIS — G9341 Metabolic encephalopathy: Secondary | ICD-10-CM | POA: Diagnosis present

## 2023-02-11 DIAGNOSIS — Z90710 Acquired absence of both cervix and uterus: Secondary | ICD-10-CM

## 2023-02-11 DIAGNOSIS — I4891 Unspecified atrial fibrillation: Secondary | ICD-10-CM | POA: Diagnosis present

## 2023-02-11 DIAGNOSIS — J9601 Acute respiratory failure with hypoxia: Secondary | ICD-10-CM | POA: Diagnosis present

## 2023-02-11 DIAGNOSIS — E860 Dehydration: Secondary | ICD-10-CM | POA: Diagnosis present

## 2023-02-11 LAB — COVID-19 (SARS-COV-2) & INFLUENZA  A/B, NAA (ROCHE LIAT)
Influenza A RNA: NOT DETECTED
Influenza B RNA: NOT DETECTED
SARS-CoV-2 (COVID-19) RNA: DETECTED — AB

## 2023-02-11 LAB — LAB USE ONLY - CBC WITH DIFFERENTIAL
Absolute Basophils: 0.01 10*3/uL (ref 0.00–0.08)
Absolute Eosinophils: 0.01 10*3/uL (ref 0.00–0.44)
Absolute Immature Granulocytes: 0.02 10*3/uL (ref 0.00–0.07)
Absolute Lymphocytes: 0.78 10*3/uL (ref 0.42–3.22)
Absolute Monocytes: 0.76 10*3/uL (ref 0.21–0.85)
Absolute Neutrophils: 3.95 10*3/uL (ref 1.10–6.33)
Absolute nRBC: 0 10*3/uL (ref <=0.00)
Basophils %: 0.2 %
Eosinophils %: 0.2 %
Hematocrit: 34.2 % — ABNORMAL LOW (ref 34.7–43.7)
Hemoglobin: 11.5 g/dL (ref 11.4–14.8)
Immature Granulocytes %: 0.4 %
Lymphocytes %: 14.1 %
MCH: 28.8 pg (ref 25.1–33.5)
MCHC: 33.6 g/dL (ref 31.5–35.8)
MCV: 85.7 fL (ref 78.0–96.0)
MPV: 9.1 fL (ref 8.9–12.5)
Monocytes %: 13.7 %
Neutrophils %: 71.4 %
Platelet Count: 235 10*3/uL (ref 142–346)
Preliminary Absolute Neutrophil Count: 3.95 10*3/uL (ref 1.10–6.33)
RBC: 3.99 10*6/uL (ref 3.90–5.10)
RDW: 14 % (ref 11–15)
WBC: 5.53 10*3/uL (ref 3.10–9.50)
nRBC %: 0 /100 WBC (ref <=0.0)

## 2023-02-11 LAB — COMPREHENSIVE METABOLIC PANEL
ALT: 19 U/L (ref 0–55)
AST (SGOT): 34 U/L (ref 5–41)
Albumin/Globulin Ratio: 1.1 (ref 0.9–2.2)
Albumin: 3.8 g/dL (ref 3.5–5.0)
Alkaline Phosphatase: 74 U/L (ref 37–117)
Anion Gap: 11 (ref 5.0–15.0)
BUN: 10 mg/dL (ref 7–21)
Bilirubin, Total: 0.6 mg/dL (ref 0.2–1.2)
CO2: 22 mEq/L (ref 17–29)
Calcium: 9 mg/dL (ref 7.9–10.2)
Chloride: 98 mEq/L — ABNORMAL LOW (ref 99–111)
Creatinine: 0.7 mg/dL (ref 0.4–1.0)
GFR: >60 mL/min/{1.73_m2} (ref >=60.0)
Globulin: 3.6 g/dL (ref 2.0–3.6)
Glucose: 120 mg/dL — ABNORMAL HIGH (ref 70–100)
Potassium: 3.8 mEq/L (ref 3.5–5.3)
Protein, Total: 7.4 g/dL (ref 6.0–8.3)
Sodium: 131 mEq/L — ABNORMAL LOW (ref 135–145)

## 2023-02-11 LAB — LACTIC ACID: Whole Blood Lactic Acid: 1.4 mmol/L (ref 0.2–2.0)

## 2023-02-11 LAB — MAGNESIUM: Magnesium: 1.6 mg/dL (ref 1.6–2.6)

## 2023-02-11 MED ORDER — DEXTROSE 10 % IV BOLUS
12.5000 g | INTRAVENOUS | Status: DC | PRN
Start: 2023-02-11 — End: 2023-02-13

## 2023-02-11 MED ORDER — NALOXONE HCL 0.4 MG/ML IJ SOLN (WRAP)
0.2000 mg | INTRAMUSCULAR | Status: DC | PRN
Start: 2023-02-11 — End: 2023-02-13

## 2023-02-11 MED ORDER — FUROSEMIDE 10 MG/ML IJ SOLN
20.0000 mg | Freq: Once | INTRAMUSCULAR | Status: AC
Start: 2023-02-11 — End: 2023-02-11
  Administered 2023-02-11: 20 mg via INTRAVENOUS

## 2023-02-11 MED ORDER — GLUCAGON 1 MG IJ SOLR (WRAP)
1.0000 mg | INTRAMUSCULAR | Status: DC | PRN
Start: 2023-02-11 — End: 2023-02-13

## 2023-02-11 MED ORDER — SALINE SPRAY 0.65 % NA SOLN
2.0000 | NASAL | Status: DC | PRN
Start: 2023-02-11 — End: 2023-02-13

## 2023-02-11 MED ORDER — ONDANSETRON 4 MG PO TBDP
4.0000 mg | ORAL_TABLET | Freq: Four times a day (QID) | ORAL | Status: DC | PRN
Start: 2023-02-11 — End: 2023-02-13

## 2023-02-11 MED ORDER — GLUCOSE 40 % PO GEL (WRAP)
15.0000 g | ORAL | Status: DC | PRN
Start: 2023-02-11 — End: 2023-02-13

## 2023-02-11 MED ORDER — METOPROLOL SUCCINATE ER 50 MG PO TB24
100.0000 mg | ORAL_TABLET | Freq: Every day | ORAL | Status: DC
Start: 2023-02-12 — End: 2023-02-11

## 2023-02-11 MED ORDER — POTASSIUM CHLORIDE CRYS ER 20 MEQ PO TBCR
0.0000 meq | EXTENDED_RELEASE_TABLET | ORAL | Status: DC | PRN
Start: 2023-02-11 — End: 2023-02-13
  Administered 2023-02-12: 40 meq via ORAL
  Filled 2023-02-11: qty 2

## 2023-02-11 MED ORDER — MELATONIN 3 MG PO TABS
3.0000 mg | ORAL_TABLET | Freq: Every evening | ORAL | Status: DC | PRN
Start: 2023-02-11 — End: 2023-02-13

## 2023-02-11 MED ORDER — ENOXAPARIN SODIUM 40 MG/0.4ML IJ SOSY
40.0000 mg | PREFILLED_SYRINGE | Freq: Every day | INTRAMUSCULAR | Status: DC
Start: 2023-02-11 — End: 2023-02-13
  Administered 2023-02-11 – 2023-02-13 (×3): 40 mg via SUBCUTANEOUS
  Filled 2023-02-11 (×3): qty 0.4

## 2023-02-11 MED ORDER — DEXAMETHASONE 4 MG PO TABS
10.0000 mg | ORAL_TABLET | Freq: Once | ORAL | Status: AC
Start: 2023-02-11 — End: 2023-02-11
  Administered 2023-02-11: 10 mg via ORAL
  Filled 2023-02-11: qty 3

## 2023-02-11 MED ORDER — METOPROLOL TARTRATE 50 MG PO TABS
50.0000 mg | ORAL_TABLET | Freq: Two times a day (BID) | ORAL | Status: DC
Start: 2023-02-11 — End: 2023-02-11

## 2023-02-11 MED ORDER — FUROSEMIDE 10 MG/ML IJ SOLN
40.0000 mg | Freq: Once | INTRAMUSCULAR | Status: DC
Start: 2023-02-11 — End: 2023-02-11
  Filled 2023-02-11: qty 4

## 2023-02-11 MED ORDER — ACETAMINOPHEN 325 MG PO TABS
650.0000 mg | ORAL_TABLET | Freq: Three times a day (TID) | ORAL | Status: DC | PRN
Start: 2023-02-11 — End: 2023-02-13

## 2023-02-11 MED ORDER — METOPROLOL SUCCINATE ER 50 MG PO TB24
200.0000 mg | ORAL_TABLET | Freq: Every day | ORAL | Status: DC
Start: 2023-02-12 — End: 2023-02-13
  Administered 2023-02-12: 200 mg via ORAL
  Administered 2023-02-13: 100 mg via ORAL
  Filled 2023-02-11 (×2): qty 4

## 2023-02-11 MED ORDER — METOPROLOL TARTRATE 25 MG PO TABS
25.0000 mg | ORAL_TABLET | Freq: Two times a day (BID) | ORAL | Status: DC
Start: 2023-02-11 — End: 2023-02-11
  Administered 2023-02-11: 25 mg via ORAL
  Filled 2023-02-11: qty 1

## 2023-02-11 MED ORDER — BENZONATATE 100 MG PO CAPS
100.0000 mg | ORAL_CAPSULE | Freq: Three times a day (TID) | ORAL | Status: DC | PRN
Start: 2023-02-11 — End: 2023-02-13

## 2023-02-11 MED ORDER — POTASSIUM CHLORIDE 10 MEQ/100ML IV SOLN
10.0000 meq | INTRAVENOUS | Status: DC | PRN
Start: 2023-02-11 — End: 2023-02-13
  Administered 2023-02-12: 10 meq via INTRAVENOUS
  Filled 2023-02-11: qty 100

## 2023-02-11 MED ORDER — ASPIRIN 325 MG PO TBEC
325.0000 mg | DELAYED_RELEASE_TABLET | Freq: Every day | ORAL | Status: DC
Start: 2023-02-11 — End: 2023-02-13
  Administered 2023-02-11 – 2023-02-13 (×3): 325 mg via ORAL
  Filled 2023-02-11 (×3): qty 1

## 2023-02-11 MED ORDER — ONDANSETRON HCL 4 MG/2ML IJ SOLN
4.0000 mg | Freq: Four times a day (QID) | INTRAMUSCULAR | Status: DC | PRN
Start: 2023-02-11 — End: 2023-02-13

## 2023-02-11 MED ORDER — METOPROLOL TARTRATE 50 MG PO TABS
75.0000 mg | ORAL_TABLET | Freq: Once | ORAL | Status: AC
Start: 2023-02-11 — End: 2023-02-11
  Administered 2023-02-11: 75 mg via ORAL
  Filled 2023-02-11: qty 1

## 2023-02-11 MED ORDER — SODIUM CHLORIDE 0.9 % IV BOLUS
1000.0000 mL | Freq: Once | INTRAVENOUS | Status: AC
Start: 2023-02-11 — End: 2023-02-11
  Administered 2023-02-11: 1000 mL via INTRAVENOUS

## 2023-02-11 MED ORDER — SODIUM CHLORIDE 0.9 % IV SOLN
200.0000 mg | Freq: Once | INTRAVENOUS | Status: AC
Start: 2023-02-11 — End: 2023-02-12
  Administered 2023-02-11: 200 mg via INTRAVENOUS
  Filled 2023-02-11: qty 40

## 2023-02-11 MED ORDER — CARBOXYMETHYLCELLULOSE SODIUM 0.5 % OP SOLN
1.0000 [drp] | Freq: Three times a day (TID) | OPHTHALMIC | Status: DC | PRN
Start: 2023-02-11 — End: 2023-02-13

## 2023-02-11 MED ORDER — BENZOCAINE-MENTHOL MT LOZG (WRAP)
1.0000 | LOZENGE | OROMUCOSAL | Status: DC | PRN
Start: 2023-02-11 — End: 2023-02-13

## 2023-02-11 MED ORDER — POTASSIUM & SODIUM PHOSPHATES 280-160-250 MG PO PACK
2.0000 | PACK | ORAL | Status: DC | PRN
Start: 2023-02-11 — End: 2023-02-13

## 2023-02-11 MED ORDER — PATIENT SUPPLIED NON FORMULARY
1.0000 | Freq: Two times a day (BID) | Status: DC
Start: 2023-02-11 — End: 2023-02-11

## 2023-02-11 MED ORDER — MAGNESIUM SULFATE IN D5W 1-5 GM/100ML-% IV SOLN
1.0000 g | INTRAVENOUS | Status: DC | PRN
Start: 2023-02-11 — End: 2023-02-13
  Administered 2023-02-12 (×2): 1 g via INTRAVENOUS
  Filled 2023-02-11 (×2): qty 100

## 2023-02-11 MED ORDER — DEXAMETHASONE 4 MG PO TABS
6.0000 mg | ORAL_TABLET | Freq: Every day | ORAL | Status: DC
Start: 2023-02-12 — End: 2023-02-13
  Administered 2023-02-12 – 2023-02-13 (×2): 6 mg via ORAL
  Filled 2023-02-11 (×2): qty 2

## 2023-02-11 MED ORDER — SODIUM CHLORIDE 0.9 % IV SOLN
100.0000 mg | Freq: Every day | INTRAVENOUS | Status: DC
Start: 2023-02-12 — End: 2023-02-13
  Administered 2023-02-12: 100 mg via INTRAVENOUS
  Filled 2023-02-11: qty 20

## 2023-02-11 MED ORDER — DIGOXIN 125 MCG PO TABS
125.0000 ug | ORAL_TABLET | Freq: Every day | ORAL | Status: DC
Start: 2023-02-11 — End: 2023-02-13
  Administered 2023-02-11 – 2023-02-13 (×3): 125 ug via ORAL
  Filled 2023-02-11 (×3): qty 1

## 2023-02-11 MED ORDER — DEXTROSE 50 % IV SOLN
12.5000 g | INTRAVENOUS | Status: DC | PRN
Start: 2023-02-11 — End: 2023-02-13

## 2023-02-11 MED ORDER — GUAIFENESIN ER 600 MG PO TB12
600.0000 mg | ORAL_TABLET | Freq: Two times a day (BID) | ORAL | Status: DC
Start: 2023-02-11 — End: 2023-02-13
  Administered 2023-02-11 – 2023-02-13 (×4): 600 mg via ORAL
  Filled 2023-02-11 (×4): qty 1

## 2023-02-11 MED ORDER — SERTRALINE HCL 50 MG PO TABS
25.0000 mg | ORAL_TABLET | Freq: Every day | ORAL | Status: DC
Start: 2023-02-11 — End: 2023-02-13
  Administered 2023-02-11 – 2023-02-13 (×3): 25 mg via ORAL
  Filled 2023-02-11 (×3): qty 1

## 2023-02-11 MED ORDER — RISAQUAD PO CAPS
1.0000 | ORAL_CAPSULE | Freq: Every day | ORAL | Status: DC
Start: 2023-02-11 — End: 2023-02-13
  Administered 2023-02-11 – 2023-02-13 (×3): 1 via ORAL
  Filled 2023-02-11 (×3): qty 1

## 2023-02-11 MED ORDER — ACETAMINOPHEN 500 MG PO TABS
1000.0000 mg | ORAL_TABLET | Freq: Once | ORAL | Status: AC
Start: 2023-02-11 — End: 2023-02-11
  Administered 2023-02-11: 1000 mg via ORAL
  Filled 2023-02-11: qty 2

## 2023-02-11 NOTE — Plan of Care (Signed)
@1600 : pt got transfer from [facility] ED to room [facility] 2114.   Neuro: WDL. AAO x4, follows commands, can moves all extremities but weakness, PEERLA complains of pain.   CV: NSR to sinus tach w/ HR 70's to 120's, SBP: 100's to 140's. Palpable pulses, no edema,  no tele.   Resp: on 2L  clear diminished B/L lungs sounds. Dyspnea 2 exertion.   GI: No BM this shift, Continent. Diet: Regular.   GU: WDL. Continent, external cath,   UO this shift: 500 ml  Skin: old  scars,  otherwise intact.     Pt's daughter is bringing her Covid medication from home, as per the MD order. Handoff given to upcoming RN.     Purposeful rounding and care plan has been performed and documented.     Problem: Compromised Moisture  Goal: Moisture level Interventions  Outcome: Progressing  Flowsheets (Taken 02/11/2023 1632)  Moisture level Interventions: Moisture wicking products, Moisture barrier cream     Problem: Compromised Activity/Mobility  Goal: Activity/Mobility Interventions  Outcome: Progressing  Flowsheets (Taken 02/11/2023 1632)  Activity/Mobility Interventions: Pad bony prominences, TAP Seated positioning system when OOB, Promote PMP, Reposition q 2 hrs / turn clock, Offload heels     Problem: Compromised Friction/Shear  Goal: Friction and Shear Interventions  Outcome: Progressing  Flowsheets (Taken 02/11/2023 1632)  Friction and Shear Interventions: Pad bony prominences, Off load heels, HOB 30 degrees or less unless contraindicated, Consider: TAP seated positioning, Heel foams     Problem: Moderate/High Fall Risk Score >5  Goal: Patient will remain free of falls  Outcome: Progressing  Flowsheets (Taken 02/11/2023 1800)  Moderate Risk (6-13):   MOD-Consider activation of bed alarm if appropriate   MOD-Floor mat at bedside (where available) if appropriate   MOD-Place bedside commode and assistive devices out of sight when not in use   MOD-Utilize diversion activities   MOD-Perform dangle, stand, walk (DSW) prior to mobilization   MOD-Request PT/OT  consult order for patients with gait/mobility impairment   MOD-Use gait belt when appropriate     Problem: Safety  Goal: Patient will be free from injury during hospitalization  Outcome: Progressing  Flowsheets (Taken 02/11/2023 2007)  Patient will be free from injury during hospitalization:   Assess patient's risk for falls and implement fall prevention plan of care per policy   Use appropriate transfer methods   Ensure appropriate safety devices are available at the bedside   Include patient/ family/ care giver in decisions related to safety   Assess for patients risk for elopement and implement Elopement Risk Plan per policy   Provide alternative method of communication if needed (communication boards, writing)   Hourly rounding   Provide and maintain safe environment  Goal: Patient will be free from infection during hospitalization  Outcome: Progressing  Flowsheets (Taken 02/11/2023 2007)  Free from Infection during hospitalization:   Assess and monitor for signs and symptoms of infection   Monitor all insertion sites (i.e. indwelling lines, tubes, urinary catheters, and drains)   Encourage patient and family to use good hand hygiene technique   Monitor lab/diagnostic results     Problem: Pain  Goal: Pain at adequate level as identified by patient  Outcome: Progressing  Flowsheets (Taken 02/11/2023 1632)  Pain at adequate level as identified by patient:   Reassess pain within 30-60 minutes of any procedure/intervention, per Pain Assessment, Intervention, Reassessment (AIR) Cycle   Offer non-pharmacological pain management interventions     Problem: Discharge Barriers  Goal: Patient will  be discharged home or other facility with appropriate resources  Outcome: Progressing  Flowsheets (Taken 02/11/2023 2007)  Discharge to home or other facility with appropriate resources:   Provide appropriate patient education   Provide information on available health resources     Problem: Fluid and Electrolyte Imbalance/  Endocrine  Goal: Fluid and electrolyte balance are achieved/maintained  Outcome: Progressing  Flowsheets (Taken 02/11/2023 2007)  Fluid and electrolyte balance are achieved/maintained:   Observe for cardiac arrhythmias   Monitor for muscle weakness   Monitor/assess lab values and report abnormal values   Assess and reassess fluid and electrolyte status  Goal: Adequate hydration  Outcome: Progressing  Flowsheets (Taken 02/11/2023 2007)  Adequate hydration:   Assess mucus membranes, skin color, turgor, perfusion and presence of edema   Assess for peripheral, sacral, periorbital and abdominal edema   Monitor and assess vital signs and perfusion     Problem: Diabetes: Glucose Imbalance  Goal: Blood glucose stable at established goal  Outcome: Progressing  Flowsheets (Taken 02/11/2023 2007)  Blood glucose stable at established goal:   Monitor lab values   Monitor intake and output.  Notify LIP if urine output is < 30 mL/hour.   Follow fluid restrictions/IV/PO parameters   Assess for hypoglycemia /hyperglycemia   Ensure appropriate consults are obtained (Nutrition, Diabetes Education, and Case Management)   Ensure adequate hydration   Coordinate medication administration with meals, as indicated   Ensure appropriate diet and assess tolerance   Monitor/assess vital signs   Include patient/family in decisions related to nutrition/dietary selections   Ensure patient/family has adequate teaching materials     Problem: Inadequate Gas Exchange  Goal: Adequate oxygenation and improved ventilation  Outcome: Progressing  Flowsheets (Taken 02/11/2023 2007)  Adequate oxygenation and improved ventilation:   Assess lung sounds   Monitor SpO2 and treat as needed   Provide mechanical and oxygen support to facilitate gas exchange   Teach/reinforce use of incentive spirometer 10 times per hour while awake, cough and deep breath as needed   Plan activities to conserve energy: plan rest periods   Increase activity as tolerated/progressive  mobility   Position for maximum ventilatory efficiency   Monitor and treat ETCO2   Consult/collaborate with Respiratory Therapy  Goal: Patent Airway maintained  Outcome: Progressing  Flowsheets (Taken 02/11/2023 2007)  Patent airway maintained:   Position patient for maximum ventilatory efficiency   Suction secretions as needed   Reinforce use of ordered respiratory interventions (i.e. CPAP, BiPAP, Incentive Spirometer, Acapella, etc.)   Reposition patient every 2 hours and as needed unless able to self-reposition   Provide adequate fluid intake to liquefy secretions

## 2023-02-11 NOTE — H&P (Addendum)
[facility]      Patient: Cynthia Kim  Date: 02/11/2023   DOB: 1931-08-10  Date of Admission: 02/11/2023   MRN: [id number]  Attending: Jeronimo Greaves, MD         CC: Generalized weakness and mental status today    History Gathered From: Patient, ED physician, old medical record reviewed    HISTORY AND PHYSICAL     Cynthia Kim is a 89 y.o. female with a PMHx of atrial fibrillation only on aspirin who presented with generalized weakness and altered mental status.  Patient husband was diagnosed with COVID last Sunday in the ED and was sent home medications.  Last Tuesday, patient developed fever, chills, cough.  Her symptoms worsened and she had nonbloody diarrhea, generalized weakness and altered mental status.  No nausea.  No vomiting.  No abdominal pain.  Her daughter brought her to the ER this morning because she looked worse and dehydrated.  In ED, patient was febrile temp 100.9, tachycardic HR 118, hypertensive BP 160/105 and hypoxemic sats 91% on room air.   She was placed on 2 L of oxygen via nasal cannula with sats 96%. WBC 5.  Sodium 131.  Chloride 98.  Lactic acid 1.4.  LFTs WNL.  COVID-positive.  Influenza negative.  Chest x-ray with interstitial edema, bibasilar atelectasis.     Medical History[1]    Past Surgical History:   Procedure Laterality Date    HYSTERECTOMY         Prior to Admission medications    Medication Sig Start Date End Date Taking? Authorizing Provider   aspirin EC 325 MG EC tablet Take 1 tablet (325 mg) by mouth daily    [provider]   Cholecalciferol (VITAMIN D) 1000 UNIT tablet Take 1 tablet (1,000 Units) by mouth daily    [provider]   digoxin (LANOXIN) 0.125 MG tablet TAKE 1 TABLET DAILY 09/27/22   Lysle Rubens, MD   fluocinonide (LIDEX) 0.05 % external solution APPLY TO ITCHY AREAS OF SCALP ONE TO TWICE DAILY AS NEEDED FOR UP TO 2 WEEKS AT A TIME, USE AS NEEDED 12/16/20   [provider]   meclizine (ANTIVERT) 25 MG tablet  Take 1 tablet (25 mg) by mouth 3 (three) times daily as needed for Dizziness 12/20/22   Alycia Rossetti C, DO   metoprolol (TOPROL-XL) 100 MG 24 hr tablet Take 1 tablet (100 mg total) by mouth daily. 01/17/16   Sharlette Dense, MD   sertraline (ZOLOFT) 25 MG tablet Take 1 tablet (25 mg) by mouth daily    [provider]       Allergies[2]    No family history on file.    Social History[3]    REVIEW OF SYSTEMS   Positive for: Fever, chills, generalized weakness  Negative for: Nausea, vomiting  All ROS completed and otherwise negative.    PHYSICAL EXAM     Vital Signs (most recent):   Vitals:    02/11/23 1753   BP: 129/66   Pulse: (!) 120   Resp:    Temp:    SpO2:        Constitutional: No apparent distress. Patient speaks freely in full sentences.   HEENT: NC/AT, PERRL, no scleral icterus or conjunctival pallor, no nasal discharge, MMM, oropharynx without erythema or exudate  Neck: trachea midline, supple, no cervical or supraclavicular lymphadenopathy or masses  Cardiovascular: Tachycardic.  S1-S2 present.  Respiratory: Normal rate. No retractions or increased work of  breathing.  Decreased air entry at the bases.  No crackles.  No wheezes.  Gastrointestinal: +BS, non-distended, soft, non-tender, no rebound or guarding.  Genitourinary: no suprapubic or costovertebral angle tenderness  Musculoskeletal: ROM and motor strength grossly normal. No clubbing, edema, or cyanosis. DP and radial pulses 2+ and symmetric.  Neurologic: EOMI, CN 2-12 grossly intact. no gross motor or sensory deficits  Psychiatric: Alert oriented to person and place.  Confused.     LABS & IMAGING     Recent Results (from the past 24 hour(s))   Lactic Acid    Collection Time: 02/11/23  7:29 AM   Result Value Ref Range    Whole Blood Lactic Acid 1.4 0.2 - 2.0 mmol/L   Comprehensive Metabolic Panel    Collection Time: 02/11/23  7:29 AM   Result Value Ref Range    Glucose 120 (H) 70 - 100 mg/dL    BUN 10 7 - 21 mg/dL    Creatinine 0.7 0.4 - 1.0  mg/dL    Sodium 161 (L) 096 - 145 mEq/L    Potassium 3.8 3.5 - 5.3 mEq/L    Chloride 98 (L) 99 - 111 mEq/L    CO2 22 17 - 29 mEq/L    Calcium 9.0 7.9 - 10.2 mg/dL    Anion Gap 04.5 5.0 - 15.0    GFR >60.0 >=60.0 mL/min/1.73 m2    AST (SGOT) 34 5 - 41 U/L    ALT 19 0 - 55 U/L    Alkaline Phosphatase 74 37 - 117 U/L    Albumin 3.8 3.5 - 5.0 g/dL    Protein, Total 7.4 6.0 - 8.3 g/dL    Globulin 3.6 2.0 - 3.6 g/dL    Albumin/Globulin Ratio 1.1 0.9 - 2.2    Bilirubin, Total 0.6 0.2 - 1.2 mg/dL   CBC with Differential (Component)    Collection Time: 02/11/23  7:29 AM   Result Value Ref Range    WBC 5.53 3.10 - 9.50 x10 3/uL    Hemoglobin 11.5 11.4 - 14.8 g/dL    Hematocrit 40.9 (L) 34.7 - 43.7 %    Platelet Count 235 142 - 346 x10 3/uL    MPV 9.1 8.9 - 12.5 fL    RBC 3.99 3.90 - 5.10 x10 6/uL    MCV 85.7 78.0 - 96.0 fL    MCH 28.8 25.1 - 33.5 pg    MCHC 33.6 31.5 - 35.8 g/dL    RDW 14 11 - 15 %    nRBC % 0.0 <=0.0 /100 WBC    Absolute nRBC 0.00 <=0.00 x10 3/uL    Preliminary Absolute Neutrophil Count 3.95 1.10 - 6.33 x10 3/uL    Neutrophils % 71.4 Not Established %    Lymphocytes % 14.1 Not Established %    Monocytes % 13.7 Not Established %    Eosinophils % 0.2 Not Established %    Basophils % 0.2 Not Established %    Immature Granulocytes % 0.4 Not Established %    Absolute Neutrophils 3.95 1.10 - 6.33 x10 3/uL    Absolute Lymphocytes 0.78 0.42 - 3.22 x10 3/uL    Absolute Monocytes 0.76 0.21 - 0.85 x10 3/uL    Absolute Eosinophils 0.01 0.00 - 0.44 x10 3/uL    Absolute Basophils 0.01 0.00 - 0.08 x10 3/uL    Absolute Immature Granulocytes 0.02 0.00 - 0.07 x10 3/uL   COVID-19 and Influenza ([place]) (symptomatic)    Collection Time: 02/11/23  7:31 AM  Specimen: Anterior Nares; Swab   Result Value Ref Range    SARS-CoV-2 (COVID-19) RNA Detected (A) Negative, Not Detected    Influenza A RNA Not Detected Not Detected    Influenza B RNA Not Detected Not Detected       MICROBIOLOGY:  Blood Culture: No  Urine Culture:  No    CARDIAC:  EKG Interpretation (upon my review): No      ASSESSMENT & PLAN     Cynthia Kim is a 89 y.o. female admitted with generalized weakness and altered mental status.    Acute hypoxemic respiratory failure secondary to COVID-19 pneumonia and pulmonary edema  Chest x-ray with cardiomegaly and pulmonary edema  Influenza negative.  WBC 5.  Lactic acid 1.4.  Continue 2 liters oxygen via nasal cannula, wean of oxygen to keep sats above 92%  Start remdesivir, dexamethasone, Mucinex.  Hold on IV fluids in view of pulmonary edema in chest x-ray.  Check BNP.  CT chest ordered    Atrial fibrillation with RVR  Resume home metoprolol 100 mg twice daily, aspirin and digoxin  Patient not on anticoagulation    Debility  Ambulatory dysfunction  Consult PT/OT    Resume home Zoloft.             Recent Labs     02/11/23  0729   Sodium 131*     Diagnosis: Mild Hyponatremia      Nutrition: Regular diet    DVT/VTE Prophylaxis:   Current Facility-Administered Medications (Includes Only Anticoagulants, Misc. Hematological)   Medication Dose Route Last Admin   None       Code Status: Full code    Patient Class: INPATIENT. Inpatient status is judged to be reasonable and necessary in order to provide the required intensity of service to ensure the patient's safety. The patient's presenting symptoms, physical exam findings, and initial radiographic and laboratory data in the context of their chronic comorbidities is felt to place them at high risk for further clinical deterioration. Furthermore, it is not anticipated that the patient will be medically stable for discharge from the hospital within 2 midnights of admission. The following factors support the admission status of inpatient: the patient's presenting symptoms of generalized weakness, altered mental status, worrisome physical exam findings of tachypnea, tachycardia, fever, hypoxemia, worrisome laboratory data of abnormal chest x-ray, and significant comorbidities  including A-fib, advanced age.    I certify that at the point of admission it is my clinical judgment that the patient will require inpatient hospital care spanning beyond 2 midnights from the point of admission due to high intensity of service, high risk for further deterioration and high frequency of surveillance required.     Anticipated medical stability for discharge: Greater than 48 Hours      Signed,  Jeronimo Greaves, MD    02/11/2023 4:22 PM  Time Elapsed: 50 minutes             [1]   Past Medical History:  Diagnosis Date    A-fib     Arthritis     Atrial fibrillation    [2]   Allergies  Allergen Reactions    Amoxicillin Diarrhea    Norco [Hydrocodone-Acetaminophen] Other (See Comments)     Pt reports dizziness within 2 hours of taking; taken with small meal.     Other Other (See Comments)     Mosquito bite causes her to have blisters.   [3]   Social History  Tobacco Use  Smoking status: Never    Smokeless tobacco: Never   Vaping Use    Vaping status: Never Used   Substance Use Topics    Alcohol use: No    Drug use: Never

## 2023-02-11 NOTE — ED Notes (Signed)
Bed: S 13  Expected date:   Expected time:   Means of arrival: [id number] - [place]  Comments:

## 2023-02-11 NOTE — ACP (Advance Care Planning) (Signed)
[facility]  ADVANCED CARE PLANNING PROGRESS NOTE      Patient: Cynthia Kim    Date: 02/11/2023   LOS: 0  Admission Date: 02/11/2023   MRN: [id number]  Attending: Jeronimo Greaves, MD         PURPOSE OF ENCOUNTER   Purpose: Goals of care    Parties in Attendance: Patient, daughter Jabier Mutton, husband Lahna Nath.    Decisional Capacity: Patient is confused, I spoke over the phone with husband Anniya Whiters.    SUBJECTIVE     I reviewed patient's medical conditions and desire to ongoing aggressive care including cardiac resuscitation, intubation, mechanical ventilation.  Also we discussed who should speak on his behalf should he be unable to do so.  Discussed what conversations should he have with his family regarding his wishes for future care.      OBJECTIVE   Active Diagnoses:    Atrial fibrillation, acute hypoxemic respiratory failure,    These active diagnoses are of sufficient risk that focused discussion on advance care planning is indicated in order to allow the patient to thoughtfully consider personal goals of care, and if situations arise that prevent the ability to personally give input, to ensure appropriate representation of their personal desires for different levels and aggressiveness of care.     GOALS OF CARE DISCUSSION     Topics Discussed:  Patient's medical condition and diagnosis: [ x ] yes [  ] no   Surrogate decision maker: [ x ] yes [  ] no   Patient's current physical function/cognitive function/frailty: [x  ] yes [  ] no   Code Status: [  x] yes [  ] no   Artificial Nutrition / Dialysis / Non-Invasive Ventilation / Blood Transfusion: [x  ] yes [  ] no  Potential Resources for home (durable medical equipment, home nursing, home O2): [  x] yes [  ] no    Overview of Discussion: I spoke over the phone with husband Anjelita Sheahan, patient wishes is to be full code.  She wants all aggressive treatment including CPR, ACLS, intubation/mechanical ventilation, vasopressors, dialysis,  artificial nutrition and blood transfusion. Per patient prior statements, she never wants to be disconnected from life support.      Code Status: Full code  Advanced Care Planning Documents: No    Total time spent face-to-face in education and discussion: 16 minutes.     Jeronimo Greaves, MD  Date of Service:  02/11/2023  6:32 PM

## 2023-02-11 NOTE — ED Provider Notes (Signed)
[facility] EMERGENCY DEPARTMENT  ATTENDING PHYSICIAN HISTORY AND PHYSICAL EXAM     Patient Name: Cynthia Kim, Cynthia Kim  Department:[facility] EMERGENCY DEPT  Encounter Date:  02/11/2023  Attending Physician: Randol Kern, MD   Age: 89 y.o. female  Patient Room: S 13/S 13  PCP: Hayden Pedro, MD           Diagnosis/Disposition:     Final diagnoses:   COVID   Generalized weakness   Hypoxia   Hyponatremia       ED Disposition       ED Disposition   Admit to [facility]    Condition   --    Date/Time   Fri Feb 11, 2023 10:11 AM    Comment   --               Follow-Up Providers (if applicable)    No follow-up provider specified.     Discharge Medication List as of 02/11/2023  2:58 PM              Medical Decision Making:     Initial Differential Diagnosis:  Initial differential diagnosis to include but not limited to: COVID, pneumonia, electrolyte abnormality.    Plan:  Sepsis work-up    Final Impression:  89 year old female, history of A-fib, here with generalized weakness and overnight delirium as reported by the daughter at bedside, in the setting of recent COVID exposure, starting Paxlovid on Tuesday.  No reported trauma.  Exam shows mild hypoxia, patient with generalized weakness but no focal neurologic deficits.  Lactic not elevated.  Labs notable for mild hyponatremia and hypochloremia.  Chest x-ray with viral pneumonia.  Will admit to the hospital service for further IV hydration given the patient's severe weakness  The patient was admitted and handed off to Dr. Luther Redo. We discussed all aspects of the work-up that had been completed in the ED, any pending studies/therapies, and all clinical decision making at the time care was handed off.          Medical Decision Making  Amount and/or Complexity of Data Reviewed  Labs: ordered.  Radiology: ordered.    Risk  OTC drugs.  Decision regarding hospitalization.      Records Reviewed (internal and external)? : physician office records including [facility] progress notes.  Was management discussed with a  consultant? : N/A  Diagnostic test considered and not performed : N/A  Prescription medications considered and not given : N/A  Hospitalization considered but not done : N/A  Was the decision around the need for surgery discussed with a consultant? : N/A  Social Determinants of Health Considerations : N/A  Was there decision to not resuscitate or to de-escalate care due to poor prognosis? : N/A        History of Presenting Illness:     Nursing Triage note: Pt BIBA for +covid symptoms, low grade fever, confusion, and low SpO2 of 90% on RA. EMS states pt daughter was concerned about slight confusion last night, currently A/O x4. Pt temp 100.3 F oral. Currently on RA 93% SpO2, med hx of Afib  Chief complaint: Fever and Nausea    Cynthia Kim is a 89 y.o. female with PMHx of A-fib who presents with positive COVID test. Daughter brought her mom in due to concern for altered mental status overnight. Patient's husband had COVID diagnosed 4 days ago.     The next day she began to have symptoms of chills, congestion, otalgia, body aches, unusual taste, unusual  dreams, and cough. The next day, she went to her PCP and was prescribed Paxlovid which she started that afternoon.     Daughter reports that last night she was wandering around and not making sense prompting ED visit.    Associated symptoms include fever (Tmax 100.4) and nausea.  Denies SOB.    Denies being on thinners. Cardiology reports that is a personal choice.  She is compliant with her A-fib medication.  Denies being on O2 at home.        Review of Systems:  Physical Exam:     Review of Systems    Positive and negative ROS per above and in HPI. All other systems reviewed and negative.     Triage Vitals: Pulse (!) 101  BP 181/90  Resp 18  SpO2 93 %  Temp 100.3 F (37.9 C)       Physical Exam  Vitals and nursing note reviewed.   Constitutional:       General: She is not in acute distress.     Appearance: Normal appearance. She is ill-appearing.   HENT:       Head: Normocephalic and atraumatic.      Nose: Congestion present.      Mouth/Throat:      Mouth: Mucous membranes are moist.   Eyes:      Pupils: Pupils are equal, round, and reactive to light.   Cardiovascular:      Rate and Rhythm: Tachycardia present. Rhythm irregular.      Pulses: Normal pulses.      Heart sounds: No murmur heard.  Pulmonary:      Effort: Pulmonary effort is normal. No respiratory distress.   Abdominal:      General: Abdomen is flat. Bowel sounds are normal.      Tenderness: There is no abdominal tenderness.   Musculoskeletal:         General: Normal range of motion.      Cervical back: Normal range of motion.   Skin:     General: Skin is warm and dry.      Capillary Refill: Capillary refill takes less than 2 seconds.   Neurological:      General: No focal deficit present.      Mental Status: She is alert.      Sensory: No sensory deficit.      Motor: No weakness.   Psychiatric:         Behavior: Behavior normal.           Interpretations, Clinical Decision Tools and Critical Care:     O2 Sat:  The patient's oxygen saturation was 93 % on room air. This was independently interpreted by me as borderline normal.     Radiology:  I reviewed and independently interpreted the following radiology studies: - X-Ray of chest interpreted as haziness consistent with viral pneumonia.           Procedures:   Procedures      Attestations:     Scribe Attestation: I was acting as a Neurosurgeon for Randol Kern, MD on [facility] SUE   Lorenda Ishihara     I am the first provider for this patient and I personally performed the services documented. Lorenda Ishihara is scribing for me on [facility] SUE .This note and the patient instructions accurately reflect work and decisions made by me.  Randol Kern, MD    Documentation Notes:  Parts of this note were generated by the Epic EMR system/  Dragon speech recognition and may contain inherent errors or omissions not intended by the user. Grammatical errors, random word insertions,  deletions, pronoun errors and incomplete sentences are occasional consequences of this technology due to software limitations. Not all errors are caught or corrected.  My documentation is often completed after the patient is no longer under my clinical care. In some cases, the Epic EMR may pull updated results into the above documentation which may not reflect all results or information that were available to me at the time of my medical decision making.   If there are questions or concerns about the content of this note or information contained within the body of this dictation they should be addressed directly with the author for clarification.                  Randol Kern, MD  02/11/23 2118

## 2023-02-11 NOTE — ED Triage Notes (Addendum)
Pt BIBA, c/o worsening covid symptoms.  Tested positive for covid 3 days ago, c/o of fever of 100F, and nausea  Pt states generally feeling unwell.  States taking tylenol, but pt unsure when    Per EMS pt daughter stated that pt had bout of slight confusion last night  Currently A/O x4 breathing unlabored and even  on RA SpO2 at 93%    Pt denies SOB, CP, N/T, dizziness/lightheadedness, HA    Pt states med hx of afib, currently on aspirin

## 2023-02-11 NOTE — ED Notes (Signed)
Attempted report for the second time. Receiving RN notified this nurse that she is on her lunch break and to call her in 30 min

## 2023-02-11 NOTE — Progress Notes (Cosign Needed)
4 eyes in 4 hours pressure injury assessment note:      Completed with:   Unit & Time admitted:              Bony Prominences: Check appropriate box; if wound is present enter wound assessment in LDA     Occiput:                 [x]WNL  [] Wound present  Face:                     [x]WNL  [] Wound present  Ears:                      [x]WNL  [] Wound present  Spine:                    [x]WNL  [] Wound present  Shoulders:             [x]WNL  [] Wound present  Elbows:                  [x]WNL  [] Wound present  Sacrum/coccyx:     [x]WNL  [] Wound present  Ischial Tuberosity:  [x]WNL  [] Wound present  Trochanter/Hip:      [x]WNL  [] Wound present  Knees:                   [x]WNL  [] Wound present  Ankles:                   [x]WNL  [] Wound present  Heels:                    [x]WNL  [] Wound present  Other pressure areas:  [] Wound location       Device related: [] Device name:         LDA completed if wound present: yes/no  Consult WOCN if necessary    Other skin related issues, ie tears, rash, etc, document in Integumentary flowsheet

## 2023-02-11 NOTE — ED Notes (Signed)
Pt attempting to get out of bed. Pt is placed on fall bundle monitors

## 2023-02-11 NOTE — ED to IP RN Note (Signed)
[facility] HOSPITAL EMERGENCY DEPT  ED NURSING NOTE FOR THE RECEIVING INPATIENT NURSE   ED NURSE     [place] [id number]   ED CHARGE RN Fransisco Beau    ADMISSION INFORMATION   Cynthia Kim is a 89 y.o. female admitted with an ED diagnosis of:    No diagnosis found.     Isolation: Contact, airborne    Allergies: Amoxicillin, Norco [hydrocodone-acetaminophen], and Other   Holding Orders confirmed? N/A   Belongings Documented? N/A   Home medications sent to pharmacy confirmed? N/A   NURSING CARE   Patient Comes From:   Mental Status: Home/Family Care  alert and oriented   ADL: Needs assistance with ADLs   Ambulation: 2 person assist   Pertinent Information  and Safety Concerns:     Broset Violence Risk Level: Low 89 y.o. female with PMHx of A-fib who presents with positive COVID test. Daughter brought her mom in due to concern for altered mental status overnight.    CT / NIH   CT Head ordered on this patient?  No   NIH/Dysphagia assessment done prior to admission? N/A   VITAL SIGNS (at the time of this note)      Vitals:    02/11/23 0926   BP: 174/78   Pulse: (!) 108   Resp: 19   Temp:    SpO2: 94%     Pain Score: 0-No pain (02/11/23 0706)

## 2023-02-12 LAB — BASIC METABOLIC PANEL
Anion Gap: 12 (ref 5.0–15.0)
BUN: 12 mg/dL (ref 7–21)
CO2: 24 mEq/L (ref 17–29)
Calcium: 9.1 mg/dL (ref 7.9–10.2)
Chloride: 98 mEq/L — ABNORMAL LOW (ref 99–111)
Creatinine: 0.7 mg/dL (ref 0.4–1.0)
GFR: >60 mL/min/{1.73_m2} (ref >=60.0)
Glucose: 177 mg/dL — ABNORMAL HIGH (ref 70–100)
Potassium: 3.3 mEq/L — ABNORMAL LOW (ref 3.5–5.3)
Sodium: 134 mEq/L — ABNORMAL LOW (ref 135–145)

## 2023-02-12 LAB — LAB USE ONLY - CBC WITH DIFFERENTIAL
Absolute Basophils: 0 10*3/uL (ref 0.00–0.08)
Absolute Eosinophils: 0 10*3/uL (ref 0.00–0.44)
Absolute Immature Granulocytes: 0.01 10*3/uL (ref 0.00–0.07)
Absolute Lymphocytes: 0.61 10*3/uL (ref 0.42–3.22)
Absolute Monocytes: 0.2 10*3/uL — ABNORMAL LOW (ref 0.21–0.85)
Absolute Neutrophils: 1.6 10*3/uL (ref 1.10–6.33)
Absolute nRBC: 0 10*3/uL (ref <=0.00)
Basophils %: 0 %
Eosinophils %: 0 %
Hematocrit: 36.7 % (ref 34.7–43.7)
Hemoglobin: 12.4 g/dL (ref 11.4–14.8)
Immature Granulocytes %: 0.4 %
Lymphocytes %: 25.2 %
MCH: 28.6 pg (ref 25.1–33.5)
MCHC: 33.8 g/dL (ref 31.5–35.8)
MCV: 84.6 fL (ref 78.0–96.0)
MPV: 8.8 fL — ABNORMAL LOW (ref 8.9–12.5)
Monocytes %: 8.3 %
Neutrophils %: 66.1 %
Platelet Count: 263 10*3/uL (ref 142–346)
Preliminary Absolute Neutrophil Count: 1.6 10*3/uL (ref 1.10–6.33)
RBC: 4.34 10*6/uL (ref 3.90–5.10)
RDW: 14 % (ref 11–15)
WBC: 2.42 10*3/uL — ABNORMAL LOW (ref 3.10–9.50)
nRBC %: 0 /100 WBC (ref <=0.0)

## 2023-02-12 LAB — NT-PROBNP: NT-ProBNP: 5329 pg/mL — ABNORMAL HIGH (ref <450)

## 2023-02-12 LAB — MAGNESIUM
Magnesium: 1.6 mg/dL (ref 1.6–2.6)
Magnesium: 1.8 mg/dL (ref 1.6–2.6)

## 2023-02-12 MED ORDER — ZIPRASIDONE MESYLATE 20 MG IM SOLR
10.0000 mg | Freq: Once | INTRAMUSCULAR | Status: AC
Start: 2023-02-12 — End: 2023-02-12
  Administered 2023-02-12: 10 mg via INTRAMUSCULAR
  Filled 2023-02-12: qty 10

## 2023-02-12 MED ORDER — MELATONIN 3 MG PO TABS
6.0000 mg | ORAL_TABLET | Freq: Every evening | ORAL | Status: DC
Start: 2023-02-12 — End: 2023-02-13
  Administered 2023-02-12: 6 mg via ORAL
  Filled 2023-02-12: qty 2

## 2023-02-12 MED ORDER — POTASSIUM CHLORIDE 20 MEQ PO PACK
40.0000 meq | PACK | Freq: Once | ORAL | Status: AC
Start: 2023-02-12 — End: 2023-02-12
  Administered 2023-02-12: 40 meq via ORAL
  Filled 2023-02-12: qty 2

## 2023-02-12 NOTE — Plan of Care (Addendum)
Pt was awoken from sleep for am vitals and lab work. Pt became visibly frustrated, combative, irritable, restless, attempting to get out of bed, disoriented, and non redirectable despite daughter being at bedside. Pt was refusing all care at that moment. RN unable to de-escalate the situation with therapeutic communication and redirection.  MD Patience Wayland Salinas made aware of findings. Geodon ordered and administered with effective relief. Pt currently alert and oriented and resting comfortably without any complaints. And now accepting care. Am K 3.3, replenished per protocol.     Problem: Inadequate Gas Exchange  Goal: Adequate oxygenation and improved ventilation  Outcome: Progressing  Flowsheets (Taken 02/12/2023 0127)  Adequate oxygenation and improved ventilation:   Assess lung sounds   Monitor SpO2 and treat as needed   Provide mechanical and oxygen support to facilitate gas exchange   Position for maximum ventilatory efficiency   Teach/reinforce use of incentive spirometer 10 times per hour while awake, cough and deep breath as needed   Plan activities to conserve energy: plan rest periods   Increase activity as tolerated/progressive mobility     Problem: Inadequate Cardiac Output  Goal: Adequate tissue perfusion will be maintained  Outcome: Progressing  Flowsheets (Taken 02/12/2023 0128)  Adequate tissue perfusion will be maintained:   Monitor/assess lab values and report abnormal values   Encourage/assist patient as needed to turn, cough, and perform deep breathing every 2 hours     Problem: Infection  Goal: Free from infection  Outcome: Progressing  Flowsheets (Taken 02/12/2023 0128)  Free from infection:   Utilize isolation precautions per protocol/policy   Assess for signs/symptoms of infection

## 2023-02-12 NOTE — Progress Notes (Signed)
[facility]  PROGRESS NOTE      Patient: Cynthia Kim  Date: 02/12/2023   LOS: 1 Days  Admission Date: 02/11/2023   MRN: [id number]  Attending: Elisha Headland, MD  When on service as the attending, please contact me on Epic Secure Chat from 7 AM - 7 PM for non-urgent issues. For urgent matters use XTend page from 7 AM - 7 PM.       ASSESSMENT/PLAN     Cynthia Kim is a 89 y.o. female with history of atrial fibrillation not on anticoagulation admitted with COVID-19    Acute hypoxic respiratory failure, resolved  COVID-19 infection  Patient presenting for evaluation of increased confusion.  Reports mainly upper respiratory symptoms, denies dyspnea on exertion prior to admission. Chest x-ray with cardiomegaly and pulmonary edema.  NT proBNP 5329, however patient does not appear volume overloaded on examination.  - Wean off oxygen as able, on room air today.  - Continue remdesivir, dexamethasone while inpatient  - Continue Mucinex  - Follow-up CT chest for further evaluation of hypoxemia    Acute metabolic encephalopathy  Most likely secondary to above COVID-19 infection.  At risk of sundowning.  - Start melatonin 6 mg nightly  - Delirium precautions     Atrial fibrillation with RVR, improved  Resume home metoprolol 100 mg twice daily, aspirin and digoxin  Patient not on anticoagulation  - Continue to monitor for now     Debility  Ambulatory dysfunction  - PT/OT recommending home with supervision/home with home PT/OT    Nutrition: Regular diet                  Recent Labs     02/12/23  0539 02/11/23  0729   Sodium 134* 131*     Diagnosis: Mild Hyponatremia      Recent Labs     02/12/23  0539 02/11/23  0729   Potassium 3.3* 3.8     Diagnosis: Hypokalemia       Code Status: Full Code    Dispo: Hopeful discharge tomorrow    Family Contact: Daughter updated bedside 8/3    DVT Prophylaxis:   Current Facility-Administered Medications (Includes Only Anticoagulants, Misc. Hematological)   Medication Dose Route Last  Admin    enoxaparin (LOVENOX) syringe 40 mg  40 mg Subcutaneous 40 mg at 02/12/23 1478          CHART  REVIEW & DISCUSSION     The following chart items were reviewed as of 2:53 PM on 02/12/23:  [x]  Lab Results [x]  Imaging Results   [x]  Problem List  [x]  Current Orders [x]  Current Medications  []  Allergies  []  Code Status [x]  Previous Notes   []  SDoH    The management and plan of care for this patient was discussed with the following specialty consultants:  []  Cardiology  []  Gastroenterology                 []  Infectious Disease  []  Pulmonology []  Neurology                []  Nephrology  []  Neurosurgery []  Orthopedic Surgery  []  Heme/Onc  []  General Surgery []  Psychiatry                                   []  Palliative    SUBJECTIVE     Cynthia Kim denies any new  complaints this morning.  She does recall feeling agitated overnight.  She reports overall feeling better, symptoms of taste and smell loss are improving.  Denies shortness of breath prior to admission.    MEDICATIONS     Current Facility-Administered Medications   Medication Dose Route Frequency    aspirin EC  325 mg Oral Daily    dexAMETHasone  6 mg Oral Daily    digoxin  125 mcg Oral Daily    enoxaparin  40 mg Subcutaneous Daily    guaiFENesin  600 mg Oral Q12H [facility]    lactobacillus/streptococcus  1 capsule Oral Daily    metoprolol succinate XL  200 mg Oral Daily    remdesivir (VEKLURY) maintenance dose 100 mg in 250 mL infusion  100 mg Intravenous daily at 2100    sertraline  25 mg Oral Daily       PHYSICAL EXAM     Vitals:    02/12/23 1149   BP: 146/79   Pulse: 89   Resp: 18   Temp: 97.3 F (36.3 C)   SpO2: 95%       Temperature: Temp  Min: 97 F (36.1 C)  Max: 98.1 F (36.7 C)  Pulse: Pulse  Min: 65  Max: 134  Respiratory: Resp  Min: 16  Max: 20  Non-Invasive BP: BP  Min: 122/71  Max: 150/94  Pulse Oximetry SpO2  Min: 92 %  Max: 97 %    Intake and Output Summary (Last 24 hours) at Date Time    Intake/Output Summary (Last 24 hours) at 02/12/2023  1453  Last data filed at 02/12/2023 0925  Gross per 24 hour   Intake 930 ml   Output 1450 ml   Net -520 ml     GEN APPEARANCE: Normal;  A&OX3 to self, location, time   CVS: RRR, S1, S2; No M/G/R  LUNGS: CTABL  ABD: Soft; No TTP; + Normoactive BS  EXT: No edema    LABS     Recent Labs   Lab 02/12/23  0539 02/11/23  0729   WBC 2.42* 5.53   RBC 4.34 3.99   Hemoglobin 12.4 11.5   Hematocrit 36.7 34.2*   MCV 84.6 85.7   Platelet Count 263 235       Recent Labs   Lab 02/12/23  0539 02/11/23  1810 02/11/23  0729   Sodium 134*  --  131*   Potassium 3.3*  --  3.8   Chloride 98*  --  98*   CO2 24  --  22   BUN 12  --  10   Creatinine 0.7  --  0.7   Glucose 177*  --  120*   Calcium 9.1  --  9.0   Magnesium  --  1.6  --        Recent Labs   Lab 02/11/23  0729   ALT 19   AST (SGOT) 34   Bilirubin, Total 0.6   Albumin 3.8   Alkaline Phosphatase 74                   Microbiology Results (last 15 days)       Procedure Component Value Units Date/Time    COVID-19 and Influenza (Liat) (symptomatic) [[id number]]  (Abnormal) Collected: 02/11/23 0731    Order Status: Completed Specimen: Swab from Anterior Nares Updated: 02/11/23 0811     SARS-CoV-2 (COVID-19) RNA Detected     Influenza A RNA Not Detected     Influenza B  RNA Not Detected    Narrative:      A result of "Detected" indicates POSITIVE for the presence of viral RNA  A result of "Not Detected" indicates NEGATIVE for the presence of viral RNA    Test performed using the Roche cobas Liat SARS-CoV-2 & Influenza A/B assay. This is a multiplex real-time RT-PCR assay for the detection of SARS-CoV-2, influenza A, and influenza B virus RNA. Viral nucleic acids may persist in vivo, independent of viability. Detection of viral nucleic acid does not imply the presence of infectious virus, or that virus nucleic acid is the cause of clinical symptoms. Negative results do not preclude SARS-CoV-2, influenza A, and/or influenza B infection and should not be used as the sole basis for diagnosis,  treatment or other patient management decisions. Invalid results may be due to inhibiting substances in the specimen and recollection should occur.              RADIOLOGY     XR Chest 2 Views    Result Date: 02/11/2023  1.Cardiomegaly and interstitial edema. 2.Bibasilar atelectasis. Jasmine December D'Heureux, MD 02/11/2023 8:05 AM   Echo Results       None          Results for orders placed or performed during the hospital encounter of 12/20/22   CT Head without Contrast    Narrative    HISTORY: dizziness.     COMPARISON: 02/25/2020    TECHNIQUE: CT of the head performed without intravenous contrast. The  following dose reduction techniques were utilized: automated exposure  control and/or adjustment of the mA and/or KV according to patient size,  and the use of an iterative reconstruction technique.    CONTRAST: None.    FINDINGS:  There are periventricular hypodensities that likely reflect chronic small  vessel ischemic changes. There is no acute intracranial hemorrhage or  territorial infarct. There is no mass effect or midline shift. The  calvarium is intact.      Impression       1.No acute intracranial hemorrhage or mass effect.  2.Chronic appearing small vessel ischemic changes.    Nicoletta Dress, MD  12/20/2022 5:18 PM   Results for orders placed or performed during the hospital encounter of 08/27/22   MRI Brain W WO Contrast    Narrative    HISTORY: Bilateral hearing loss. Benign paroxysmal vertigo of the right  ear.    COMPARISON: Comparison is made to CT of the brain dated 02/25/2020 and MRI  of the brain dated 05/10/2013.    TECHNIQUE: MRI of the brain performed on a 3 Tesla scanner without and with  intravenous contrast. Examination performed per IAC protocol with high  resolution images through the internal auditory canals.     CONTRAST: gadoterate meglumine (CLARISCAN) 7.5 MMOL/15ML injection 15 mL    FINDINGS:   There is diffuse enlargement of the ventricles and sulci, consistent with  age related cerebral volume  loss. Confluent periventricular as well as  scattered deep and subcortical T2 hyperintensity is nonspecific, but likely  reflects the sequela of chronic small vessel ischemic disease. No  intracranial hemorrhage, midline shift or mass-effect is present. No  extra-axial fluid collections are seen. No diffusion restriction or  pathologic enhancement is present.    The cerebellopontine angles and internal auditory canals are unremarkable  in appearance. No pathologic enhancement or mass lesion is demonstrated.  The vestibular cochlear systems are normal in appearance.      The orbital contents are unremarkable. The  visualized paranasal sinuses and  mastoids are clear.      Impression       1. Unremarkable MR evaluation of the internal auditory canals and CP  angles. No MR evidence of acute intracranial abnormality.  2. Generalized cerebral volume loss and sequela of chronic microvascular  change.    Neldon Mc, MD  09/02/2022 8:12 AM       Signed,  Elisha Headland, MD  2:53 PM 02/12/2023

## 2023-02-12 NOTE — PT Eval Note (Addendum)
Physical Therapy Eval and Treatment  Cynthia Kim      Post Acute Care Therapy Recommendations   Discharge Recommendations:  Home with supervision, Home with home health PT    DME needs IF patient is discharging home: Front wheel walker    Therapy discharge recommendations may change with patient status.  Please refer to most recent note for up-to-date recommendations.    Unit: [facility] [place] UNIT 21  Bed: A2114/A2114-01    ___________________________________________________    Time of Evaluation and Treatment:  Time Calculation   PT Received On: 02/12/23  Start Time: 1210  Stop Time: 1240  Time Calculation (min): 30 min       Evaluation Time: 10 minutes  Treatment Time: 20 minutes    Chart Review and Collaboration with Care Team: 5 minutes, not included in above time    PT Visit Number: 1    Consult received for Cynthia Kim for PT Evaluation and Treatment.  Patient's medical condition is appropriate for Physical therapy intervention at this time.    Activity Orders:  PT eval and treat    Precautions and Contraindications:  Precautions  Fall Risks: High, Impaired balance/gait, Muscle weakness  Other Precautions: COVID +, isolation precautions    Personal Protective Equipment (PPE)  gloves, procedure gown, disposable N95, and eye shield/covering    Medical Diagnosis:  COVID [U07.1]  COVID-19 [U07.1]    History of Present Illness:  Cynthia Kim is a 89 y.o. female admitted on 02/11/2023 with a PMHx of atrial fibrillation only on aspirin who presented with generalized weakness and altered mental status.  Patient husband was diagnosed with COVID last Sunday in the ED and was sent home medications.  Last Tuesday, patient developed fever, chills, cough.  Her symptoms worsened and she had nonbloody diarrhea, generalized weakness and altered mental status.  No nausea.  No vomiting.  No abdominal pain.  Her daughter brought her to the ER this morning because she looked worse and dehydrated.     Problem List[1]    Past  Medical/Surgical History:  Medical History[2]  Past Surgical History:   Procedure Laterality Date    HYSTERECTOMY         X-Rays/Tests/Labs:  Lab Results   Component Value Date/Time    HGB 12.4 02/12/2023 05:39 AM    HGB 13.5 06/20/2022 01:50 AM    HCT 36.7 02/12/2023 05:39 AM    HCT 40.2 06/20/2022 01:50 AM    K 3.3 (L) 02/12/2023 05:39 AM    K 4.3 09/03/2022 03:11 PM    NA 134 (L) 02/12/2023 05:39 AM    NA 138 09/03/2022 03:11 PM    INR 1.0 01/16/2016 04:40 PM    TROPI 5.0 12/20/2022 04:33 PM    TROPI <0.01 01/17/2016 04:25 PM    TROPI <0.01 01/17/2016 09:50 AM    TROPI <0.01 01/17/2016 03:35 AM    TROPI 0.01 01/16/2016 07:47 PM       All imaging reviewed, please see chart for details.    Social History:  Prior Level of Function  Prior level of function: Independent with ADLs, Ambulates independently  Baseline Activity Level: Community ambulation  Ambulated 100 feet or more prior to admission: Yes  Driving: independent    Home Living Arrangements  Living Arrangements: Spouse/significant other, Children  Type of Home: House  Home Layout: Multi-level, Bed/bath upstairs, Stairs to enter with rails (add number in comment) (3 STE b/l rail, 1 FOS to bed/bath with unilateral rail, 1 FOS to  basement with unilateral rail)  Bathroom Shower/Tub: Tub/shower unit (pt also has a walkin in shower)  Firefighter: Standard  Home Living - Notes / Comments: pt lives with spouse and daughter who are available to provide support at home      Subjective:  Patient is agreeable to participation in the therapy session.  Patient Goal: To get better  Pain Assessment  Pain Assessment: No/denies pain      Objective:  Observation of Patient/Vital Signs:  Vitals:    02/12/23 1149   BP: 146/79   Pulse: 89   Resp: 18   Temp: 97.3 F (36.3 C)   SpO2: 95%        Inspection/Posture: pt received supine in bed with daughter at bedside    Cognitive Status and Neuro Exam:  Cognition/Neuro Status  Arousal/Alertness: Appropriate responses to  stimuli  Attention Span: Appears intact  Orientation Level: Oriented X4  Memory: Appears intact  Following Commands: Follows all commands and directions without difficulty  Safety Awareness: minimal verbal instruction  Insights: Decreased awareness of deficits  Problem Solving: Able to problem solve independently  Behavior: calm;cooperative  Motor Planning: intact  Coordination: intact    Musculoskeletal Examination  Gross ROM  Right Upper Extremity ROM: within functional limits  Left Upper Extremity ROM: within functional limits  Right Lower Extremity ROM: within functional limits  Left Lower Extremity ROM: within functional limits  Gross Strength  Right Upper Extremity Strength: within functional limits  Left Upper Extremity Strength: within functional limits  Right Lower Extremity Strength: 4/5  Left Lower Extremity Strength: 4/5       Functional Mobility:  Functional Mobility  Supine to Sit: Stand by Assist;Increased Effort;using bedrail  Scooting to EOB: Stand by Assist  Sit to Stand: Contact Guard Assist;Increased Time;Increased Effort;using bedrail;with instruction for hand placement to increase safety (w/ FWW)  Stand to Sit: Contact Guard Assist     Locomotion  Ambulation: Contact Guard Assist;with front-wheeled walker     Balance  Balance  Balance: needs focused assessment  Sitting - Static: Good  Sitting - Dynamic: Good  Standing - Static: Fair (w/ FWW)  Standing - Dynamic: Fair (w/ FWW)    Participation and Activity Tolerance  Participation and Endurance  Participation Effort: good  Endurance: Tolerates 10 - 20 min exercise with multiple rests  [place] Dyspnea Scale: 0 Dyspnea    Educated the Patient to role of physical therapy, plan of care, goals of therapy and safety with mobility and ADLs, discharge instructions, home safety with verbalized understanding  and demonstrated understanding.    Patient left in bedside chair with alarm and all other medical equipment in place and call bell and all  personal items/needs within reach.  RN notified of session outcome.        Assessment:  Cynthia Kim is a 89 y.o. female admitted 02/11/2023.  PT Assessment  Assessment: Decreased LE strength;Decreased safety/judgement during functional mobility;Decreased endurance/activity tolerance;Decreased functional mobility;Decreased balance;Gait impairment  Prognosis: Good;With continued PT status post acute discharge  Progress: Improving as expected      Treatment:  Patient received supine in bed and agreed to participate in PT session. Educated pt on PT role in acute setting and progressive mobility protocol. Increased time/effort required for patient to transition from supine > sit at SBA. Once at EOB, pt required increased time/effort and CGA to perform sit > stand transfer w/ increased difficulty 2/2 weakness. Pt then ambulated within room w/ RW at [facility] demonstrating some  unsteadiness. Pt requested to sit in chair to eat lunch. Pt transferred to bedside chair w/ chair alarm and tray table in front to eat & call bell within reach. Discussed discharge recommendation for home with home PT which pt was agreeable to. Pt will continue to benefit from skilled PT to maximize independence with goals listed below.       Plan:  Treatment/Interventions: Exercise, Gait training, Stair training, Neuromuscular re-education, Functional transfer training, LE strengthening/ROM, Endurance training, Patient/family training, Equipment eval/education, Bed mobility, Continued evaluation  PT Frequency: 1-2x/wk  Risks/Benefits/POC Discussed with Pt/Family: With patient    PMP Activity: Step 6 - Walks in Room  Distance Walked (ft) (Step 6,7): 50 Feet (w/ FWW)      Goals:  Goals  Time for Goal Acheivement: By time of discharge  Pt Will Go Supine To Sit: modified independent, to maximize functional mobility and independence  Pt Will Perform Sit to Stand: modified independent, 1 visit  Pt Will Transfer Bed/Chair: with rolling walker, modified  independent, to maximize functional mobility and independence  Pt Will Ambulate: 101-150 feet, with rolling walker, modified independent, to maximize functional mobility and independence  Pt Will Go Up / Down Stairs: 1 flight, modified independent, With rail, to maximize functional mobility and independence    Rayvon Char PT,DPT  02/12/2023  2:07 PM    Physical Therapist  Physical Medicine and Rehabilitation   [phone]        [facility]  Patient: Cynthia Kim MRN#: [id number]  Unit: [facility] UNIT 21 Bed: A2114/A2114-01          [1]   Patient Active Problem List  Diagnosis    COVID-19   [2]   Past Medical History:  Diagnosis Date    A-fib     Arthritis     Atrial fibrillation

## 2023-02-12 NOTE — UM Notes (Signed)
PATIENT NAME: Cynthia Kim,Cynthia Kim   DOB: August 21, 1931     ADMIT TO INPATIENT (Order #[id number]) on 02/11/23     Admit Diagnosis: COVID [U07.1]  COVID-19 [U07.1]  Facility: [facility] [address]    89 y.o. female with a PMHx of atrial fibrillation only on aspirin who presented with generalized weakness and altered mental status.  Patient husband was diagnosed with COVID last Sunday in the ED and was sent home medications.  Last Tuesday, patient developed fever, chills, cough.  Her symptoms worsened and she had nonbloody diarrhea, generalized weakness and altered mental status.  Her daughter brought her to the ER this morning because she looked worse and dehydrated.  In ED, patient was febrile temp 100.9, tachycardic HR 118, hypertensive BP 160/105 and hypoxemic sats 91% on room air.   She was placed on 2 L of oxygen via nasal cannula with sats 96%. WBC 5.  Sodium 131.  Chloride 98.  Lactic acid 1.4.  LFTs WNL.  COVID-positive.  Influenza negative.  Chest x-ray with interstitial edema, bibasilar atelectasis.     Temp:  [97 F (36.1 C)-100.9 F (38.3 C)]   Heart Rate:  [65-134]   Resp Rate:  [16-20]   BP: (122-154)/(63-95)   SpO2:  [92 %-97 %] placed on 2L nc  Weight:  [55.6 kg (122 lb 9.2 oz)]   BMI (calculated):  [22.4]     Last recorded pain score:  Pain Scale Used: Numeric Scale (0-10)  Pain Score: 0-No pain      Procedure Component Value Units Date/Time    NT-ProBNP [[id number]]  (Abnormal) Collected: 02/12/23 0539    Specimen: Blood, Venous Updated: 02/12/23 0628     NT-ProBNP 5,329 pg/mL     Basic Metabolic Panel [[id number]]  (Abnormal) Collected: 02/12/23 0539     Sodium 134 mEq/L      Potassium 3.3 mEq/L     CBC with Differential (Component) [[id number]]  (Abnormal) Collected: 02/12/23 0539    Specimen: Blood, Venous Updated: 02/12/23 0554     WBC 2.42 x10 3/uL      Scheduled Meds:  Current Facility-Administered Medications   Medication Dose Route Frequency    aspirin EC  325 mg Oral Daily    dexAMETHasone  6 mg Oral  Daily    digoxin  125 mcg Oral Daily    enoxaparin  40 mg Subcutaneous Daily    guaiFENesin  600 mg Oral Q12H [facility]    lactobacillus/streptococcus  1 capsule Oral Daily    metoprolol succinate XL  200 mg Oral Daily    remdesivir (VEKLURY) maintenance dose 100 mg in 250 mL infusion  100 mg Intravenous daily at 2100    sertraline  25 mg Oral Daily     Plan of Care  Acute hypoxemic respiratory failure secondary to COVID-19 pneumonia and pulmonary edema  Chest x-ray with cardiomegaly and pulmonary edema  Influenza negative.  WBC 5.  Lactic acid 1.4.  Continue 2 liters oxygen via nasal cannula, wean of oxygen to keep sats above 92%  Start remdesivir, dexamethasone, Mucinex.  Hold on IV fluids in view of pulmonary edema in chest x-ray.  Check BNP.  CT chest ordered     Atrial fibrillation with RVR  Resume home metoprolol 100 mg twice daily, aspirin and digoxin  Patient not on anticoagulation     Debility  Ambulatory dysfunction  Consult PT/OT       Primary Coverage:  Payor: MEDICARE / Plan: MEDICARE PART A AND B / Product Type: Medicare /  UTILIZATION REVIEW CONTACT: Kennon Rounds RN, BSN  Utilization Review   [facility]  [phone]  [phone]  Email: [email or web address]  Tax ID:  [id number]         NOTES TO REVIEWER:    This clinical review is based on/compiled from documentation provided by the treatment team within the patient's medical record.

## 2023-02-12 NOTE — Plan of Care (Signed)
Patient is awake, A&OX3-4 with intermittent confusion.  Pt denies pain and is easily reoriented.  Pt's daughter at bedside, updated with POC.  Patient complaining of IV site discomfort with Potassium replacement.  MD notified, received order for addition oral replacement and hold off on second bag of K-rider.   Will continue to monitor, falls prevention and safety, pain management, assist OOB.   Problem: Compromised Moisture  Goal: Moisture level Interventions  Outcome: Progressing  Flowsheets (Taken 02/12/2023 0800)  Moisture level Interventions: Moisture wicking products, Moisture barrier cream     Problem: Compromised Activity/Mobility  Goal: Activity/Mobility Interventions  Outcome: Progressing  Flowsheets (Taken 02/12/2023 0800)  Activity/Mobility Interventions: Pad bony prominences, TAP Seated positioning system when OOB, Promote PMP, Reposition q 2 hrs / turn clock, Offload heels     Problem: Compromised Friction/Shear  Goal: Friction and Shear Interventions  Outcome: Progressing  Flowsheets (Taken 02/12/2023 0800)  Friction and Shear Interventions: Pad bony prominences, Off load heels, HOB 30 degrees or less unless contraindicated, Consider: TAP seated positioning, Heel foams     Problem: Moderate/High Fall Risk Score >5  Goal: Patient will remain free of falls  Outcome: Progressing  Flowsheets (Taken 02/12/2023 0800)  Moderate Risk (6-13):   MOD-include family in multidisciplinary POC discussions   MOD-Request PT/OT consult order for patients with gait/mobility impairment   MOD-Perform dangle, stand, walk (DSW) prior to mobilization   MOD-Place bedside commode and assistive devices out of sight when not in use   MOD-Remain with patient during toileting   MOD-Apply bed exit alarm if patient is confused   LOW-Fall Interventions Appropriate for Low Fall Risk   MOD-Floor mat at bedside (where available) if appropriate     Problem: Safety  Goal: Patient will be free from injury during hospitalization  Outcome:  Progressing  Flowsheets (Taken 02/11/2023 2007 by Luz Brazen, RN)  Patient will be free from injury during hospitalization:   Assess patient's risk for falls and implement fall prevention plan of care per policy   Use appropriate transfer methods   Ensure appropriate safety devices are available at the bedside   Include patient/ family/ care giver in decisions related to safety   Assess for patients risk for elopement and implement Elopement Risk Plan per policy   Provide alternative method of communication if needed (communication boards, writing)   Hourly rounding   Provide and maintain safe environment  Goal: Patient will be free from infection during hospitalization  Outcome: Progressing  Flowsheets (Taken 02/11/2023 2007 by Luz Brazen, RN)  Free from Infection during hospitalization:   Assess and monitor for signs and symptoms of infection   Monitor all insertion sites (i.e. indwelling lines, tubes, urinary catheters, and drains)   Encourage patient and family to use good hand hygiene technique   Monitor lab/diagnostic results     Problem: Pain  Goal: Pain at adequate level as identified by patient  Outcome: Progressing  Flowsheets (Taken 02/11/2023 1632 by Luz Brazen, RN)  Pain at adequate level as identified by patient:   Reassess pain within 30-60 minutes of any procedure/intervention, per Pain Assessment, Intervention, Reassessment (AIR) Cycle   Offer non-pharmacological pain management interventions     Problem: Fluid and Electrolyte Imbalance/ Endocrine  Goal: Fluid and electrolyte balance are achieved/maintained  Outcome: Progressing  Flowsheets (Taken 02/11/2023 2007 by Luz Brazen, RN)  Fluid and electrolyte balance are achieved/maintained:   Observe for cardiac arrhythmias   Monitor for muscle weakness   Monitor/assess lab values and report abnormal values  Assess and reassess fluid and electrolyte status  Goal: Adequate hydration  Outcome: Progressing  Flowsheets (Taken 02/11/2023 2007 by  Luz Brazen, RN)  Adequate hydration:   Assess mucus membranes, skin color, turgor, perfusion and presence of edema   Assess for peripheral, sacral, periorbital and abdominal edema   Monitor and assess vital signs and perfusion     Problem: Inadequate Gas Exchange  Goal: Adequate oxygenation and improved ventilation  Outcome: Progressing  Flowsheets (Taken 02/12/2023 0127 by Adrian Prows, RN)  Adequate oxygenation and improved ventilation:   Assess lung sounds   Monitor SpO2 and treat as needed   Provide mechanical and oxygen support to facilitate gas exchange   Position for maximum ventilatory efficiency   Teach/reinforce use of incentive spirometer 10 times per hour while awake, cough and deep breath as needed   Plan activities to conserve energy: plan rest periods   Increase activity as tolerated/progressive mobility  Goal: Patent Airway maintained  Outcome: Progressing  Flowsheets (Taken 02/11/2023 2007 by Luz Brazen, RN)  Patent airway maintained:   Position patient for maximum ventilatory efficiency   Suction secretions as needed   Reinforce use of ordered respiratory interventions (i.e. CPAP, BiPAP, Incentive Spirometer, Acapella, etc.)   Reposition patient every 2 hours and as needed unless able to self-reposition   Provide adequate fluid intake to liquefy secretions     Problem: Inadequate Cardiac Output  Goal: Adequate tissue perfusion will be maintained  Outcome: Progressing  Flowsheets (Taken 02/12/2023 0128 by Adrian Prows, RN)  Adequate tissue perfusion will be maintained:   Monitor/assess lab values and report abnormal values   Encourage/assist patient as needed to turn, cough, and perform deep breathing every 2 hours     Problem: Infection  Goal: Free from infection  Outcome: Progressing  Flowsheets (Taken 02/12/2023 0128 by Adrian Prows, RN)  Free from infection:   Utilize isolation precautions per protocol/policy   Assess for signs/symptoms of infection

## 2023-02-13 LAB — BASIC METABOLIC PANEL
Anion Gap: 8 (ref 5.0–15.0)
BUN: 18 mg/dL (ref 7–21)
CO2: 24 mEq/L (ref 17–29)
Calcium: 8.9 mg/dL (ref 7.9–10.2)
Chloride: 102 mEq/L (ref 99–111)
Creatinine: 0.7 mg/dL (ref 0.4–1.0)
GFR: >60 mL/min/{1.73_m2} (ref >=60.0)
Glucose: 154 mg/dL — ABNORMAL HIGH (ref 70–100)
Potassium: 4.5 mEq/L (ref 3.5–5.3)
Sodium: 134 mEq/L — ABNORMAL LOW (ref 135–145)

## 2023-02-13 LAB — CBC
Absolute nRBC: 0 10*3/uL (ref <=0.00)
Hematocrit: 34.8 % (ref 34.7–43.7)
Hemoglobin: 12 g/dL (ref 11.4–14.8)
MCH: 29.6 pg (ref 25.1–33.5)
MCHC: 34.5 g/dL (ref 31.5–35.8)
MCV: 85.9 fL (ref 78.0–96.0)
MPV: 8.8 fL — ABNORMAL LOW (ref 8.9–12.5)
Platelet Count: 293 10*3/uL (ref 142–346)
RBC: 4.05 10*6/uL (ref 3.90–5.10)
RDW: 14 % (ref 11–15)
WBC: 8.82 10*3/uL (ref 3.10–9.50)
nRBC %: 0 /100 WBC (ref <=0.0)

## 2023-02-13 LAB — MAGNESIUM: Magnesium: 2.1 mg/dL (ref 1.6–2.6)

## 2023-02-13 MED ORDER — GUAIFENESIN ER 600 MG PO TB12
600.0000 mg | ORAL_TABLET | Freq: Two times a day (BID) | ORAL | 0 refills | Status: AC
Start: 2023-02-13 — End: ?

## 2023-02-13 MED ORDER — METOPROLOL SUCCINATE ER 50 MG PO TB24
100.0000 mg | ORAL_TABLET | Freq: Every day | ORAL | Status: DC
Start: 2023-02-14 — End: 2023-02-13

## 2023-02-13 NOTE — Consults (Signed)
[facility] Note  Home Health Referral    Referral from [facility] (Case Manager) for home health care upon discharge.    By [employer], the patient has the right to freely choose a home care provider.    A company of the patients choosing. We have supplied the patient with a listing of providers in your area who asked to be included and participate in Medicare.   [facility] a home care agency that provides adult home care services and participates in Medicare   The preferred provider of your insurance company. Choosing a home care provider other than your insurance company's preferred provider may affect your insurance coverage.      Home Health Discharge Information    Your doctor has ordered Physical Therapy and Occupational Therapy in-home service(s) for you while you recuperate at home, to assist you in the transition from hospital to home.    The agency that you or your representative chose to provide the service:  Name of Home Health Agency Placement: [facility]]  Phone: [phone]     The Medical Equipment Company:   [employer]]  Equipment Ordered: Dan Humphreys        The above services were set up by:  Cynthia Belling, RN (Home Health Liaison)   Phone: [phone]        IF YOU HAVE NOT HEARD FROM YOUR HOME HEALTH AGENCY WITHIN 24-48 HOURS AFTER DISCHARGE PLEASE CALL YOUR AGENCY TO ARRANGE A TIME FOR YOUR FIRST VISIT. FOR ANY SCHEDULING CONCERNS OR QUESTIONS RELATED TO HOME HEALTH, SUCH AS TIME OR DATE PLEASE CONTACT YOUR HOME HEALTH AGENCY AT THE NUMBER LISTED ABOVE.    Additional comments:        START PATIENT REGISTRATION INFORMATION     Order Information  Order Signing Physician: Cynthia Kim    Service Ordered RN ?: No    Service Ordered PT ?: Yes  Service Ordered OT ?: Yes  Service Ordered ST ?: No    Service Ordered MSW?: No    Service Ordered HHA?: No    Following Physician: Cynthia Pedro, Kim   Following Physician Phone: [phone]   Overseeing Physician:  N/A  (Required for Residents only)   Agreeable to Follow?: N/A  Spoke with: N/A  Date/Time of Call: 02/13/23 12:52 PM      Care Coordination   [facility] Call from [facility] Required?: no  [facility]?: no  Primary Care Physician:Cynthia Maudie Flakes, Kim  Primary Care Physician [phone]  Primary Care Physician Address: [address] / [place] [address]  PCP NPI: [id number]  Visit Instructions: N/A  Service Discharge Location Type: Home  Service Facility Name: N/A  Service Floor Facility: N/A  Service Room No: N/A    Demographics  Patient Last Name: Cynthia Kim   Patient First Name: Cynthia Kim  Language/Communication Barrier: no  Service Address: [address]  [place] [address]   Service Home Phone: [phone] (home)   Other phone numbers:    Telephone Information:   Mobile [phone]     Emergency Contact: Extended Emergency Contact Information  Primary Emergency Contact: Kim,Cynthia  Address: [address]           [place], [place] [address] [place]  Home Phone: [phone]  Mobile Phone: [phone]  Relation: Spouse  Secondary Emergency Contact: Kim,Cynthia  Mobile Phone: [phone]  Relation: Daughter    Admission Information  Admit Date: 02/11/2023  Patient Status at discharge:  Inpatient  Admitting Diagnosis: COVID [U07.1]  COVID-19 [U07.1]     Caregiver Information  Caregiver First Name: N/A  Caregiver Last Name: N/A  Caregiver Relationship to Patient: Other  Caregiver Phone Number: N/A  Caregiver Notes: N/A            Data processing manager Information  Primary Subscriber:   Primary Subscriber Relation To Guarantor:   Primary Payor:   Primary Plan:   Primary Group #:    Primary Subscriber ID:    Primary Subscriber DOB:   Secondary Insurance Information  Secondary Subscriber:   Secondary Subscriber Relation To Guarantor:   Secondary Payor:   Secondary Plan:   Secondary Group #:   Secondary Subscriber ID:   Secondary Subscriber DOB:   HITECH  NO      END PATIENT REGISTRATION  INFORMATION       Diagnosis: COVID [U07.1]  COVID-19 [U07.1]    [facility] Summary         Home Health face-to-face (FTF) Encounter (Order [id number])  Consult  Date: 02/13/2023 Department: [facility] [address] Ordering/Authorizing: Cynthia Kim     Order Information    Order Date/Time Release Date/Time Start Date/Time End Date/Time   02/13/23 12:03 PM None 02/13/23 12:03 PM 02/13/23 12:03 PM     Order Details    Frequency Duration Priority Order Class   Once 1  occurrence Routine Hospital Performed     Standing Order Information    Remaining Occurrences Interval Last Released     0/1 Once 02/13/2023              Provider Information    Ordering User Ordering Provider Authorizing Provider   Dalphine Handing, , RN Cynthia Kim Cynthia Kim   Attending Provider(s) Admitting Provider PCP   Cynthia Grooms, Erasmo Downer, Kim; Cynthia Kim Cynthia Grooms, Erasmo Downer, Kim Cynthia Pedro, Kim     Verbal Order Info    Action Created on Order Mode Entered by Responsible Provider Signed by Signed on   Ordering 02/13/23 1203 Per protocol: cosign required Dalphine Handing, Kathrynn Speed, RN Cynthia Kim Cynthia Kim 02/13/23 1223           Comments    Provider to follow: Cynthia Pedro, Kim -- [phone]    Home PT/OT required for gait and balance training, strengthening, mobility, fall prevention, and ADL training.    Front Wheel walker needed >99 months, NPI: [id number]    Weight: 54.6 kg (120 lb 5.9 oz) (02/13/23 0822)    COVID [U07.1]  COVID-19 [U07.1]                Home Health face-to-face (FTF) Encounter: Patient Communication     Not Released  Not seen         Order Questions    Question Answer   Date I saw the patient face-to-face: 02/13/2023   Evidence this patient is homebound because: C.  Decreased endurance, strength, ROM, cadence, safety/judgment during mobility    B.  Profound weakness, poor balance/unsteady gait d/t illness/treatment/procedure    I.  Restricted to home to  decrease risk of infection   Medical conditions that necessitate Home Health care: C.  Risk for complication/infection/pain requiring follow up and monitoring    D.  Chronic illness & risk for re-hospitalization due to unstable disease status   Per clinical findings, following services are medically necessary: PT  OT    DME   Clinical findings that support the need for Physical Therapy. PT will A.  Evaluate and treat functional impairment and improve mobility    B.  Educate on post-surgical care, restrictions and management of complications    C.  Educate on weight bearing status, stair/gait training, balance & coordination    D.  Provide services to help restore function, mobility, and releive pain    F.  Perform home safety assessment & develop safe in home exercise program    E.  Educate on functional mobility; bed, chair, sit, stand and transfer activities    G.  Implement activities to improve stance time, cadence & step length    H.  Educate on the safe use of assistive device/ durable medical equipment    I.  Instruct on restorative activities to restore ability to perform ADL    J.  Educate on wheelchair mobility, ramp negotiation, and therapeutic exercises   OT will provide assistance with: Home program to improve ability to perform ADLs    Recovery and maintenance skills    Basic motor function and reasoning abilities    Strategies to compensate for loss of function    Restorative program to improve mobility and independence   DME Walker                    Process Instructions    Please select Home Care Services medically necessary.    Based on the above findings, I certify that this patient is confined to the home and needs intermittent skilled nursing care, physical therapry and / or speech therapy or continues to need occupational therapy. The patient is under my care, and I have initiated the establishment of the plan of care. This patient will be followed by a physician who will periodically review  the plan of care.     Collection Information            Consult Order Info    ID Description Priority Start Date Start Time   [id number] Home Health face-to-face (FTF) Encounter Routine 02/13/2023 12:03 PM   Provider Specialty Referred to   ______________________________________ _____________________________________         Acknowledgement Info    For At Acknowledged By Acknowledged On   Placing Order 02/13/23 1203 Sanjuana Kava, RN 02/13/23 1221                     Verbal Order Info    Action Created on Order Mode Entered by Responsible Provider Signed by Signed on   Ordering 02/13/23 1203 Per protocol: cosign required Dalphine Handing, Kathrynn Speed, RN Cynthia Kim Cynthia Kim 02/13/23 1223           Patient Information    Patient Name  Cynthia Kim Legal Sex  Female DOB  [birth year]       Reprint Order Requisition    Home Health face-to-face (FTF) Encounter (Order #[id number]) on 02/13/23       Additional Information    Associated Reports External References   Priority and Order Details [employer]            [facility]        Patient Name: Cynthia Kim, Cynthia Kim     MRN: [id number]      CSN: [id number]         Account Information     Hosp Acct #   [id number] Patient Class  Inpatient Service  Medicine Accommodation Code  Semi-Private      Admission Information     Admitting Physician:  Attending Physician: Cynthia Grooms, Ardell Isaacs, Kim [address] L&D Status      Admitting Diagnosis: COVID; COVID-19 Room / Bed  A2114/A2114-01 L&D - Last Menstrual Cycle      Chief Complaint:       Admit Type:  Admit Date/Time:  Discharge Date/Time: Urgent  02/11/2023 / 1556   /  Length of Stay: 2 Days    L&D EDD   Estimated Date of Delivery: None noted.      Patient Information              Home Address: [address]  [place] [address] Employer:  Employer Address:       ,     Main Phone: [phone] Employer Phone:     SSN: [id number]       DOB: Aug 14, 1931 (89 yrs)       Sex: Female Primary Care  Physician: Cynthia Pedro, Kim   Marital Status: Married Referring Physician:       Randol Kern, Kim   Race: [place] or Caucasian       Ethnicity: Not of Hispanic/Latino/S*       Emergency Contacts  Name Home Phone Work Phone Mobile Phone Relationship MICHEL, ESKELSON [phone]   [phone] Spouse     Jabier Mutton     [phone] Daughter           Guarantor Information     Guarantor Name: GINIA, RUDELL Guarantor ID: [id number]   Guarantor Relationship to Pt: Self Guarantor Type: Personal/Family   Guarantor DOB:    11/23/1931 Billing Indicator:        Guarantor Address: [address] [place], [place] [address]          Guarantor Home Phone: [phone] Guarantor Employer:        Guarantor Work Phone:   Pensions consultant Emp Phone:                     Chief Strategy Officer Name: [employer] Subscriber Name: [employer]   Insurance Address:    [address]  [place] [address] Subscriber DOB: May 31, 1932      Subscriber ID: [id number]   Insurance Phone:   Pt Relationship to Sub:   Self   Insurance ID:         Group Name:   Preauthorization #: NPR   Group #:   Preauthorization Days:        Secondary Insurance     Insurance Name: Cornelious Bryant PPO Subscriber Name: [employer]   Insurance Address:    [address] [address]  [place], [place] [address] Subscriber DOB: 12/30/1931      Subscriber ID: [id number]   Insurance Phone:   Pt Relationship to Sub:   Self   Insurance ID:         Group Name:   Preauthorization #:     Group #: [id number] Preauthorization Days:        [place] [employer] Name: [facility] Subscriber Name: [facility]   Insurance Address:    [address]  [place], [place] [address] Subscriber DOB: 04/10/1934      Subscriber ID: [id number]   Insurance Phone: [phone] Pt Relationship to Sub:   Spouse   Insurance ID:  Group Name:   Preauthorization #:     Group #:   Preauthorization Days:           02/13/2023 12:54 PM          Additional Comments:     End [facility] Summary     Discharge Date:  02/13/2023    Referral Source  Signed by: Cynthia Belling, RN  Date Time: 02/13/23 12:52 PM      End [facility] Note

## 2023-02-13 NOTE — Discharge Instr - AVS First Page (Addendum)
[facility] DISCHARGE INSTRUCTIONS     Date of Admission: 02/11/2023    Date of Discharge: 02/13/2023    Discharge Physician: Elisha Headland, MD    Dear Cynthia Kim,     Thank you for choosing [facility] for your emergency care needs. We strive to provide EXCELLENT care to you and your family.     In an effort to explain clearly why you were here in the hospital, I've written a very brief summary. I hope that you find it useful. Other details including formal diagnosis, medication changes, follow up appointment recommendations, and access to MyChart for formal medical records can be found in this packet.       You were admitted for COVID-19 for which you were initially started on supplemental oxygen, steroids, and remdesivir. Your oxygen was able to be weaned off and you did not have any issues with low oxygen on day of discharge. You received 2 days of remdesivir. You do not need to resume Paxlovid on discharge.     Make sure to follow up with our [facility] or Cynthia Pedro, MD your primary care doctor for follow-up. I cannot stress the importance of follow up enough.    Make sure to bring the following to your doctors appointments:  Medications in their original bottles  Glucometer/blood sugar log (if diabetic)   Weight log (if you have heart failure)    If you are unable to obtain an appointment, unable to obtain newly prescribed medications, or are unclear about any of your discharge instructions please contact me at [phone] (M-F, 8am-3pm) or weekends and after hours via the hospital operator [phone], the hospital case manager, or your primary care physician.    Finally, as your discharging physician, you may be receiving a survey which is regarding my care. I would greatly value and appreciate your feedback as I strive for excellence.     Respectfully yours,    Elisha Headland, MD    Consultants: None    Pending Studies: None    Further Instructions:     If you  need any further refills, please discuss with your primary care doctor or specialist.     Discharge Diet: Regular diet    Surgery/Procedure: None          Home Health Discharge Information    Your doctor has ordered Physical Therapy and Occupational Therapy in-home service(s) for you while you recuperate at home, to assist you in the transition from hospital to home.    The agency that you or your representative chose to provide the service:  Name of Home Health Agency Placement: [facility]]  Phone: [phone]     The Medical Equipment Company:   [employer]]  Equipment Ordered: Dan Humphreys        The above services were set up by:  Romero Belling, RN (Home Health Liaison)   Phone: [phone]        IF YOU HAVE NOT HEARD FROM YOUR HOME HEALTH AGENCY WITHIN 24-48 HOURS AFTER DISCHARGE PLEASE CALL YOUR AGENCY TO ARRANGE A TIME FOR YOUR FIRST VISIT. FOR ANY SCHEDULING CONCERNS OR QUESTIONS RELATED TO HOME HEALTH, SUCH AS TIME OR DATE PLEASE CONTACT YOUR HOME HEALTH AGENCY AT THE NUMBER LISTED ABOVE.

## 2023-02-13 NOTE — Progress Notes (Signed)
Pt's daughter requested for mother to not be waken up for am lab and vitals d/t AMS episode previous night when labs and vitals was attempted. Will relay to day team.

## 2023-02-13 NOTE — Plan of Care (Signed)
NURSING SHIFT NOTE     Patient: Cynthia Kim  Day: 2      SHIFT EVENTS     Shift Narrative/Significant Events (PRN med administration, fall, RRT, etc.):   Patient is alert and oriented x 4, no c/o pain or shortness of breath. A. Fib on the monitor. HR controlled. Plan of care discussed with patient and daughter. Safety and fall precautions remain in place. Purposeful rounding completed.          ASSESSMENT     Changes in assessment from patient's baseline this shift:    Neuro: No  CV: No  Pulm: No  Peripheral Vascular: No  HEENT: No  GI: No  BM during shift: No, Last BM:    GU: No   Integ: No  MS: No    Pain: None  Pain Interventions:   Medications Utilized:     Mobility: PMP Activity: Step 6 - Walks in Room of Distance Walked (ft) (Step 6,7): 5 Feet           Lines     Patient Lines/Drains/Airways Status       Active Lines, Drains and Airways       Name Placement date Placement time Site Days    Peripheral IV 02/11/23 20 G Standard Anterior;Left;Proximal Forearm 02/11/23  1600  Forearm  1    External Urinary Catheter 02/12/23  2153  --  less than 1                         VITAL SIGNS     Vitals:    02/13/23 0822   BP: 169/81   Pulse: 87   Resp: 19   Temp: 97.7 F (36.5 C)   SpO2: 94%       Temp  Min: 97.3 F (36.3 C)  Max: 97.7 F (36.5 C)  Pulse  Min: 72  Max: 117  Resp  Min: 18  Max: 20  BP  Min: 145/87  Max: 169/81  SpO2  Min: 94 %  Max: 96 %      Intake/Output Summary (Last 24 hours) at 02/13/2023 0844  Last data filed at 02/13/2023 0000  Gross per 24 hour   Intake 200 ml   Output 1000 ml   Net -800 ml            CARE PLAN       Problem: Compromised Moisture  Goal: Moisture level Interventions  Outcome: Progressing     Problem: Compromised Activity/Mobility  Goal: Activity/Mobility Interventions  Outcome: Progressing     Problem: Safety  Goal: Patient will be free from injury during hospitalization  Outcome: Progressing     Problem: Hemodynamic Status: Cardiac  Goal: Stable vital signs and fluid  balance  Outcome: Progressing

## 2023-02-13 NOTE — Discharge Summary (Signed)
[facility]      Patient: Roddie Mc  Admission Date: 02/11/2023   DOB: 1931/09/26  Discharge Date: 02/13/2023    MRN: [id number]  Discharge Attending: Rodman Comp, MD     Referring Physician: Hayden Pedro, MD  PCP: Hayden Pedro, MD       DISCHARGE SUMMARY     Discharge Information   Admission Diagnosis:   COVID-19    Discharge Diagnosis:   Active Hospital Problems    Diagnosis    COVID-19      Admission Condition: Confusion, COVID-19 infection  Discharge Condition: Improved, stable  Consultants: None  Functional Status: Good  Discharged to: Home with home PT/OT       Hospital Course   Presentation History   Per admission H&P:  Dauna Ziska is a 89 y.o. female with a PMHx of atrial fibrillation only on aspirin who presented with generalized weakness and altered mental status.  Patient husband was diagnosed with COVID last Sunday in the ED and was sent home medications.  Last Tuesday, patient developed fever, chills, cough.  Her symptoms worsened and she had nonbloody diarrhea, generalized weakness and altered mental status.  No nausea.  No vomiting.  No abdominal pain.  Her daughter brought her to the ER this morning because she looked worse and dehydrated.  In ED, patient was febrile temp 100.9, tachycardic HR 118, hypertensive BP 160/105 and hypoxemic sats 91% on room air.   She was placed on 2 L of oxygen via nasal cannula with sats 96%. WBC 5.  Sodium 131.  Chloride 98.  Lactic acid 1.4.  LFTs WNL.  COVID-positive.  Influenza negative.  Chest x-ray with interstitial edema, bibasilar atelectasis.     Hospital Course (2 Days)   Acute hypoxic respiratory failure, resolved  COVID-19 infection  Patient presenting for evaluation of increased confusion.  Reports mainly upper respiratory symptoms, denies dyspnea on exertion prior to admission. Chest x-ray with cardiomegaly and pulmonary edema.  NT proBNP 5329, however patient does not appear volume overloaded on examination.  Patient was initially started on  2 L, then quickly weaned to room air.  She received remdesivir and dexamethasone during admission.  - Continue symptomatic management as needed  - No need to resume Paxlovid on discharge  - Home PT/OT     Acute metabolic encephalopathy  Most likely secondary to above COVID-19 infection.  At risk of sundowning.  - Mental status much improved after improved night's sleep     Atrial fibrillation with RVR, resolved  Resume home metoprolol 100 mg, aspirin and digoxin  Patient not on anticoagulation  - Outpatient follow-up with PCP     Debility  Ambulatory dysfunction  - PT/OT recommending home with supervision/home with home PT/OT         Progress Note/Physical Exam at Discharge     Subjective:Patient is stable for discharge.  Patient seen in the morning, sitting up in chair, states feeling much improved compared to admission.  Plan of care discussed with patient and daughter at bedside.  Plan for discharge today.    Vitals:    02/13/23 1029 02/13/23 1156 02/13/23 1520 02/13/23 1527   BP: 138/69 128/71 166/73 (!) 143/91   Pulse: 100 71 85 (!) 50   Resp:       Temp:   97.5 F (36.4 C)    TempSrc:   Oral    SpO2:  96% 98%    Weight:       Height:  General: NAD, AAOx3  Cardiovascular: RRR  Lungs: CTABL  Abdomen: Soft, +BS, NT/ND  Extremities: No edema       Diagnostics     Labs/Studies Pending at Discharge: No    Last Labs   Recent Labs   Lab 02/13/23  0817 02/12/23  0539 02/11/23  0729   WBC 8.82 2.42* 5.53   RBC 4.05 4.34 3.99   Hemoglobin 12.0 12.4 11.5   Hematocrit 34.8 36.7 34.2*   MCV 85.9 84.6 85.7   Platelet Count 293 263 235       Recent Labs   Lab 02/13/23  0817 02/12/23  0539 02/11/23  1810 02/11/23  0729   Sodium 134* 134*  --  131*   Potassium 4.5 3.3*  --  3.8   Chloride 102 98*  --  98*   CO2 24 24  --  22   BUN 18 12  --  10   Creatinine 0.7 0.7  --  0.7   Glucose 154* 177*  --  120*   Calcium 8.9 9.1  --  9.0   Magnesium 2.1 1.8 1.6 1.6       Microbiology Results (last 15 days)       Procedure  Component Value Units Date/Time    COVID-19 and Influenza ([place]) (symptomatic) [[id number]]  (Abnormal) Collected: 02/11/23 0731    Order Status: Completed Specimen: Swab from Anterior Nares Updated: 02/11/23 0811     SARS-CoV-2 (COVID-19) RNA Detected     Influenza A RNA Not Detected     Influenza B RNA Not Detected    Narrative:      A result of "Detected" indicates POSITIVE for the presence of viral RNA  A result of "Not Detected" indicates NEGATIVE for the presence of viral RNA    Test performed using the Roche cobas [place] SARS-CoV-2 & Influenza A/B assay. This is a multiplex real-time RT-PCR assay for the detection of SARS-CoV-2, influenza A, and influenza B virus RNA. Viral nucleic acids may persist in vivo, independent of viability. Detection of viral nucleic acid does not imply the presence of infectious virus, or that virus nucleic acid is the cause of clinical symptoms. Negative results do not preclude SARS-CoV-2, influenza A, and/or influenza B infection and should not be used as the sole basis for diagnosis, treatment or other patient management decisions. Invalid results may be due to inhibiting substances in the specimen and recollection should occur.              Patient Instructions   Discharge Diet: regular diet  Discharge Activity: as tolerated    Follow Up Appointment:   Follow-up Information       Hayden Pedro, MD. Schedule an appointment as soon as possible for a visit in 1 week(s).    Specialty: Internal Medicine  Why: For hospital follow-up  Contact information:  [address]  [place] [address]  [phone]               [facility] Follow up.    Specialty: Home Health Services  Contact information:  [address]  [place] [address]  [phone]                            Discharge Medications:     Medication List        START taking these medications      guaiFENesin 600 MG 12 hr tablet  Commonly known as: MUCINEX  Take 1 tablet (600 mg) by mouth every 12  (twelve) hours  Notes to patient: Take next at 02/13/23 at 9pm            CONTINUE taking these medications      aspirin EC 325 MG tablet  Notes to patient: Take next at 02/14/23 at 9am     digoxin 0.125 MG tablet  Commonly known as: LANOXIN  TAKE 1 TABLET DAILY  Notes to patient: Take next at 02/14/23 at 9am     fluocinonide 0.05 % external solution  Commonly known as: LIDEX  Notes to patient: Use as needed.      meclizine 25 MG tablet  Commonly known as: ANTIVERT  Take 1 tablet (25 mg) by mouth 3 (three) times daily as needed for Dizziness  Notes to patient: Use as needed     metoprolol succinate 100 MG 24 hr tablet  Commonly known as: TOPROL-XL  Take 1 tablet (100 mg total) by mouth daily.  Notes to patient: Take next at 02/14/23 at 9am     sertraline 25 MG tablet  Commonly known as: ZOLOFT  Notes to patient: Take next at 02/14/23 at 9am     vitamin D 1000 UNIT tablet  Commonly known as: cholecalciferol  Notes to patient: Take next at 02/14/23 at 9am            STOP taking these medications      PAXLOVID (300/100) PO               Where to Get Your Medications        These medications were sent to [id number] - [place], [place] - [address]  [address] [place] [address]      Phone: [phone]   guaiFENesin 600 MG 12 hr tablet          Time spent examining patient, discussing with patient/family regarding hospital course, chart review, reconciling medications and discharge planning: 31 minutes.    Signed,  Elisha Headland, MD    3:29 PM 02/13/2023

## 2023-02-13 NOTE — Progress Notes (Signed)
02/13/23 1356   Discharge Disposition   Patient preference/choice provided? Yes   Physical Discharge Disposition Home, Home Health   Mode of Transportation Car   Patient/Family/POA notified of transfer plan Yes   Patient agreeable to discharge plan/expected d/c date? Yes   Family/POA agreeable to discharge plan/expected d/c date? Yes   Bedside nurse notified of transport plan? Yes   Outpatient Services   Home Health Home PT/OT/ST   CM Interventions   Multidisciplinary rounds/family meeting before d/c? Yes   Medicare Checklist   Is this a Medicare patient? Yes   Patient received 1st IMM Letter? n/a         Cheri Rous, MSW  [phone]

## 2023-02-13 NOTE — Progress Notes (Signed)
Pt is medically cleared for discharge. DCP Home with HH PT. PT recommendations for a FWW, DME delivered to pts room. Dtr at bedside and will transport pt home. Pt has no additional CM needs.     Cheri Rous, MSW  [phone]

## 2023-02-13 NOTE — Discharge Instructions (Signed)
Name of Home Health Agency Placement:   [facility]  Phone: [phone]

## 2023-02-13 NOTE — Progress Notes (Signed)
Patient discharged to home via wheelchair accompanied by hospital transport team and daughter with all the belongings pt came with. Discharge instructions and follow up plan explained to pt, and she and daughter demonstrated understanding, no further questions at this time. Verbalized understanding.  IV line removed, VSS. Pt discharged with walker for home. Home health referral along with PT/OT in place.  Latest VS  BP 143/91   HR 50  98% O2 on RA

## 2023-02-15 NOTE — Progress Notes (Signed)
AMBULATORY CARE MANAGEMENT Outreach Call    Hospital patient admitted to: [facility] Admission/Discharge Dates:  8/2- 8/4      Reason for admission to hospital: COVID    Name: Cynthia Kim    ### Patient Details  Date of Birth: 1932/03/21  MRN: [id number]    ### Encounter Details  Arrival Date: 02/11/2023 03:56 PM EDT  Discharge Date: 02/13/2023 03:37 PM EDT  Encounter ID: [id number] TCM8/10/2022   3:37:00PM    ### Related interaction  [facility] - TCM V2 Post Discharge Outreach (Post Discharge TCM V2 Outreach 1) ([email or web address])    ### Required Interventions and Feedback     Call Status         Call Status:     No Attempt (edited by JF on 02/15/2023 08:37 AM EDT)    Comments::     Patient received an automated post-discharge outreach call.  Person accepting call hung up.   No further action needed at this time.   (edited by JF on 02/15/2023 08:38 AM EDT)      Rudolpho Sevin, RN, BSN, ACM-RN  Case Manager II  [facility]  [address]  [place], [place] [address]  (T) [phone]

## 2023-05-10 ENCOUNTER — Ambulatory Visit (INDEPENDENT_AMBULATORY_CARE_PROVIDER_SITE_OTHER): Payer: Medicare Other | Admitting: Cardiovascular Disease

## 2023-05-11 ENCOUNTER — Encounter (INDEPENDENT_AMBULATORY_CARE_PROVIDER_SITE_OTHER): Payer: Self-pay | Admitting: Cardiovascular Disease

## 2023-05-11 ENCOUNTER — Ambulatory Visit (INDEPENDENT_AMBULATORY_CARE_PROVIDER_SITE_OTHER): Payer: Medicare Other | Admitting: Cardiovascular Disease

## 2023-05-11 ENCOUNTER — Ambulatory Visit (INDEPENDENT_AMBULATORY_CARE_PROVIDER_SITE_OTHER): Payer: Medicare Other

## 2023-05-11 DIAGNOSIS — I4811 Longstanding persistent atrial fibrillation: Secondary | ICD-10-CM

## 2023-05-11 NOTE — Progress Notes (Signed)
 [place] [facility] OFF[facility] PROGRESS NOTE    [facility] [place] OFFICE -CARDIOLOGY  [address]  [place] [address]  Dept: [phone]  Dept Fax: [phone]       Patient Name: Cynthia Kim, Cynthia Kim

## 2023-05-16 LAB — ECG 12-LEAD
Q-T Interval: 296 ms
QRS Duration: 90 ms
QTC Calculation (Bezet): 371 ms
R Axis: 63 degrees
T Axis: -73 degrees
Ventricular Rate: 95 {beats}/min

## 2023-05-16 NOTE — Addendum Note (Signed)
 Addended by: Loyal Gambler on: 05/16/2023 11:38 AM     Modules accepted: Orders

## 2023-05-23 ENCOUNTER — Encounter (INDEPENDENT_AMBULATORY_CARE_PROVIDER_SITE_OTHER): Payer: Self-pay

## 2023-08-03 DIAGNOSIS — I4811 Longstanding persistent atrial fibrillation: Secondary | ICD-10-CM

## 2023-08-03 LAB — HOLTER MONITOR
# of A-fib episodes: 5
# of PACs: 0
# of PVC beats in couplets: 112
# of PVCs: 15996
# of SVT episodes: 0
# of VT episodes: 1
# of pauses: 1
% Noise burden: 7.33 %
% PACs (burden): 0 %
% PVCs (burden): 2.13 %
Average HR: 80 {beats}/min
Longest R-R interval (pause): 2.63 s
Longest VT Episode: 0.79 s
Max HR: 119 {beats}/min
Min HR: 62 {beats}/min
Time in A-fib (burden): 100 %

## 2023-08-19 ENCOUNTER — Encounter (INDEPENDENT_AMBULATORY_CARE_PROVIDER_SITE_OTHER): Payer: Self-pay

## 2023-09-22 ENCOUNTER — Other Ambulatory Visit (INDEPENDENT_AMBULATORY_CARE_PROVIDER_SITE_OTHER): Payer: Self-pay | Admitting: Cardiovascular Disease

## 2024-03-28 ENCOUNTER — Other Ambulatory Visit: Payer: Self-pay | Admitting: Obstetrics & Gynecology
